# Patient Record
Sex: Male | Born: 1985 | Race: White | Hispanic: No | Marital: Single | Smoking: Former smoker
Health system: Southern US, Community
[De-identification: ages and names within clinical notes are randomized; demographics above are authoritative.]

## PROBLEM LIST (undated history)

## (undated) DIAGNOSIS — S22080A Wedge compression fracture of T11-T12 vertebra, initial encounter for closed fracture: Secondary | ICD-10-CM

## (undated) DIAGNOSIS — I10 Essential (primary) hypertension: Secondary | ICD-10-CM

## (undated) DIAGNOSIS — F329 Major depressive disorder, single episode, unspecified: Secondary | ICD-10-CM

## (undated) DIAGNOSIS — F32A Depression, unspecified: Secondary | ICD-10-CM

---

## 2006-04-18 DIAGNOSIS — S22080A Wedge compression fracture of T11-T12 vertebra, initial encounter for closed fracture: Secondary | ICD-10-CM

## 2006-04-18 HISTORY — DX: Wedge compression fracture of T11-T12 vertebra, initial encounter for closed fracture: S22.080A

## 2015-02-11 ENCOUNTER — Encounter (HOSPITAL_COMMUNITY): Payer: Self-pay | Admitting: Emergency Medicine

## 2015-02-11 ENCOUNTER — Emergency Department (HOSPITAL_COMMUNITY)
Admission: EM | Admit: 2015-02-11 | Discharge: 2015-02-11 | Disposition: A | Discharge status: Home / Self Care | Payer: Self-pay | Attending: Emergency Medicine | Admitting: Emergency Medicine

## 2015-02-11 DIAGNOSIS — I10 Essential (primary) hypertension: Secondary | ICD-10-CM | POA: Insufficient documentation

## 2015-02-11 DIAGNOSIS — M549 Dorsalgia, unspecified: Secondary | ICD-10-CM | POA: Insufficient documentation

## 2015-02-11 DIAGNOSIS — G8929 Other chronic pain: Secondary | ICD-10-CM | POA: Insufficient documentation

## 2015-02-11 DIAGNOSIS — Z880 Allergy status to penicillin: Secondary | ICD-10-CM | POA: Insufficient documentation

## 2015-02-11 DIAGNOSIS — Z720 Tobacco use: Secondary | ICD-10-CM | POA: Insufficient documentation

## 2015-02-11 DIAGNOSIS — Z8781 Personal history of (healed) traumatic fracture: Secondary | ICD-10-CM | POA: Insufficient documentation

## 2015-02-11 HISTORY — DX: Essential (primary) hypertension: I10

## 2015-02-11 HISTORY — DX: Wedge compression fracture of t11-T12 vertebra, initial encounter for closed fracture: S22.080A

## 2015-02-11 MED ORDER — METHOCARBAMOL 500 MG PO TABS: 500.0000 mg | ORAL_TABLET | Freq: Two times a day (BID) | ORAL | Status: DC | PRN

## 2015-02-11 MED ORDER — METHOCARBAMOL 500 MG PO TABS
500.0000 mg | ORAL_TABLET | Freq: Once | ORAL | Status: AC
  Administered 2015-02-11: 500 mg via ORAL
  Filled 2015-02-11: qty 1

## 2015-02-11 NOTE — ED Provider Notes (Signed)
CSN: [id number]     Arrival date & time 02/11/15  1419 History  By signing my name below, I, Placido Sou, attest that this documentation has been prepared under the direction and in the presence of [employer], PA-C. Electronically Signed: Placido Sou, ED Scribe. 02/11/2015. 4:41 PM.   Chief Complaint  Patient presents with  . Hypertension   The history is provided by the patient. No language interpreter was used.    HPI Comments: Theodore Carr is a 29 y.o. male who presents to the Emergency Department due to HTN with onset 3 months ago. Pt notes taking Norvasc and further notes it was increased from 5 mg to 10 mg 1 week ago and presents today due to consistent HTN (150/90 this morning) and information regarding alternative medications. Pt notes a hx of T12 pressure fracture in 2008. He notes currently taking elavil, Sundac, and tylenol for his symptoms which he says have provided mild relief and also requests stronger medication but denies requesting narcotics due to a hx of substance abuse. Pt notes having an appointment with [facility] on 10/28. He notes recently having been released from prison. He denies any other associated symptoms at this time.   Past Medical History  Diagnosis Date  . Hypertension   . Compression fracture of T12 vertebra (HCC) 2008   History reviewed. No pertinent past surgical history. No family history on file. Social History  Substance Use Topics  . Smoking status: Current Every Day Smoker -- 0.50 packs/day    Types: Cigarettes  . Smokeless tobacco: None  . Alcohol Use: No    Review of Systems  A complete 10 system review of systems was obtained and all systems are negative except as noted in the HPI and PMH.   Allergies  Amoxicillin  Home Medications   Prior to Admission medications   Not on File   BP 136/104 mmHg  Pulse 99  Temp(Src) 98.2 F (36.8 C) (Temporal)  Resp 18  SpO2 97% Physical Exam  Constitutional: He is oriented  to person, place, and time. He appears well-developed and well-nourished. No distress.  HENT:  Head: Normocephalic and atraumatic.  Mouth/Throat: Oropharynx is clear and moist. No oropharyngeal exudate.  Eyes: Conjunctivae and EOM are normal. Pupils are equal, round, and reactive to light.  Neck: Normal range of motion. No tracheal deviation present.  Cardiovascular: Normal rate, regular rhythm and intact distal pulses.   Pulmonary/Chest: Effort normal and breath sounds normal. No respiratory distress.  Abdominal: Soft. There is no tenderness.  Musculoskeletal: Normal range of motion.  Neurological: He is alert and oriented to person, place, and time.  No point tenderness to percussion of lumbar spinal processes.   Strength is 5 out of 5 to bilateral lower extremities at hip and knee.   Skin: Skin is warm and dry. He is not diaphoretic.  Psychiatric: He has a normal mood and affect. His behavior is normal.  Nursing note and vitals reviewed.  ED Course  Procedures  DIAGNOSTIC STUDIES: Oxygen Saturation is 97% on RA, normal by my interpretation.    COORDINATION OF CARE: 4:35 PM Pt presents today due to HTN. Discussed treatment plan with pt at bedside including muscle relaxers and recommended follow up with PCP during scheduled appointment on 10/28. Return precautions noted. Pt agreed to plan.  Labs Review Labs Reviewed - No data to display  Imaging Review No results found.   EKG Interpretation None      MDM   Final diagnoses:  HTN (hypertension) with goal to be determined  Chronic back pain    Filed Vitals:   02/11/15 1452 02/11/15 1624 02/11/15 1707  BP: 153/84 136/104 135/105  Pulse: 83 99 100  Temp: 97.6 F (36.4 C) 98.2 F (36.8 C) 97.5 F (36.4 C)  TempSrc: Oral Temporal Oral  Resp: 13 18 16   SpO2: 100% 97% 100%    Medications  methocarbamol (ROBAXIN) tablet 500 mg (500 mg Oral Given 02/11/15 1646)    [name] is 29 y.o. male presenting with elevated  blood pressure. Patient is taking Norvasc 10 mg, he has an appointment set up with primary care doctor in 2 days. No signs of endorgan damage. Patient is also reporting exacerbation of his chronic back pain.   Evaluation does not show pathology that would require ongoing emergent intervention or inpatient treatment. Pt is hemodynamically stable and mentating appropriately. Discussed findings and plan with patient/guardian, who agrees with care plan. All questions answered. Return precautions discussed and outpatient follow up given.   Discharge Medication List as of 02/11/2015  4:35 PM    START taking these medications   Details  methocarbamol (ROBAXIN) 500 MG tablet Take 1 tablet (500 mg total) by mouth 2 (two) times daily as needed for muscle spasms., Starting 02/11/2015, Until Discontinued, Print             Wynetta Emeryicole Portia Wisdom, PA-C 02/11/15 1730  Lavera Guiseana Duo Liu, MD 02/12/15 Moses Manners0025

## 2015-02-11 NOTE — Discharge Instructions (Signed)
Do not hesitate to return to the emergency room for any new, worsening or concerning symptoms.  Please obtain primary care using resource guide below. Let them know that you were seen in the emergency room and that they will need to obtain records for further outpatient management.   DASH Eating Plan DASH stands for "Dietary Approaches to Stop Hypertension." The DASH eating plan is a healthy eating plan that has been shown to reduce high blood pressure (hypertension). Additional health benefits may include reducing the risk of type 2 diabetes mellitus, heart disease, and stroke. The DASH eating plan may also help with weight loss. WHAT DO I NEED TO KNOW ABOUT THE DASH EATING PLAN? For the DASH eating plan, you will follow these general guidelines:  Choose foods with a percent daily value for sodium of less than 5% (as listed on the food label).  Use salt-free seasonings or herbs instead of table salt or sea salt.  Check with your health care provider or pharmacist before using salt substitutes.  Eat lower-sodium products, often labeled as "lower sodium" or "no salt added."  Eat fresh foods.  Eat more vegetables, fruits, and low-fat dairy products.  Choose whole grains. Look for the word "whole" as the first word in the ingredient list.  Choose fish and skinless chicken or [place] more often than red meat. Limit fish, poultry, and meat to 6 oz (170 g) each day.  Limit sweets, desserts, sugars, and sugary drinks.  Choose heart-healthy fats.  Limit cheese to 1 oz (28 g) per day.  Eat more home-cooked food and less restaurant, buffet, and fast food.  Limit fried foods.  Cook foods using methods other than frying.  Limit canned vegetables. If you do use them, rinse them well to decrease the sodium.  When eating at a restaurant, ask that your food be prepared with less salt, or no salt if possible. WHAT FOODS CAN I EAT? Seek help from a dietitian for individual calorie  needs. Grains Whole grain or whole wheat bread. Brown rice. Whole grain or whole wheat pasta. Quinoa, bulgur, and whole grain cereals. Low-sodium cereals. Corn or whole wheat flour tortillas. Whole grain cornbread. Whole grain crackers. Low-sodium crackers. Vegetables Fresh or frozen vegetables (raw, steamed, roasted, or grilled). Low-sodium or reduced-sodium tomato and vegetable juices. Low-sodium or reduced-sodium tomato sauce and paste. Low-sodium or reduced-sodium canned vegetables.  Fruits All fresh, canned (in natural juice), or frozen fruits. Meat and Other Protein Products Ground beef (85% or leaner), grass-fed beef, or beef trimmed of fat. Skinless chicken or [place]. Ground chicken or [place]. Pork trimmed of fat. All fish and seafood. Eggs. Dried beans, peas, or lentils. Unsalted nuts and seeds. Unsalted canned beans. Dairy Low-fat dairy products, such as skim or 1% milk, 2% or reduced-fat cheeses, low-fat ricotta or cottage cheese, or plain low-fat yogurt. Low-sodium or reduced-sodium cheeses. Fats and Oils Tub margarines without trans fats. Light or reduced-fat mayonnaise and salad dressings (reduced sodium). Avocado. Safflower, olive, or canola oils. Natural peanut or almond butter. Other Unsalted popcorn and pretzels. The items listed above may not be a complete list of recommended foods or beverages. Contact your dietitian for more options. WHAT FOODS ARE NOT RECOMMENDED? Grains White bread. White pasta. White rice. Refined cornbread. Bagels and croissants. Crackers that contain trans fat. Vegetables Creamed or fried vegetables. Vegetables in a cheese sauce. Regular canned vegetables. Regular canned tomato sauce and paste. Regular tomato and vegetable juices. Fruits Dried fruits. Canned fruit in light or heavy syrup.  Fruit juice. Meat and Other Protein Products Fatty cuts of meat. Ribs, chicken wings, bacon, sausage, bologna, salami, chitterlings, fatback, hot dogs, bratwurst,  and packaged luncheon meats. Salted nuts and seeds. Canned beans with salt. Dairy Whole or 2% milk, cream, half-and-half, and cream cheese. Whole-fat or sweetened yogurt. Full-fat cheeses or blue cheese. Nondairy creamers and whipped toppings. Processed cheese, cheese spreads, or cheese curds. Condiments Onion and garlic salt, seasoned salt, table salt, and sea salt. Canned and packaged gravies. Worcestershire sauce. Tartar sauce. Barbecue sauce. Teriyaki sauce. Soy sauce, including reduced sodium. Steak sauce. Fish sauce. Oyster sauce. Cocktail sauce. Horseradish. Ketchup and mustard. Meat flavorings and tenderizers. Bouillon cubes. Hot sauce. Tabasco sauce. Marinades. Taco seasonings. Relishes. Fats and Oils Butter, stick margarine, lard, shortening, ghee, and bacon fat. Coconut, palm kernel, or palm oils. Regular salad dressings. Other Pickles and olives. Salted popcorn and pretzels. The items listed above may not be a complete list of foods and beverages to avoid. Contact your dietitian for more information. WHERE CAN I FIND MORE INFORMATION? National Heart, Lung, and Blood Institute: CablePromo.itwww.nhlbi.nih.gov/health/health-topics/topics/dash/   This information is not intended to replace advice given to you by your health care provider. Make sure you discuss any questions you have with your health care provider.   Document Released: 03/24/2011 Document Revised: 04/25/2014 Document Reviewed: 02/06/2013 Elsevier Interactive Patient Education 2016 [employer]   Emergency Department Resource Guide 1) Find a Doctor and Pay Out of Pocket Although you won't have to find out who is covered by your insurance plan, it is a good idea to ask around and get recommendations. You will then need to call the office and see if the doctor you have chosen will accept you as a new patient and what types of options they offer for patients who are self-pay. Some doctors offer discounts or will set up payment plans for  their patients who do not have insurance, but you will need to ask so you aren't surprised when you get to your appointment.  2) Contact Your Local Health Department Not all health departments have doctors that can see patients for sick visits, but many do, so it is worth a call to see if yours does. If you don't know where your local health department is, you can check in your phone book. The CDC also has a tool to help you locate your state's health department, and many state websites also have listings of all of their local health departments.  3) Find a Walk-in Clinic If your illness is not likely to be very severe or complicated, you may want to try a walk in clinic. These are popping up all over the country in pharmacies, drugstores, and shopping centers. They're usually staffed by nurse practitioners or physician assistants that have been trained to treat common illnesses and complaints. They're usually fairly quick and inexpensive. However, if you have serious medical issues or chronic medical problems, these are probably not your best option.  No Primary Care Doctor: - Call Health Connect at  [phone] - they can help you locate a primary care doctor that  accepts your insurance, provides certain services, etc. - Physician Referral Service- [phone]  Chronic Pain Problems: Organization         Address  Phone   Notes  [facility]  [phone] Patients need to be referred by their primary care doctor.   Medication Assistance: Retail buyerrganization         Address  Phone  Notes  [facility] Medication [facility] [address] [place], [place] [address] [phone] --Must be a resident of [facility] -- Must have NO insurance coverage whatsoever (no Medicaid/ Medicare, etc.) -- The pt. MUST have a primary care doctor that directs their care regularly and follows them in the community   MedAssist  [phone]   [employer]  [phone]    Agencies that provide inexpensive medical care: Organization         Address  Phone   Notes  [place]  [phone]   [facility]    [phone]   [facility] [place], [place] [address] [phone]   [place] [address] [place] [phone]   Planned Parenthood    [phone]   [facility]    [phone]   [place] and [address], [place] Phone:  [phone], Fax:  [phone] Hours of Operation:  9 am - 6 pm, M-F.  Also accepts Medicaid/Medicare and self-pay.  [facility] for [place], [address], [place] Phone: [phone], Fax: [phone]. Hours of Operation:  8:30 am - 5:30 pm, M-F.  Also accepts Medicaid and self-pay.  [facility] [place] [address], [place] Phone: [phone]   [place], [place], [place] [phone], Ext. [phone] Mondays & Thursdays: 7-9 AM.  First 15 patients are seen on a first come, first serve basis.    [place] Providers:  Organization         Address  Phone   Notes  [facility] [address], [place] [phone] Also accepts self-pay patients.  [facility] [address], [place]  [phone]   [place], [address], [place] [phone]   [facility] [address] [place] [phone]   Lucianne Lei [address], [place]   [phone] Only accepts [place] Access [place] patients after they have their name applied to their card.   Self-Pay (no insurance) in [facility]:  Organization         Address  Phone   Notes  Sickle Cell Patients, [facility] Internal [facility] [place] [phone]   [facility] Urgent Care [place] [phone]   [facility] [name]  [place], [place], [place] [phone]   [facility]/Dr. Osei-Bonsu  [address], [place] or [address], [place] [phone] Phone number for both [place] and [place] locations is the same.  [facility] [address] [place] [phone]   [facility] [address] [place] or [address] [phone]   [facility] [address] [place] [phone], phone; [phone], fax Sees patients 1st and 3rd Saturday of every month.  Must not qualify for public or private insurance (i.e. Medicaid, Medicare, [place] Health Choice, Veterans' Benefits)  Household income should be no more than 200% of the poverty level The clinic cannot treat you if you are pregnant or think you are pregnant  Sexually transmitted  diseases are not treated at the clinic.    Dental Care: Organization         Address  Phone  Notes  [facility] Department of [facility] [place] [phone] Accepts children up to age 65 who are enrolled in [place] or [place]; pregnant women with a Medicaid card; and children who have applied for Medicaid or [place] Health Choice, but were declined, whose parents can pay a reduced fee at time of service.  [facility] Department of [facility]  [address], [place] [phone] Accepts children up to age 66 who are enrolled in [place] or [place]; pregnant women with a Medicaid card; and children who have applied for Medicaid or [place] Health Choice, but were declined, whose parents can pay a reduced fee at time of service.  [place] Adult Dental Access PROGRAM  [place] [phone] Patients are  seen by appointment only. Walk-ins are not accepted. [place] will see patients 64 years of age and older. Monday - Tuesday (8am-5pm) Most Wednesdays (8:30-5pm) $30 per visit, cash only  [facility] Adult Dental Access PROGRAM  [address], [name] [phone] Patients are seen by appointment only. Walk-ins are not accepted. [place] will see patients 70 years of age and older. One Wednesday Evening (Monthly: Volunteer Based).  $30 per visit, cash only  [place]  [phone] for adults; Children under age 13, call Graduate Pediatric Dentistry at [phone]. Children aged 87-14, please call [phone] to request a pediatric application.  Dental services are provided in all areas of dental care including fillings, crowns and bridges, complete and partial dentures, implants, gum treatment, root canals, and extractions. Preventive care is also provided. Treatment is provided to both adults and children. Patients are selected via a lottery and there is often a waiting list.   [facility] [address] [place]  [phone] [email or web address]   [facility] [address] [place], [place] [phone], Ext. [phone] Second and Fourth Thursday of each month, opens at 6:30 AM; Clinic ends at 9 AM.  Patients are seen on a first-come first-served basis, and a limited number are seen during each clinic.   [facility]  [address] Hillard Danker [place], [place] [phone]   Eligibility Requirements You must have lived in [place], [place], or [place] counties for at least the last three months.   You cannot be eligible for state or federal sponsored [employer], including [employer], [place], or [employer].   You generally cannot be eligible for healthcare insurance through your employer.    How to apply: Eligibility screenings are held every Tuesday and Wednesday afternoon from 1:00 pm until 4:00  pm. You do not need an appointment for the interview!  [facility] [address] [place], [place]   [place]  [place] Department  [place]  [phone]    Behavioral Health Resources in the Community: Intensive Outpatient Programs Organization         Address  Phone  Notes  [place]. [address] [place], [place] [phone]   [facility] Outpatient [address], [place], [place]   ADS: [facility] Drug S[facility] [address] [place], [place]  [place] [address],  [place], [place] or [phone]   Substance Abuse Resources Organization         Address  Phone  Notes  Alcohol and Drug Services  [phone]   [place]  [phone]   The [place]   Chinita Pester  [phone]   Residential & Outpatient Substance Abuse Program  [phone]   Psychological Services Organization         Address  Phone  Notes  [facility] [place]  [place]  [phone]   [place] [address] [place] or [phone]    Mobile Crisis Teams Organization         Address  Phone  Notes  [facility], Mobile Crisis Care Unit  [phone]   [facility]  [address] [place], [place]   [place] Levels [address] [place] [phone]    Self-Help/Support Groups Organization         Address  Phone             Notes  [place]. of [place] - variety of support groups  [place] Call for more information  Narcotics Anonymous (NA), [facility] [address], [employer] [place]  2 meetings at this location   Special educational needs teacher          Address  Phone  Notes  [facility] Residential Treatment [place],    [place]  [phone]   [facility]  [address], [place] [phone], [place], [place]   [place], [place] [phone] Admissions: 8am-3pm M-F  Incentives Substance [place] [address]    [place], [place] [phone]   [facility] [address] [place], [place], [place]   [facility] [address],  [place], [place]   [facility] Programs - Intensive Outpatient [address] Kristeen [address], [place], [place]   [facility] [place].,  [place], [place] [phone] or [phone]   [facility] Services (RTS) [address], [place], [place] Accepts Medicaid  [facility] [place] [address],  [place] [phone] Substance Abuse/Addiction Treatment   [facility] Organization         Address  Phone  Notes  [facility]  [phone]   Domenic Schwab, PhD [address] Arlis Porta [place], [place]   [phone] or [phone]   [place] Person [place], [place] [phone]   [facility] [address], [place], [place] [phone] Insurance/Medicaid/sponsorship through [facility] and Families [address] [place]                                    [place], [place] [phone] [place] [address], [place] [phone]    Dr. Adele Schilder  [phone]   [facility] Dept. 1) [address] [place] 2) [place] 3)  [place] [address], [place] [phone]  [phone]   [place] [phone] or [phone] (After Hours)

## 2015-02-11 NOTE — ED Notes (Signed)
Pt c/o hypertension x3 months taking Norvasc with no relief. Also has chronic back pain since 2008 compression fractures.

## 2015-04-01 DIAGNOSIS — Z87828 Personal history of other (healed) physical injury and trauma: Secondary | ICD-10-CM

## 2015-04-01 DIAGNOSIS — S22000A Wedge compression fracture of unspecified thoracic vertebra, initial encounter for closed fracture: Secondary | ICD-10-CM

## 2015-04-01 DIAGNOSIS — I10 Essential (primary) hypertension: Secondary | ICD-10-CM

## 2015-04-01 DIAGNOSIS — F3170 Bipolar disorder, currently in remission, most recent episode unspecified: Secondary | ICD-10-CM

## 2015-04-01 DIAGNOSIS — F1910 Other psychoactive substance abuse, uncomplicated: Secondary | ICD-10-CM

## 2015-04-01 NOTE — Congregational Nurse Program (Signed)
02/11/15 - Date of client visit and questionnaire.  Client agreed to mental health assessment.  Sleeps 3-5 hours/night. In terms of anger control client has"Impulsive reactions."  Affect congruent with mood.  Behavior is cooperative.  Client states, "If somebody does something wrong to me I can hurt people."  Client denies homicidal ideation at present.  2013 - Hospitalized times two for suicidal ideation and attempt.  No SI at present. Client given a referral [facility] the [facility].  Clinic verbalized he would go to the clinic today.  Alphonse GuildBeth Grover Robinson, RN

## 2015-05-14 DIAGNOSIS — Z139 Encounter for screening, unspecified: Secondary | ICD-10-CM

## 2015-05-15 NOTE — Congregational Nurse Program (Signed)
Congregational Nurse Program Note  Date of Encounter: 05/14/2015  Past Medical History: Past Medical History  Diagnosis Date  . Hypertension   . Compression fracture of T12 vertebra (HCC) 2008    Encounter Details:     CNP Questionnaire - 05/14/15 1540    Patient Demographics   Is this a new or existing patient? Existing   Patient is considered a/an Not Applicable  homeless   Race African-American/Black   Patient Assistance   Location of Patient Assistance Not Applicable   Patient's financial/insurance status Low Income;Self-Pay   Uninsured Patient Yes   Interventions Counseled to make appt. with provider   Patient referred to apply for the following financial assistance Marketplace or to a Navigator  seeking food stamps   Food insecurities addressed Referred to food bank or resource   Transportation assistance Yes   Assistance securing medications No  sees Pennie Banter, FNP   Educational health offerings Chronic disease;Behavioral health;Health literacy;Spiritual care;Navigating the healthcare system;Exercise/physical activity;Safety;Interpersonal relationships;Medications   Encounter Details   Primary purpose of visit Acute Illness/Condition Visit   Was an Emergency Department visit averted? Yes   Does patient have a medical provider? Yes  Pennie Banter, FNP   Patient referred to Area Agency;Clinic  [facility] [place] [facility]   Was a mental health screening completed? (GAINS tool) No   Does patient have dental issues? No   Since previous encounter, have you referred patient for abnormal blood pressure that resulted in a new diagnosis or medication change? No   Since previous encounter, have you referred patient for abnormal blood glucose that resulted in a new diagnosis or medication change? No   For Abstraction Use Only   Does patient have insurance? No       Clinic visit for B/P screening

## 2015-06-02 DIAGNOSIS — Z139 Encounter for screening, unspecified: Secondary | ICD-10-CM

## 2015-06-09 DIAGNOSIS — Z139 Encounter for screening, unspecified: Secondary | ICD-10-CM

## 2015-06-09 NOTE — Congregational Nurse Program (Signed)
Congregational Nurse Program Note  Date of Encounter: 06/02/2015  Past Medical History: Past Medical History  Diagnosis Date  . Hypertension   . Compression fracture of T12 vertebra (HCC) 2008    Encounter Details:     CNP Questionnaire - 06/02/15 1358    Patient Demographics   Is this a new or existing patient? Existing   Patient is considered a/an Not Applicable  homeless   Race African-American/Black   Patient Assistance   Location of Patient Assistance Not Applicable   Patient's financial/insurance status Low Income;Self-Pay   Uninsured Patient Yes   Interventions Counseled to make appt. with provider   Patient referred to apply for the following financial assistance Marketplace or to a Navigator  seeking food stamps   Food insecurities addressed Referred to food bank or resource   Transportation assistance Yes   Assistance securing medications No  sees Pennie Banter, FNP   Educational health offerings Chronic disease;Behavioral health;Health literacy;Spiritual care;Navigating the healthcare system;Exercise/physical activity;Safety;Interpersonal relationships;Medications   Encounter Details   Primary purpose of visit Acute Illness/Condition Visit   Was an Emergency Department visit averted? Yes   Does patient have a medical provider? Yes  Pennie Banter, FNP   Patient referred to Area Agency;Clinic  [facility] [place] [facility]   Was a mental health screening completed? (GAINS tool) No   Does patient have dental issues? No   Does patient have vision issues? No   Since previous encounter, have you referred patient for abnormal blood pressure that resulted in a new diagnosis or medication change? No   Since previous encounter, have you referred patient for abnormal blood glucose that resulted in a new diagnosis or medication change? No   For Abstraction Use Only   Does patient have insurance? No       B/P check.  146/96.  Had just  smoked a cigarette 10 minutes prior to screening.  Discussed need to decrease smoking with a goal of stopping smoking.

## 2015-06-09 NOTE — Congregational Nurse Program (Signed)
Congregational Nurse Program Note  Date of Encounter: 06/09/2015  Past Medical History: Past Medical History  Diagnosis Date  . Hypertension   . Compression fracture of T12 vertebra (HCC) 2008    Encounter Details:     CNP Questionnaire - 06/09/15 1402    Patient Demographics   Is this a new or existing patient? Existing   Patient is considered a/an Not Applicable  homeless   Race African-American/Black   Patient Assistance   Location of Patient Assistance Not Applicable   Patient's financial/insurance status Low Income;Self-Pay   Uninsured Patient Yes   Interventions Counseled to make appt. with provider   Patient referred to apply for the following financial assistance Marketplace or to a Navigator  seeking food stamps   Food insecurities addressed Referred to food bank or resource   Transportation assistance Yes   Assistance securing medications No  sees Pennie Banter, FNP   Educational health offerings Chronic disease;Behavioral health;Health literacy;Spiritual care;Navigating the healthcare system;Exercise/physical activity;Safety;Interpersonal relationships;Medications   Encounter Details   Primary purpose of visit Acute Illness/Condition Visit   Was an Emergency Department visit averted? Yes   Does patient have a medical provider? Yes  Pennie Banter, FNP   Patient referred to Area Agency;Clinic  [facility] [place] [facility]   Was a mental health screening completed? (GAINS tool) No   Does patient have dental issues? No   Does patient have vision issues? No   Since previous encounter, have you referred patient for abnormal blood pressure that resulted in a new diagnosis or medication change? No   Since previous encounter, have you referred patient for abnormal blood glucose that resulted in a new diagnosis or medication change? No   For Abstraction Use Only   Does patient have insurance? No       B/P check.  Has been to see  Lavinia Sharps NP at the clinic this am.  Medications prescribed, does not know what they all are.

## 2015-06-23 DIAGNOSIS — Z139 Encounter for screening, unspecified: Secondary | ICD-10-CM

## 2015-06-25 NOTE — Congregational Nurse Program (Signed)
Congregational Nurse Program Note  Date of Encounter: 06/23/2015  Past Medical History: Past Medical History  Diagnosis Date  . Hypertension   . Compression fracture of T12 vertebra (HCC) 2008    Encounter Details:     CNP Questionnaire - 06/23/15 0926    Patient Demographics   Is this a new or existing patient? Existing   Patient is considered a/an Not Applicable  homeless   Race African-American/Black   Patient Assistance   Location of Patient Assistance Not Applicable   Patient's financial/insurance status Low Income;Self-Pay   Uninsured Patient Yes   Interventions Counseled to make appt. with provider   Patient referred to apply for the following financial assistance Marketplace or to a Navigator  seeking food stamps   Food insecurities addressed Provided food supplies   Transportation assistance No   Assistance securing medications No  sees Pennie BanterMaryann [name], FNP   Educational health offerings Chronic disease;Spiritual care;Navigating the healthcare system;Exercise/physical activity;Safety;Medications   Encounter Details   Primary purpose of visit Acute Illness/Condition Visit;Chronic Illness/Condition Visit   Was an Emergency Department visit averted? Not Applicable   Does patient have a medical provider? Yes  Pennie BanterMaryann [name], FNP   Patient referred to Not Applicable  [facility]   Was a mental health screening completed? (GAINS tool) No   Does patient have dental issues? No   Does patient have vision issues? No   Since previous encounter, have you referred patient for abnormal blood pressure that resulted in a new diagnosis or medication change? No   Since previous encounter, have you referred patient for abnormal blood glucose that resulted in a new diagnosis or medication change? No   For Abstraction Use Only   Does patient have insurance? No       B/P check.  130/88

## 2015-07-15 ENCOUNTER — Emergency Department (HOSPITAL_COMMUNITY)
Admission: EM | Admit: 2015-07-15 | Discharge: 2015-07-16 | Disposition: A | Discharge status: Other Acute Inpatient Hospital | Payer: Self-pay

## 2015-07-15 ENCOUNTER — Encounter (HOSPITAL_COMMUNITY): Payer: Self-pay | Admitting: Emergency Medicine

## 2015-07-15 DIAGNOSIS — R112 Nausea with vomiting, unspecified: Secondary | ICD-10-CM | POA: Insufficient documentation

## 2015-07-15 DIAGNOSIS — Z8781 Personal history of (healed) traumatic fracture: Secondary | ICD-10-CM | POA: Insufficient documentation

## 2015-07-15 DIAGNOSIS — R45851 Suicidal ideations: Secondary | ICD-10-CM | POA: Insufficient documentation

## 2015-07-15 DIAGNOSIS — Z880 Allergy status to penicillin: Secondary | ICD-10-CM | POA: Insufficient documentation

## 2015-07-15 DIAGNOSIS — I10 Essential (primary) hypertension: Secondary | ICD-10-CM | POA: Insufficient documentation

## 2015-07-15 DIAGNOSIS — Y9289 Other specified places as the place of occurrence of the external cause: Secondary | ICD-10-CM | POA: Insufficient documentation

## 2015-07-15 DIAGNOSIS — Z79899 Other long term (current) drug therapy: Secondary | ICD-10-CM | POA: Insufficient documentation

## 2015-07-15 DIAGNOSIS — F1410 Cocaine abuse, uncomplicated: Secondary | ICD-10-CM | POA: Insufficient documentation

## 2015-07-15 DIAGNOSIS — Y9389 Activity, other specified: Secondary | ICD-10-CM | POA: Insufficient documentation

## 2015-07-15 DIAGNOSIS — F17210 Nicotine dependence, cigarettes, uncomplicated: Secondary | ICD-10-CM | POA: Insufficient documentation

## 2015-07-15 DIAGNOSIS — Y998 Other external cause status: Secondary | ICD-10-CM | POA: Insufficient documentation

## 2015-07-15 LAB — RAPID URINE DRUG SCREEN, HOSP PERFORMED
Amphetamines: NOT DETECTED
BARBITURATES: NOT DETECTED
Benzodiazepines: NOT DETECTED
COCAINE: POSITIVE — AB
OPIATES: NOT DETECTED
Tetrahydrocannabinol: NOT DETECTED

## 2015-07-15 LAB — CBG MONITORING, ED: Glucose-Capillary: 93 mg/dL (ref 65–99)

## 2015-07-15 MED ORDER — ONDANSETRON 4 MG PO TBDP
4.0000 mg | ORAL_TABLET | Freq: Once | ORAL | Status: AC | PRN
  Administered 2015-07-15: 4 mg via ORAL
  Filled 2015-07-15: qty 1

## 2015-07-15 MED ORDER — ONDANSETRON HCL 4 MG/2ML IJ SOLN
4.0000 mg | Freq: Once | INTRAMUSCULAR | Status: AC
  Administered 2015-07-16: 4 mg via INTRAVENOUS
  Filled 2015-07-15: qty 2

## 2015-07-15 MED ORDER — SODIUM CHLORIDE 0.9 % IV BOLUS (SEPSIS)
1000.0000 mL | Freq: Once | INTRAVENOUS | Status: AC
  Administered 2015-07-16: 1000 mL via INTRAVENOUS

## 2015-07-15 MED ORDER — KETOROLAC TROMETHAMINE 30 MG/ML IJ SOLN
30.0000 mg | Freq: Once | INTRAMUSCULAR | Status: AC
  Administered 2015-07-16: 30 mg via INTRAVENOUS
  Filled 2015-07-15: qty 1
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
## 2015-07-16 ENCOUNTER — Encounter (HOSPITAL_COMMUNITY): Payer: Self-pay | Admitting: *Deleted

## 2015-07-16 ENCOUNTER — Inpatient Hospital Stay (HOSPITAL_COMMUNITY)
Admission: EM | Admit: 2015-07-16 | Discharge: 2015-07-22 | DRG: 897 | Disposition: A | Discharge status: Home / Self Care | Payer: Federal, State, Local not specified - Other | Source: Intra-hospital | Attending: Psychiatry | Admitting: Psychiatry

## 2015-07-16 DIAGNOSIS — F1414 Cocaine abuse with cocaine-induced mood disorder: Secondary | ICD-10-CM | POA: Diagnosis not present

## 2015-07-16 DIAGNOSIS — G4700 Insomnia, unspecified: Secondary | ICD-10-CM | POA: Diagnosis present

## 2015-07-16 DIAGNOSIS — Z79899 Other long term (current) drug therapy: Secondary | ICD-10-CM

## 2015-07-16 DIAGNOSIS — M62838 Other muscle spasm: Secondary | ICD-10-CM | POA: Diagnosis present

## 2015-07-16 DIAGNOSIS — R45851 Suicidal ideations: Secondary | ICD-10-CM | POA: Diagnosis present

## 2015-07-16 DIAGNOSIS — F1424 Cocaine dependence with cocaine-induced mood disorder: Principal | ICD-10-CM | POA: Diagnosis present

## 2015-07-16 DIAGNOSIS — K219 Gastro-esophageal reflux disease without esophagitis: Secondary | ICD-10-CM | POA: Diagnosis present

## 2015-07-16 DIAGNOSIS — F410 Panic disorder [episodic paroxysmal anxiety] without agoraphobia: Secondary | ICD-10-CM | POA: Diagnosis present

## 2015-07-16 DIAGNOSIS — F1920 Other psychoactive substance dependence, uncomplicated: Secondary | ICD-10-CM

## 2015-07-16 DIAGNOSIS — F329 Major depressive disorder, single episode, unspecified: Secondary | ICD-10-CM | POA: Diagnosis present

## 2015-07-16 DIAGNOSIS — I10 Essential (primary) hypertension: Secondary | ICD-10-CM | POA: Diagnosis present

## 2015-07-16 DIAGNOSIS — F17210 Nicotine dependence, cigarettes, uncomplicated: Secondary | ICD-10-CM | POA: Diagnosis present

## 2015-07-16 DIAGNOSIS — F1120 Opioid dependence, uncomplicated: Secondary | ICD-10-CM

## 2015-07-16 HISTORY — DX: Opioid dependence, uncomplicated: F11.20

## 2015-07-16 HISTORY — DX: Cocaine abuse with cocaine-induced mood disorder: F14.14

## 2015-07-16 HISTORY — DX: Other psychoactive substance dependence, uncomplicated: F19.20

## 2015-07-16 LAB — COMPREHENSIVE METABOLIC PANEL
ALT: 21 U/L (ref 17–63)
AST: 20 U/L (ref 15–41)
Albumin: 5.1 g/dL — ABNORMAL HIGH (ref 3.5–5.0)
Alkaline Phosphatase: 77 U/L (ref 38–126)
Anion gap: 12 (ref 5–15)
BUN: 14 mg/dL (ref 6–20)
CHLORIDE: 109 mmol/L (ref 101–111)
CO2: 22 mmol/L (ref 22–32)
CREATININE: 1.4 mg/dL — AB (ref 0.61–1.24)
Calcium: 10.1 mg/dL (ref 8.9–10.3)
GFR calc Af Amer: >60 mL/min (ref >60)
GFR calc non Af Amer: >60 mL/min (ref >60)
Glucose, Bld: 108 mg/dL — ABNORMAL HIGH (ref 65–99)
Potassium: 3.4 mmol/L — ABNORMAL LOW (ref 3.5–5.1)
SODIUM: 143 mmol/L (ref 135–145)
Total Bilirubin: 1.1 mg/dL (ref 0.3–1.2)
Total Protein: 7.8 g/dL (ref 6.5–8.1)

## 2015-07-16 LAB — CBC
HEMATOCRIT: 44.4 % (ref 39.0–52.0)
Hemoglobin: 15.9 g/dL (ref 13.0–17.0)
MCH: 30.8 pg (ref 26.0–34.0)
MCHC: 35.8 g/dL (ref 30.0–36.0)
MCV: 86 fL (ref 78.0–100.0)
Platelets: 313 10*3/uL (ref 150–400)
RBC: 5.16 MIL/uL (ref 4.22–5.81)
RDW: 13.4 % (ref 11.5–15.5)
WBC: 22.8 10*3/uL — ABNORMAL HIGH (ref 4.0–10.5)

## 2015-07-16 LAB — ETHANOL: Alcohol, Ethyl (B): <5 mg/dL (ref <5)

## 2015-07-16 LAB — SALICYLATE LEVEL: Salicylate Lvl: <4 mg/dL (ref 2.8–30.0)

## 2015-07-16 LAB — ACETAMINOPHEN LEVEL: Acetaminophen (Tylenol), Serum: <10 ug/mL — ABNORMAL LOW (ref 10–30)

## 2015-07-16 MED ORDER — QUETIAPINE FUMARATE 200 MG PO TABS
200.0000 mg | ORAL_TABLET | Freq: Every day | ORAL | Status: DC
  Administered 2015-07-16 – 2015-07-19 (×4): 200 mg via ORAL
  Filled 2015-07-16 (×5): qty 1

## 2015-07-16 MED ORDER — CLONIDINE HCL 0.1 MG PO TABS
0.1000 mg | ORAL_TABLET | Freq: Two times a day (BID) | ORAL | Status: DC
  Administered 2015-07-16 – 2015-07-17 (×2): 0.1 mg via ORAL
  Filled 2015-07-16 (×5): qty 1

## 2015-07-16 MED ORDER — IBUPROFEN 200 MG PO TABS
600.0000 mg | ORAL_TABLET | Freq: Three times a day (TID) | ORAL | Status: DC | PRN
Start: 2015-07-16 — End: 2015-07-16

## 2015-07-16 MED ORDER — METOPROLOL SUCCINATE 12.5 MG HALF TABLET
12.5000 mg | ORAL_TABLET | Freq: Every day | ORAL | Status: DC
  Administered 2015-07-16: 12.5 mg via ORAL
  Filled 2015-07-16: qty 1

## 2015-07-16 MED ORDER — ALUM & MAG HYDROXIDE-SIMETH 200-200-20 MG/5ML PO SUSP
30.0000 mL | ORAL | Status: DC | PRN
  Administered 2015-07-19 (×2): 30 mL via ORAL
  Filled 2015-07-16 (×2): qty 30

## 2015-07-16 MED ORDER — ACETAMINOPHEN 325 MG PO TABS: 650.0000 mg | ORAL_TABLET | ORAL | Status: DC | PRN

## 2015-07-16 MED ORDER — ZOLPIDEM TARTRATE 5 MG PO TABS
5.0000 mg | ORAL_TABLET | Freq: Every evening | ORAL | Status: DC | PRN
Start: 2015-07-16 — End: 2015-07-16

## 2015-07-16 MED ORDER — DIAZEPAM 5 MG PO TABS
5.0000 mg | ORAL_TABLET | Freq: Two times a day (BID) | ORAL | Status: DC | PRN
  Administered 2015-07-16 – 2015-07-19 (×6): 5 mg via ORAL
  Filled 2015-07-16 (×6): qty 1

## 2015-07-16 MED ORDER — GABAPENTIN 400 MG PO CAPS
800.0000 mg | ORAL_CAPSULE | Freq: Three times a day (TID) | ORAL | Status: DC
  Administered 2015-07-16: 800 mg via ORAL
  Filled 2015-07-16: qty 2

## 2015-07-16 MED ORDER — BUPROPION HCL ER (SR) 100 MG PO TB12
100.0000 mg | ORAL_TABLET | Freq: Two times a day (BID) | ORAL | Status: DC
  Administered 2015-07-16 – 2015-07-22 (×12): 100 mg via ORAL
  Filled 2015-07-16 (×9): qty 1
  Filled 2015-07-16: qty 14
  Filled 2015-07-16 (×6): qty 1

## 2015-07-16 MED ORDER — IBUPROFEN 800 MG PO TABS
800.0000 mg | ORAL_TABLET | Freq: Four times a day (QID) | ORAL | Status: DC | PRN
  Administered 2015-07-16: 800 mg via ORAL
  Filled 2015-07-16: qty 1

## 2015-07-16 MED ORDER — NICOTINE 21 MG/24HR TD PT24
21.0000 mg | MEDICATED_PATCH | Freq: Every day | TRANSDERMAL | Status: DC
  Administered 2015-07-16: 21 mg via TRANSDERMAL
  Filled 2015-07-16: qty 1

## 2015-07-16 MED ORDER — BUPROPION HCL ER (SR) 100 MG PO TB12
100.0000 mg | ORAL_TABLET | Freq: Two times a day (BID) | ORAL | Status: DC
  Administered 2015-07-16: 100 mg via ORAL
  Filled 2015-07-16 (×2): qty 1

## 2015-07-16 MED ORDER — ACETAMINOPHEN 325 MG PO TABS: 650.0000 mg | ORAL_TABLET | Freq: Four times a day (QID) | ORAL | Status: DC | PRN

## 2015-07-16 MED ORDER — TRAZODONE HCL 50 MG PO TABS
50.0000 mg | ORAL_TABLET | Freq: Every day | ORAL | Status: DC
  Administered 2015-07-16: 50 mg via ORAL
  Filled 2015-07-16 (×2): qty 1

## 2015-07-16 MED ORDER — GABAPENTIN 800 MG PO TABS
800.0000 mg | ORAL_TABLET | Freq: Three times a day (TID) | ORAL | Status: DC
  Administered 2015-07-16 – 2015-07-22 (×18): 800 mg via ORAL
  Filled 2015-07-16 (×26): qty 1

## 2015-07-16 MED ORDER — GABAPENTIN 800 MG PO TABS: 800.0000 mg | ORAL_TABLET | Freq: Three times a day (TID) | ORAL | Status: DC

## 2015-07-16 MED ORDER — ONDANSETRON HCL 4 MG PO TABS: 4.0000 mg | ORAL_TABLET | Freq: Three times a day (TID) | ORAL | Status: DC | PRN

## 2015-07-16 MED ORDER — LORAZEPAM 1 MG PO TABS
1.0000 mg | ORAL_TABLET | Freq: Three times a day (TID) | ORAL | Status: DC | PRN
  Administered 2015-07-16: 1 mg via ORAL
  Filled 2015-07-16: qty 1

## 2015-07-16 MED ORDER — MAGNESIUM HYDROXIDE 400 MG/5ML PO SUSP: 30.0000 mL | Freq: Every day | ORAL | Status: DC | PRN

## 2015-07-16 MED ORDER — PROMETHAZINE HCL 25 MG PO TABS: 25.0000 mg | ORAL_TABLET | Freq: Four times a day (QID) | ORAL | Status: DC | PRN

## 2015-07-16 MED ORDER — BACLOFEN 10 MG PO TABS
10.0000 mg | ORAL_TABLET | Freq: Three times a day (TID) | ORAL | Status: DC
  Administered 2015-07-16 – 2015-07-22 (×17): 10 mg via ORAL
  Filled 2015-07-16 (×22): qty 1

## 2015-07-16 MED ORDER — CLONIDINE HCL 0.1 MG PO TABS
0.2000 mg | ORAL_TABLET | Freq: Every day | ORAL | Status: DC
  Administered 2015-07-16 (×2): 0.2 mg via ORAL
  Filled 2015-07-16 (×2): qty 2

## 2015-07-16 MED ORDER — IBUPROFEN 800 MG PO TABS
800.0000 mg | ORAL_TABLET | Freq: Four times a day (QID) | ORAL | Status: DC | PRN
  Administered 2015-07-18 – 2015-07-21 (×4): 800 mg via ORAL
  Filled 2015-07-16 (×4): qty 1

## 2015-07-16 MED ORDER — NALTREXONE HCL 50 MG PO TABS
50.0000 mg | ORAL_TABLET | Freq: Every day | ORAL | Status: DC
  Administered 2015-07-16: 50 mg via ORAL
  Filled 2015-07-16: qty 1

## 2015-07-16 MED ORDER — QUETIAPINE FUMARATE 100 MG PO TABS: 200.0000 mg | ORAL_TABLET | Freq: Every day | ORAL | Status: DC

## 2015-07-16 MED ORDER — ALUM & MAG HYDROXIDE-SIMETH 200-200-20 MG/5ML PO SUSP
30.0000 mL | Freq: Once | ORAL | Status: AC
  Administered 2015-07-16: 30 mL via ORAL
  Filled 2015-07-16: qty 30

## 2015-07-16 MED ORDER — CYCLOBENZAPRINE HCL 10 MG PO TABS: 10.0000 mg | ORAL_TABLET | Freq: Three times a day (TID) | ORAL | Status: DC | PRN

## 2015-07-16 MED ORDER — HYDROXYZINE HCL 50 MG PO TABS
50.0000 mg | ORAL_TABLET | Freq: Once | ORAL | Status: DC
  Filled 2015-07-16 (×2): qty 1

## 2015-07-16 MED ORDER — NICOTINE 21 MG/24HR TD PT24
21.0000 mg | MEDICATED_PATCH | Freq: Every day | TRANSDERMAL | Status: DC
  Administered 2015-07-17 – 2015-07-21 (×5): 21 mg via TRANSDERMAL
  Filled 2015-07-16 (×7): qty 1

## 2015-07-16 NOTE — Tx Team (Signed)
Initial Interdisciplinary Treatment Plan   PATIENT STRESSORS: Health problems Legal issue Marital or family conflict Medication change or noncompliance Substance abuse   PATIENT STRENGTHS: Ability for insight Communication skills Motivation for treatment/growth Supportive family/friends   PROBLEM LIST: Problem List/Patient Goals Date to be addressed Date deferred Reason deferred Estimated date of resolution  At risk for suicide 07/16/2015  07/16/2015   D/C  Substance abuse 07/16/2015  07/16/2015   D/C  Depression 07/16/2015  07/16/2015   D/C  "Anxiety and the way I feel" 07/16/2015  07/16/2015   D/C  "Managing my medications" 07/16/2015  07/16/2015   D/C  "Learning to build and keep relationships" 07/16/2015  07/16/2015   D/C                     DISCHARGE CRITERIA:  Adequate post-discharge living arrangements Improved stabilization in mood, thinking, and/or behavior Medical problems require only outpatient monitoring Motivation to continue treatment in a less acute level of care Need for constant or close observation no longer present Withdrawal symptoms are absent or subacute and managed without 24-hour nursing intervention  PRELIMINARY DISCHARGE PLAN: Attend 12-step recovery group Outpatient therapy Placement in alternative living arrangements  PATIENT/FAMIILY INVOLVEMENT: This treatment plan has been presented to and reviewed with the patient, Theodore Carr.  The patient and family have been given the opportunity to ask questions and make suggestions.  Theodore Carr, Theodore Carr 07/16/2015, 6:14 PM

## 2015-07-16 NOTE — [facility] Group Notes (Addendum)
[facility] LCSW Group Therapy 07/16/2015 1:15 PM Type of Therapy: Group Therapy Participation Level: Minimal  Participation Quality: Limited, Disengaged  Affect: Depressed and Flat; lethargic  Cognitive: Alert and Oriented  Insight: Developing/Improving and Engaged  Engagement in Therapy: Developing/Improving and Engaged  Modes of Intervention: Activity, Clarification, Confrontation, Discussion, Education, Exploration, Limit-setting, Orientation, Problem-solving, Rapport Building, Dance movement psychotherapisteality Testing, Socialization and Support  Summary of Progress/Problems: Patient was attentive and engaged with speaker from [employer]. Patient was attentive to speaker while they shared their story of dealing with mental health and overcoming it. Patient expressed interest in their programs and services and received information on their agency. Patient processed ways they can relate to the speaker. Patient entered group and was observed sleeping during presentation.  Samuella BruinKristin Breylin Dom, LCSW Clinical Social Worker [facility] [phone]

## 2015-07-16 NOTE — BH Assessment (Signed)
Assessment completed. Consulted Donell SievertSpencer Simon, PA-C who recommended inpatient treatment. TTT to seek placement. Informed Elpidio AnisShari Upstill, PA-C of the recommendation.

## 2015-07-16 NOTE — ED Notes (Signed)
Sitter at bedside.
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
## 2015-07-16 NOTE — ED Notes (Signed)
[facility] refused to take report. [place] SpanielSaid they would take report at [phone].

## 2015-07-16 NOTE — Progress Notes (Addendum)
Attempted to discharge patient, he stated " if I am discharged I will attempt to hang myself, notified Dr. Patria Maneampos. Dr. Charline BillsStated attempt to discharge and see what happens, patient denied  self harm when first arriving to ED.

## 2015-07-16 NOTE — BH Assessment (Signed)
Staff entered the room to  informed patient that the MD, was going to discharge him. Patient stood up on the stretcher, grabbed the sheet wrapped it around his neck, and attempted to place over the light, notified security, charge nurse and this Clinical research associatewriter at bedside.  Security arrived removed blanket, and had patient sat down on strectcher. Dr. Patria Maneampos notified, new orders noted and received.

## 2015-07-16 NOTE — Discharge Instructions (Signed)
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
## 2015-07-16 NOTE — BH Assessment (Signed)
Pt has been accepted to [facility] Bed 1 (Dr. Dub MikesLugo). Informed Dr. Patria Maneampos of the disposition.
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
## 2015-07-17 MED ORDER — CLONIDINE HCL 0.1 MG PO TABS
0.0500 mg | ORAL_TABLET | Freq: Two times a day (BID) | ORAL | Status: DC
  Administered 2015-07-17 – 2015-07-19 (×4): 0.05 mg via ORAL
  Filled 2015-07-17 (×8): qty 0.5

## 2015-07-17 NOTE — Progress Notes (Signed)
Recreation Therapy Notes  Date: 03.31.2017 Time: 9:30am Location: [address] Hall Group Room   Group Topic: Stress Management  Goal Area(s) Addresses:  Patient will actively participate in stress management techniques presented during session.   Behavioral Response: Appropriate, Engaged   Intervention: Stress management techniques  Activity :  Deep Breathing and Guided Imagery. LRT provided education, instruction and demonstration on practice of Deep Breathing and Guided Imagery. Patient was asked to participate in technique introduced during session.   Education:  Stress Management, Discharge Planning.   Education Outcome: Acknowledges education/In group clarification offered/Needs additional education  Clinical Observations/Feedback: Patient actively engaged in technique introduced, expressed no concerns and demonstrated ability to practice independently post d/c.   Marykay Lexenise L Ayaat Jansma, LRT/CTRS        Theodore Carr L 07/17/2015 2:10 PM
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
## 2015-07-17 NOTE — Progress Notes (Signed)
D: Patient observed watching TV and interacting well with peers on approach. Denies  SI/HI/AVH and pain.No behavioral issues noted.  A: Support and encouragement offered as needed. Medications administered as prescribed.  R: Patient cooperative and appropriate on unit. Will continue to monitor patient for safety and stability.

## 2015-07-17 NOTE — Progress Notes (Signed)
D:Pt came back from lunch irritable and upset because staff did not allow him to bring an open drink back from the cafeteria.  A:Attempted to explain why the drinks are not allowed and pt said that he would sneak a drink on the unit. Pt is demanding at times asking writer to stop and take him outside as Probation officer was working with a pt to complete an admission. Pt easily angers if his wants are not met immediately.   R:Safety maintained on the unit.

## 2015-07-17 NOTE — Progress Notes (Signed)
Patient attended wrap-up group and rated his day good at a 10. Goal was to attend every meeting, to listen and share and he did that. Also talked to doctor about medications

## 2015-07-17 NOTE — [facility] Group Notes (Signed)
   [facility]  07/17/2015  8:45 AM   Participation Quality: Alert, Appropriate and Oriented  Mood/Affect: Depressed and Flat  Depression Rating: Reports high levels of depression   Anxiety Rating: Reports high levels of anxiety  Thoughts of Suicide: Pt denies SI/HI  Will you contract for safety? Yes  Current AVH: Pt denies  Plan for Discharge/Comments: Pt attended discharge planning group and actively participated in group. CSW provided pt with today's workbook. Patient reports not feeling well. CSW continuing to assess his discharge plans.  Transportation Means: CSW continuing to assess  Supports: No supports mentioned at this time  Samuella BruinKristin Sadhana Frater, MSW, Johnson & JohnsonLCSW Clinical Social Worker [facility] [phone]

## 2015-07-17 NOTE — Progress Notes (Signed)
NUTRITION ASSESSMENT  Pt identified as at risk on the Malnutrition Screen Tool  INTERVENTION: 1. Educated patient on the importance of nutrition and encouraged intake of food and beverages. 2. Discussed weight goals. 3. Supplements: none at this time  NUTRITION DIAGNOSIS: Unintentional weight loss related to sub-optimal intake as evidenced by pt report.   Goal: Pt to meet >/= 90% of their estimated nutrition needs.  Monitor:  PO intake  Assessment:  Pt admitted for schizoaffective disorder and threat of suicide. Per notes, pt with decreased appetite and associated weight loss PTA. No previous weight hx in the chart. No nutrition supplements warranted at this time but will continue to monitor.  30 y.o. male  Height: Ht Readings from Last 1 Encounters:  07/16/15 5' 6.75" (1.695 m)    Weight: Wt Readings from Last 1 Encounters:  07/16/15 153 lb (69.4 kg)    Weight Hx: Wt Readings from Last 10 Encounters:  07/16/15 153 lb (69.4 kg)    BMI:  Body mass index is 24.16 kg/(m^2). Pt meets criteria for normal weight based on current BMI.  Estimated Nutritional Needs: Kcal: 25-30 kcal/kg Protein: > 1 gram protein/kg Fluid: 1 ml/kcal  Diet Order: Diet regular Room service appropriate?: Yes; Fluid consistency:: Thin Pt is also offered choice of unit snacks mid-morning and mid-afternoon.  Pt is eating as desired.   Lab results and medications reviewed.      Trenton GammonJessica Dreamer Carillo, RD, LDN Inpatient Clinical Dietitian Pager # [phone] After hours/weekend pager # [phone]

## 2015-07-17 NOTE — Progress Notes (Signed)
[facility] MD Progress Note  07/17/2015 2:03 PM Theodore Carr  MRN:  [id number] Subjective:  Theodore Carr states he wants to get his life back together. He continues to endorse that he ruminates about all the negative things he has done. He admits he cant belief he has led cocaine ruin his life. He is enrolled in school he has plans to get himself straight for his sake and his kid. States he wants to keep himself focused.  Principal Problem: Cocaine abuse with cocaine-induced mood disorder ([place]) Diagnosis:   Patient Active Problem List   Diagnosis Date Noted  . Polysubstance dependence including opioid drug with daily use ([place]) [F19.20] 07/16/2015  . Cocaine abuse with cocaine-induced mood disorder [facility]) [F14.14] 07/16/2015   Total Time spent with patient: 20 minutes  Past Psychiatric History: see admission H and P  Past Medical History:  Past Medical History  Diagnosis Date  . Hypertension   . Compression fracture of T12 vertebra ([place]) 2008   History reviewed. No pertinent past surgical history. Family History: History reviewed. No pertinent family history. Family Psychiatric  History: see admission H and P Social History:  History  Alcohol Use No     History  Drug Use No    Comment: not in 2 years    Social History   Social History  . Marital Status: Single    Spouse Name: N/A  . Number of Children: N/A  . Years of Education: N/A   Social History Main Topics  . Smoking status: Current Every Day Smoker -- 0.50 packs/day    Types: Cigarettes  . Smokeless tobacco: None  . Alcohol Use: No  . Drug Use: No     Comment: not in 2 years  . Sexual Activity: Yes   Other Topics Concern  . None   Social History Narrative   Additional Social History:                         Sleep: Fair  Appetite:  Fair  Current Medications: Current Facility-Administered Medications  Medication Dose Route Frequency Provider Last Rate Last Dose  . acetaminophen (TYLENOL) tablet 650 mg   650 mg Oral Q6H PRN Encarnacion Slates, NP      . alum & mag hydroxide-simeth (MAALOX/MYLANTA) 200-200-20 MG/5ML suspension 30 mL  30 mL Oral Q4H PRN Encarnacion Slates, NP      . baclofen (LIORESAL) tablet 10 mg  10 mg Oral TID Nicholaus Bloom, MD   10 mg at 07/17/15 1202  . buPROPion (WELLBUTRIN SR) 12 hr tablet 100 mg  100 mg Oral BID Nicholaus Bloom, MD   100 mg at 07/17/15 0853  . cloNIDine (CATAPRES) tablet 0.05 mg  0.05 mg Oral BID Nicholaus Bloom, MD      . diazepam (VALIUM) tablet 5 mg  5 mg Oral BID PRN Nicholaus Bloom, MD   5 mg at 07/17/15 0857  . gabapentin (NEURONTIN) tablet 800 mg  800 mg Oral TID Encarnacion Slates, NP   800 mg at 07/17/15 1202  . ibuprofen (ADVIL,MOTRIN) tablet 800 mg  800 mg Oral Q6H PRN Nicholaus Bloom, MD      . magnesium hydroxide (MILK OF MAGNESIA) suspension 30 mL  30 mL Oral Daily PRN Encarnacion Slates, NP      . nicotine (NICODERM CQ - dosed in mg/24 hours) patch 21 mg  21 mg Transdermal Q0600 Encarnacion Slates, NP   21 mg at  07/17/15 0853  . QUEtiapine (SEROQUEL) tablet 200 mg  200 mg Oral QHS Nicholaus Bloom, MD   200 mg at 07/16/15 2130    Lab Results:  Results for orders placed or performed during the hospital encounter of 07/15/15 (from the past 48 hour(s))  Urine rapid drug screen (hosp performed) (Not at [facility])     Status: Abnormal   Collection Time: 07/15/15 10:31 PM  Result Value Ref Range   Opiates NONE DETECTED NONE DETECTED   Cocaine POSITIVE (A) NONE DETECTED   Benzodiazepines NONE DETECTED NONE DETECTED   Amphetamines NONE DETECTED NONE DETECTED   Tetrahydrocannabinol NONE DETECTED NONE DETECTED   Barbiturates NONE DETECTED NONE DETECTED    Comment:        DRUG SCREEN FOR MEDICAL PURPOSES ONLY.  IF CONFIRMATION IS NEEDED FOR ANY PURPOSE, NOTIFY LAB WITHIN 5 DAYS.        LOWEST DETECTABLE LIMITS FOR URINE DRUG SCREEN Drug Class       Cutoff (ng/mL) Amphetamine      1000 Barbiturate      200 Benzodiazepine   379 Tricyclics       024 Opiates          300 Cocaine           300 THC              50   CBG monitoring, ED     Status: None   Collection Time: 07/15/15 11:28 PM  Result Value Ref Range   Glucose-Capillary 93 65 - 99 mg/dL   Comment 1 Notify RN   Comprehensive metabolic panel     Status: Abnormal   Collection Time: 07/15/15 11:46 PM  Result Value Ref Range   Sodium 143 135 - 145 mmol/L   Potassium 3.4 (L) 3.5 - 5.1 mmol/L   Chloride 109 101 - 111 mmol/L   CO2 22 22 - 32 mmol/L   Glucose, Bld 108 (H) 65 - 99 mg/dL   BUN 14 6 - 20 mg/dL   Creatinine, Ser 1.40 (H) 0.61 - 1.24 mg/dL   Calcium 10.1 8.9 - 10.3 mg/dL   Total Protein 7.8 6.5 - 8.1 g/dL   Albumin 5.1 (H) 3.5 - 5.0 g/dL   AST 20 15 - 41 U/L   ALT 21 17 - 63 U/L   Alkaline Phosphatase 77 38 - 126 U/L   Total Bilirubin 1.1 0.3 - 1.2 mg/dL   GFR calc non Af Amer >60 >60 mL/min   GFR calc Af Amer >60 >60 mL/min    Comment: (NOTE) The eGFR has been calculated using the CKD EPI equation. This calculation has not been validated in all clinical situations. eGFR's persistently <60 mL/min signify possible Chronic Kidney Disease.    Anion gap 12 5 - 15  CBC     Status: Abnormal   Collection Time: 07/15/15 11:46 PM  Result Value Ref Range   WBC 22.8 (H) 4.0 - 10.5 K/uL   RBC 5.16 4.22 - 5.81 MIL/uL   Hemoglobin 15.9 13.0 - 17.0 g/dL   HCT 44.4 39.0 - 52.0 %   MCV 86.0 78.0 - 100.0 fL   MCH 30.8 26.0 - 34.0 pg   MCHC 35.8 30.0 - 36.0 g/dL   RDW 13.4 11.5 - 15.5 %   Platelets 313 150 - 400 K/uL  Ethanol (ETOH)     Status: None   Collection Time: 07/15/15 11:47 PM  Result Value Ref Range   Alcohol, Ethyl (B) <5 <5  mg/dL    Comment:        LOWEST DETECTABLE LIMIT FOR SERUM ALCOHOL IS 5 mg/dL FOR MEDICAL PURPOSES ONLY   Salicylate level     Status: None   Collection Time: 07/15/15 11:47 PM  Result Value Ref Range   Salicylate Lvl <2.5 2.8 - 30.0 mg/dL  Acetaminophen level     Status: Abnormal   Collection Time: 07/15/15 11:47 PM  Result Value Ref Range   Acetaminophen  (Tylenol), Serum <10 (L) 10 - 30 ug/mL    Comment:        THERAPEUTIC CONCENTRATIONS VARY SIGNIFICANTLY. A RANGE OF 10-30 ug/mL MAY BE AN EFFECTIVE CONCENTRATION FOR MANY PATIENTS. HOWEVER, SOME ARE BEST TREATED AT CONCENTRATIONS OUTSIDE THIS RANGE. ACETAMINOPHEN CONCENTRATIONS >150 ug/mL AT 4 HOURS AFTER INGESTION AND >50 ug/mL AT 12 HOURS AFTER INGESTION ARE OFTEN ASSOCIATED WITH TOXIC REACTIONS.     Blood Alcohol level:  Lab Results  Component Value Date   ETH <5 07/15/2015    Physical Findings: AIMS: Facial and Oral Movements Muscles of Facial Expression: None, normal Lips and Perioral Area: None, normal Jaw: None, normal Tongue: None, normal,Extremity Movements Upper (arms, wrists, hands, fingers): None, normal Lower (legs, knees, ankles, toes): None, normal, Trunk Movements Neck, shoulders, hips: None, normal, Overall Severity Severity of abnormal movements (highest score from questions above): None, normal Incapacitation due to abnormal movements: None, normal Patient's awareness of abnormal movements (rate only patient's report): No Awareness, Dental Status Current problems with teeth and/or dentures?: No Does patient usually wear dentures?: No  CIWA:    COWS:     Musculoskeletal: Strength & Muscle Tone: within normal limits Gait & Station: normal Patient leans: normal  Psychiatric Specialty Exam: Review of Systems  Constitutional: Negative.   HENT: Negative.   Eyes: Negative.   Respiratory: Negative.   Cardiovascular: Negative.   Gastrointestinal: Negative.   Genitourinary: Negative.   Musculoskeletal: Positive for back pain.  Skin: Negative.   Neurological: Negative.   Endo/Heme/Allergies: Negative.   Psychiatric/Behavioral: Positive for depression and substance abuse. The patient is nervous/anxious.     Blood pressure 108/62, pulse 104, temperature 98.2 F (36.8 C), temperature source Oral, resp. rate 20, height 5' 6.75" (1.695 m), weight 69.4  kg (153 lb).Body mass index is 24.16 kg/(m^2).  General Appearance: Fairly Groomed  Engineer, water::  Fair  Speech:  Clear and Coherent  Volume:  Normal  Mood:  Anxious, Depressed and Dysphoric  Affect:  Restricted  Thought Process:  Coherent and Goal Directed  Orientation:  Full (Time, Place, and Person)  Thought Content:  symptoms events worries concerns  Suicidal Thoughts:  No  Homicidal Thoughts:  No  Memory:  Immediate;   Fair Recent;   Fair Remote;   Fair  Judgement:  Fair  Insight:  Present and Shallow  Psychomotor Activity:  Restlessness  Concentration:  Fair  Recall:  [employer] of Knowledge:Fair  Language: Fair  Akathisia:  No  Handed:  Right  AIMS (if indicated):     Assets:  Desire for Improvement  ADL's:  Intact  Cognition: WNL  Sleep:  Number of Hours: 6   Treatment Plan Summary: Daily contact with patient to assess and evaluate symptoms and progress in treatment and Medication management Supportive approach/coping skills Cocaine dependence; monitor mood fluctuations from coming off the cocaine Pain, anxiety-agitation; continue the Neurontin 800 mg Depression; continue the Wellbutrin SR 100 mg BID Muscle spasms; lioresal/diazepam Ruminative thinking at night; Seroquel 200 mg HS Work with CBT/mindfulness Harmonee Tozer  A, MD 07/17/2015, 2:03 PM

## 2015-07-17 NOTE — [facility] Group Notes (Signed)
[facility] LCSW Group Therapy 07/17/2015 1:15 PM Type of Therapy: Group Therapy Participation Level: Active  Participation Quality: Attentive, Sharing and Supportive  Affect: Blunted   Cognitive: Alert and Oriented  Insight: Developing/Improving and Engaged  Engagement in Therapy: Developing/Improving and Engaged  Modes of Intervention: Clarification, Confrontation, Discussion, Education, Exploration, Limit-setting, Orientation, Problem-solving, Rapport Building, Dance movement psychotherapisteality Testing, Socialization and Support  Summary of Progress/Problems: The topic for today was feelings about relapse. Pt discussed what relapse prevention is to them and identified triggers that they are on the path to relapse. Pt processed their feeling towards relapse and was able to relate to peers. Pt discussed coping skills that can be used for relapse prevention. Patient discussed the importance of being around positive supports in his recovery. He discussed feeling hurt by his family's lack of support at times. CSW and other members provided patient with emotional support and encouragement.    Samuella BruinKristin Eric Nees, MSW, LCSW Clinical Social Worker [facility] [phone]
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
## 2015-07-18 NOTE — [facility] Group Notes (Signed)
Adult Therapy Group Note - Clinical Social Work  Date:  07/18/2015 Time:  10:00-11:00AM  Group Topic/Focus:  Healthy Coping Skills  Additional Comments:  The main focus of today's process group was to discuss patient-specific behaviors that have prevented them from living the life they want.  The reasons underlying these behaviors were touched on lightly.  How to make a plan to promote learning how to use different behaviors was then explored fully.  Pt reported that he is accustomed to using people who are trying to help him by conning them or manipulating them.  He stated that this poor decision-making, using substances, and holding on to deep resentments are the behaviors that keep him from living the life he wants.  He was given valuable feedback from another patient who told him that he has inspired her.  He shared a great deal in group, was appropriate and on topic throughout.  Participation Level:  Active  Participation Quality:  Attentive and Sharing  Affect:  Blunted  Cognitive:  Appropriate  Insight: Good  Engagement in Group:  Engaged  Modes of Intervention:  Clarification and Discussion  Sarina SerGrossman-Orr, Chondra Boyde Jo 07/18/2015, 12:47 PM

## 2015-07-18 NOTE — Progress Notes (Signed)
Patient did attend the evening speaker AA meeting.  

## 2015-07-18 NOTE — Progress Notes (Signed)
D: Pt is flat and withdrawn to self. Pt endorses moderate depression; states, "I have done a lot of thing that I am not proud of; this is an opportunity for me to make my life better; I will be going the [facility] and I hope things will start to get better from now." Pt is also worried; states, "I just hope I will be able to do it this time around at list for my 31 y/o son." Pt remained calm and cooperative A: Medications offered as prescribed.  Support, encouragement, and safe environment provided.  15-minute safety checks continue. R: Pt was med compliant.  Pt attended the evening's wrap-up group meeting. Safety checks continue

## 2015-07-18 NOTE — Progress Notes (Signed)
D: Patient denies SI/HI and A/V hallucinations; patient reports sleep is good; reports appetite is fair ; reports energy level is low ; rates depression as 8/10; rates hopelessness 8/10; rates anxiety as 7/10;   A: Monitored q 15 minutes; patient encouraged to attend groups; patient educated about medications; patient given medications per physician orders; patient encouraged to express feelings and/or concerns  R: Patient is irritated but is trying to be cooperative and follow directions; most of the day he has been cooperative even during meal times; patient's interaction with staff and peers is minimal but he is assertive; patient was able to set goal to talk with staff 1:1 when having feelings of SI; patient is taking medications as prescribed and tolerating medications; patient was in the bed most of the day

## 2015-07-18 NOTE — Progress Notes (Signed)
Patient ID: Theodore Carr, male   DOB: 1985/08/01, 30 y.o.   MRN: [id number] [facility] MD Progress Note  07/18/2015 4:01 PM Theodore Carr  MRN:  [id number]  Subjective:  [name] states he wants to get his life back together. He continues to endorse high anxiety levels. He ruminates about all the negative things he has done. He admits he can't belief he has led cocaine ruin his life. He is enrolled in school he has plans to get himself straight for his sake and his kid. States he wants to keep himself focused. He asked for Valium to be increased. He says when he is not using, he has the tendency to manipulate people.   Principal Problem: Cocaine abuse with cocaine-induced mood disorder (HCC)  Diagnosis:   Patient Active Problem List   Diagnosis Date Noted  . Polysubstance dependence including opioid drug with daily use (HCC) [F19.20] 07/16/2015  . Cocaine abuse with cocaine-induced mood disorder [facility]) [F14.14] 07/16/2015   Total Time spent with patient: 15 minutes  Past Psychiatric History: See admission H and P  Past Medical History:  Past Medical History  Diagnosis Date  . Hypertension   . Compression fracture of T12 vertebra (HCC) 2008   History reviewed. No pertinent past surgical history.  Family History: History reviewed. No pertinent family history.  Family Psychiatric  History: See admission H and P  Social History:  History  Alcohol Use No     History  Drug Use No    Comment: not in 2 years    Social History   Social History  . Marital Status: Single    Spouse Name: N/A  . Number of Children: N/A  . Years of Education: N/A   Social History Main Topics  . Smoking status: Current Every Day Smoker -- 0.50 packs/day    Types: Cigarettes  . Smokeless tobacco: None  . Alcohol Use: No  . Drug Use: No     Comment: not in 2 years  . Sexual Activity: Yes   Other Topics Concern  . None   Social History Narrative   Additional Social History:   Sleep: Fair, 6.75 per  documentation  Appetite:  Fair  Current Medications: Current Facility-Administered Medications  Medication Dose Route Frequency Provider Last Rate Last Dose  . acetaminophen (TYLENOL) tablet 650 mg  650 mg Oral Q6H PRN Sanjuana Kava, NP      . alum & mag hydroxide-simeth (MAALOX/MYLANTA) 200-200-20 MG/5ML suspension 30 mL  30 mL Oral Q4H PRN Sanjuana Kava, NP      . baclofen (LIORESAL) tablet 10 mg  10 mg Oral TID Rachael Fee, MD   10 mg at 07/18/15 1212  . buPROPion (WELLBUTRIN SR) 12 hr tablet 100 mg  100 mg Oral BID Rachael Fee, MD   100 mg at 07/18/15 0759  . cloNIDine (CATAPRES) tablet 0.05 mg  0.05 mg Oral BID Rachael Fee, MD   0.05 mg at 07/18/15 0759  . diazepam (VALIUM) tablet 5 mg  5 mg Oral BID PRN Rachael Fee, MD   5 mg at 07/18/15 0801  . gabapentin (NEURONTIN) tablet 800 mg  800 mg Oral TID Sanjuana Kava, NP   800 mg at 07/18/15 1212  . ibuprofen (ADVIL,MOTRIN) tablet 800 mg  800 mg Oral Q6H PRN Rachael Fee, MD      . magnesium hydroxide (MILK OF MAGNESIA) suspension 30 mL  30 mL Oral Daily PRN Sanjuana Kava, NP      .  nicotine (NICODERM CQ - dosed in mg/24 hours) patch 21 mg  21 mg Transdermal Q0600 Sanjuana KavaAgnes I Nwoko, NP   21 mg at 07/18/15 0800  . QUEtiapine (SEROQUEL) tablet 200 mg  200 mg Oral QHS Rachael FeeIrving A Lugo, MD   200 mg at 07/17/15 2136   Lab Results:  No results found for this or any previous visit (from the past 48 hour(s)).  Blood Alcohol level:  Lab Results  Component Value Date   ETH <5 07/15/2015   Physical Findings: AIMS: Facial and Oral Movements Muscles of Facial Expression: None, normal Lips and Perioral Area: None, normal Jaw: None, normal Tongue: None, normal,Extremity Movements Upper (arms, wrists, hands, fingers): None, normal Lower (legs, knees, ankles, toes): None, normal, Trunk Movements Neck, shoulders, hips: None, normal, Overall Severity Severity of abnormal movements (highest score from questions above): None, normal Incapacitation  due to abnormal movements: None, normal Patient's awareness of abnormal movements (rate only patient's report): No Awareness, Dental Status Current problems with teeth and/or dentures?: No Does patient usually wear dentures?: No  CIWA:    COWS:     Musculoskeletal: Strength & Muscle Tone: within normal limits Gait & Station: normal Patient leans: normal  Psychiatric Specialty Exam: Review of Systems  Constitutional: Negative.   HENT: Negative.   Eyes: Negative.   Respiratory: Negative.   Cardiovascular: Negative.   Gastrointestinal: Negative.   Genitourinary: Negative.   Musculoskeletal: Positive for back pain.  Skin: Negative.   Neurological: Negative.   Endo/Heme/Allergies: Negative.   Psychiatric/Behavioral: Positive for depression and substance abuse. The patient is nervous/anxious.     Blood pressure 135/74, pulse 74, temperature 98.2 F (36.8 C), temperature source Oral, resp. rate 20, height 5' 6.75" (1.695 m), weight 69.4 kg (153 lb).Body mass index is 24.16 kg/(m^2).  General Appearance: Fairly Groomed  Patent attorneyye Contact::  Fair  Speech:  Clear and Coherent  Volume:  Normal  Mood:  Anxious, Depressed and Dysphoric  Affect:  Restricted  Thought Process:  Coherent and Goal Directed  Orientation:  Full (Time, Place, and Person)  Thought Content:  symptoms events worries concerns  Suicidal Thoughts:  No  Homicidal Thoughts:  No  Memory:  Immediate;   Fair Recent;   Fair Remote;   Fair  Judgement:  Fair  Insight:  Present and Shallow  Psychomotor Activity:  Restlessness  Concentration:  Fair  Recall:  [employer]Fair  Fund of Knowledge:Fair  Language: Fair  Akathisia:  No  Handed:  Right  AIMS (if indicated):     Assets:  Desire for Improvement  ADL's:  Intact  Cognition: WNL  Sleep:  Number of Hours: 6.75   Treatment Plan Summary: Daily contact with patient to assess and evaluate symptoms and progress in treatment and Medication management Supportive approach/coping  skills Cocaine dependence; monitor mood fluctuations from coming off the cocaine Pain, anxiety-agitation; continue the Neurontin 800 mg Depression; continue the Wellbutrin SR 100 mg BID Muscle spasms; lioresal/diazepam Ruminative thinking at night; Seroquel 200 mg HS Work with CBT/mindfulness.  Armandina StammerNwoko, Agnes I, NP, PMHNP-BC 07/18/2015, 4:01 PM

## 2015-07-19 MED ORDER — DIAZEPAM 5 MG PO TABS
5.0000 mg | ORAL_TABLET | Freq: Three times a day (TID) | ORAL | Status: DC | PRN
  Administered 2015-07-19 – 2015-07-22 (×9): 5 mg via ORAL
  Filled 2015-07-19 (×9): qty 1

## 2015-07-19 MED ORDER — CLONIDINE HCL 0.1 MG PO TABS
0.1000 mg | ORAL_TABLET | Freq: Two times a day (BID) | ORAL | Status: DC
  Administered 2015-07-19 – 2015-07-22 (×6): 0.1 mg via ORAL
  Filled 2015-07-19 (×9): qty 1

## 2015-07-19 MED ORDER — OXCARBAZEPINE 150 MG PO TABS
150.0000 mg | ORAL_TABLET | Freq: Two times a day (BID) | ORAL | Status: DC
  Administered 2015-07-19 – 2015-07-20 (×2): 150 mg via ORAL
  Filled 2015-07-19 (×4): qty 1

## 2015-07-19 MED ORDER — SUCRALFATE 1 G PO TABS
1.0000 g | ORAL_TABLET | Freq: Three times a day (TID) | ORAL | Status: DC
  Administered 2015-07-19 – 2015-07-21 (×4): 1 g via ORAL
  Filled 2015-07-19 (×17): qty 1

## 2015-07-19 MED ORDER — ZIPRASIDONE HCL 20 MG PO CAPS: 20.0000 mg | ORAL_CAPSULE | Freq: Two times a day (BID) | ORAL | Status: DC | PRN

## 2015-07-19 MED ORDER — ZIPRASIDONE MESYLATE 20 MG IM SOLR: 10.0000 mg | Freq: Two times a day (BID) | INTRAMUSCULAR | Status: DC | PRN

## 2015-07-19 NOTE — [facility] Group Notes (Signed)
[facility] Group Notes:  (Clinical Social Work)  07/19/2015  10:00-11:00AM  Summary of Progress/Problems:   The main focus of today's process group was to   1)  discuss the importance of adding supports  2)  define health supports versus unhealthy supports  3)  identify the patient's current unhealthy supports and plan how to handle them  4)  Identify the patient's current healthy supports and plan what to add.  An emphasis was placed on using counselor, doctor, therapy groups, 12-step groups, and problem-specific support groups to expand supports.    The patient expressed full comprehension of the concepts presented, and agreed that there is a need to add more supports.  The patient talked frequently and with a great deal of frustration about his support system.  Type of Therapy:  Process Group with Motivational Interviewing  Participation Level:  Active  Participation Quality:  Attentive and Sharing  Affect:  Depressed and Flat  Cognitive:  Appropriate  Insight:  Improving  Engagement in Therapy:  Engaged  Modes of Intervention:   Education, Support and Processing, Activity  Theodore MantleMareida Grossman-Orr, LCSW 07/19/2015

## 2015-07-19 NOTE — Progress Notes (Signed)
Patient ID: Theodore Carr, male   DOB: Sep 06, 1985, 30 y.o.   MRN: [id number] Patient ID: Theodore Carr, male   DOB: 1986/01/09, 30 y.o.   MRN: [id number] [facility] MD Progress Note  07/19/2015 4:49 PM Theodore Carr  MRN:  [id number]  Subjective:  Theodore Carr reports, "The day is not going well for me. My blood pressure is bad. Carr'm angry & frustrated at the bald black dude. Carr'm trying to end on a good note, but my mood is flaring up on me. Carr need something to help with my stomach. Carr hate to throw-up. No one is listening to me. Carr need milk & soda, real soda, not ginger-ale while on the floor to help my stomach".  Objective: Staff reports that Theodore Carr is acting out on the unit today. Reports indicated that he was reaching over the desk to grab the phone to make a phone call during groups disregarding the rules that no patient is authorized to use the phone while group sessions are ongoing. Theodore Carr is counseled, He is instructed that no aggression is allowed from patients to patients or the staff. He is instructed & encouraged to abide by the rules that is meant for every patient.  Principal Problem: Cocaine abuse with cocaine-induced mood disorder (HCC)  Diagnosis:   Patient Active Problem List   Diagnosis Date Noted  . Polysubstance dependence including opioid drug with daily use (HCC) [F19.20] 07/16/2015  . Cocaine abuse with cocaine-induced mood disorder [facility]) [F14.14] 07/16/2015   Total Time spent with patient: 15 minutes  Past Psychiatric History: See admission H and P  Past Medical History:  Past Medical History  Diagnosis Date  . Hypertension   . Compression fracture of T12 vertebra (HCC) 2008   History reviewed. No pertinent past surgical history.  Family History: History reviewed. No pertinent family history.  Family Psychiatric  History: See admission H and P  Social History:  History  Alcohol Use No     History  Drug Use No    Comment: not in 2 years    Social History   Social History   . Marital Status: Single    Spouse Name: N/A  . Number of Children: N/A  . Years of Education: N/A   Social History Main Topics  . Smoking status: Current Every Day Smoker -- 0.50 packs/day    Types: Cigarettes  . Smokeless tobacco: None  . Alcohol Use: No  . Drug Use: No     Comment: not in 2 years  . Sexual Activity: Yes   Other Topics Concern  . None   Social History Narrative   Additional Social History:   Sleep: Fair, 6.75 per documentation  Appetite:  Fair  Current Medications: Current Facility-Administered Medications  Medication Dose Route Frequency Provider Last Rate Last Dose  . acetaminophen (TYLENOL) tablet 650 mg  650 mg Oral Q6H PRN Sanjuana Kava, NP      . alum & mag hydroxide-simeth (MAALOX/MYLANTA) 200-200-20 MG/5ML suspension 30 mL  30 mL Oral Q4H PRN Sanjuana Kava, NP   30 mL at 07/19/15 1311  . baclofen (LIORESAL) tablet 10 mg  10 mg Oral TID Rachael Fee, MD   10 mg at 07/19/15 1146  . buPROPion (WELLBUTRIN SR) 12 hr tablet 100 mg  100 mg Oral BID Rachael Fee, MD   100 mg at 07/19/15 0951  . cloNIDine (CATAPRES) tablet 0.05 mg  0.05 mg Oral BID Rachael Fee, MD   0.05 mg at 07/19/15 0951  .  diazepam (VALIUM) tablet 5 mg  5 mg Oral TID PRN Sanjuana Kava, NP   5 mg at 07/19/15 1448  . gabapentin (NEURONTIN) tablet 800 mg  800 mg Oral TID Sanjuana Kava, NP   800 mg at 07/19/15 1147  . ibuprofen (ADVIL,MOTRIN) tablet 800 mg  800 mg Oral Q6H PRN Rachael Fee, MD   800 mg at 07/19/15 0954  . magnesium hydroxide (MILK OF MAGNESIA) suspension 30 mL  30 mL Oral Daily PRN Sanjuana Kava, NP      . nicotine (NICODERM CQ - dosed in mg/24 hours) patch 21 mg  21 mg Transdermal Q0600 Sanjuana Kava, NP   21 mg at 07/19/15 0952  . OXcarbazepine (TRILEPTAL) tablet 150 mg  150 mg Oral BID Sanjuana Kava, NP   150 mg at 07/19/15 1309  . QUEtiapine (SEROQUEL) tablet 200 mg  200 mg Oral QHS Rachael Fee, MD   200 mg at 07/18/15 2227  . sucralfate (CARAFATE) tablet 1 g  1  g Oral TID WC & HS Sanjuana Kava, NP      . ziprasidone (GEODON) capsule 20 mg  20 mg Oral BID PRN Sanjuana Kava, NP       Or  . ziprasidone (GEODON) injection 10 mg  10 mg Intramuscular BID PRN Sanjuana Kava, NP       Lab Results:  No results found for this or any previous visit (from the past 48 hour(s)).  Blood Alcohol level:  Lab Results  Component Value Date   ETH <5 07/15/2015   Physical Findings: AIMS: Facial and Oral Movements Muscles of Facial Expression: None, normal Lips and Perioral Area: None, normal Jaw: None, normal Tongue: None, normal,Extremity Movements Upper (arms, wrists, hands, fingers): None, normal Lower (legs, knees, ankles, toes): None, normal, Trunk Movements Neck, shoulders, hips: None, normal, Overall Severity Severity of abnormal movements (highest score from questions above): None, normal Incapacitation due to abnormal movements: None, normal Patient's awareness of abnormal movements (rate only patient's report): No Awareness, Dental Status Current problems with teeth and/or dentures?: No Does patient usually wear dentures?: No  CIWA:    COWS:     Musculoskeletal: Strength & Muscle Tone: within normal limits Gait & Station: normal Patient leans: normal  Psychiatric Specialty Exam: Review of Systems  Constitutional: Negative.   HENT: Negative.   Eyes: Negative.   Respiratory: Negative.   Cardiovascular: Negative.   Gastrointestinal: Negative.   Genitourinary: Negative.   Musculoskeletal: Positive for back pain.  Skin: Negative.   Neurological: Negative.   Endo/Heme/Allergies: Negative.   Psychiatric/Behavioral: Positive for depression and substance abuse. The patient is nervous/anxious.     Blood pressure 124/106, pulse 135, temperature 98.3 F (36.8 C), temperature source Oral, resp. rate 18, height 5' 6.75" (1.695 m), weight 69.4 kg (153 lb).Body mass index is 24.16 kg/(m^2).  General Appearance: Fairly Groomed  Patent attorney::  Fair   Speech:  Clear and Coherent  Volume:  Normal  Mood:  Anxious, Depressed and Dysphoric  Affect:  Restricted  Thought Process:  Coherent and Goal Directed  Orientation:  Full (Time, Place, and Person)  Thought Content:  symptoms events worries concerns  Suicidal Thoughts:  No  Homicidal Thoughts:  No  Memory:  Immediate;   Fair Recent;   Fair Remote;   Fair  Judgement:  Fair  Insight:  Present and Shallow  Psychomotor Activity:  Restlessness  Concentration:  Fair  Recall:  [employer] of Knowledge:Fair  Language: Fair  Akathisia:  No  Handed:  Right  AIMS (if indicated):     Assets:  Desire for Improvement  ADL's:  Intact  Cognition: WNL  Sleep:  Number of Hours: 5.5   Treatment Plan Summary: Daily contact with patient to assess and evaluate symptoms and progress in treatment and Medication management Supportive approach/coping skills Cocaine dependence; monitor mood fluctuations from coming off the cocaine Pain, anxiety-agitation; continue the Neurontin 800 mg Depression; continue the Wellbutrin SR 100 mg BID Muscle spasms; lioresal/increased the valium to 0.5 mg tid prn. Ruminative thinking at night; Seroquel 200 mg HS. Agitation or Psychosis: Geodon 20 mg po or 10 mg IM bid PRN. Elevated BP: increased the clonidine to 0.1 mg bid. Acid reflux. Start Carafate 1 gm Work with CBT/mindfulness.  Theodore Carr, Theodore Bas I, NP, PMHNP-BC 07/19/2015, 4:49 PM

## 2015-07-19 NOTE — Progress Notes (Signed)
D- Patient appears agitated this shift.  Patient was observed in the milieu interacting well with peers.  Patient's appearance is disheveled.  Patient attended this evenings group.  Patient denies SI/HI/AVH. No complaints. A- Scheduled medications administered to patient, per MD orders. Support and encouragement provided.   R- Patient contracts for safety at this time.  Patient remains safe at this time.

## 2015-07-19 NOTE — Progress Notes (Signed)
Patient did attend the evening speaker AA meeting.  

## 2015-07-19 NOTE — Progress Notes (Signed)
Patient ID: Jones BalesSteven Reyez, male   DOB: Oct 10, 1985, 30 y.o.   MRN: [id number] Adult Psychoeducational Group Note  Date:  07/19/2015 Time:  1:45pm   Group Topic/Focus:  Identifying Needs:   The focus of this group is to help patients identify their personal needs that have been historically problematic and identify healthy behaviors to address their needs.  Participation Level:  Did Not Attend  Participation Quality: n/a  Affect: n/a  Cognitive: n/a  Insight:n/a  Engagement in Group:  n/a  Modes of Intervention:  Activity, Discussion, Education and Support  Additional Comments:  Pt chose not to attend group.   Aurora Maskwyman, Mikey Maffett E 07/19/2015, 2:21 PM

## 2015-07-19 NOTE — Progress Notes (Signed)
D) Pt has had a hard day today. Became frustrated and upset over wanting to make a phone call at 2 pm today. Pt came up to the phone without a shirt, while group was in progress on the 300 and became irate with a staff member when the phone was removed from him. Pt angry and threatening a particular staff member. A) provided Pt with a 1:1 at this time. Pt was able to be deescalated verbally and was given 5 mg of Valium at this time to decrease his anxiety and help him to focus. R) Pt rated his anxiety at a 7, hopelessness at a 7 and his anxiety at a 10. Denies SI and HI.

## 2015-07-19 NOTE — Progress Notes (Signed)
D    Pt was demanding vanilla doctor pepper and said their was a doctors order saying he could have it     He was very argumentative and belligerent when staff tried to explain we only had ginger ale or drinks out of the drink machine    He finally accepted that the drink machine where he had gotten it before was not turned on  A   Verbal support given   Medications administered and effectiveness monitored   Q 15 min checks    Reassured pt of staff desire to help him and give him what he needs  R   Pt is safe and has calmed and remains in control

## 2015-07-20 MED ORDER — QUETIAPINE FUMARATE 300 MG PO TABS
300.0000 mg | ORAL_TABLET | Freq: Every day | ORAL | Status: DC
Start: 2015-07-20 — End: 2015-07-21
  Administered 2015-07-21: 300 mg via ORAL
  Filled 2015-07-20 (×2): qty 1

## 2015-07-20 MED ORDER — OXCARBAZEPINE 300 MG PO TABS
300.0000 mg | ORAL_TABLET | Freq: Two times a day (BID) | ORAL | Status: DC
  Administered 2015-07-20 – 2015-07-22 (×4): 300 mg via ORAL
  Filled 2015-07-20 (×7): qty 1

## 2015-07-20 NOTE — [facility] Group Notes (Signed)
[facility] LCSW Group Therapy Note  Date/Time: 07/20/15 1:30pm  Type of Therapy and Topic:  Group Therapy:  Holding on to Grudges  Participation Level:  Active  Description of Group:    In this group patients will be asked to explore and define a grudge.  Patients will be guided to discuss their thoughts, feelings, and behaviors as to why one holds on to grudges and reasons why people have grudges. Patients will process the impact grudges have on daily life and identify thoughts and feelings related to holding on to grudges. Facilitator will challenge patients to identify ways of letting go of grudges and the benefits once released.  Patients will be confronted to address why one struggles letting go of grudges. Lastly, patients will identify feelings and thoughts related to what life would look like without grudges.  This group will be process-oriented, with patients participating in exploration of their own experiences as well as giving and receiving support and challenge from other group members.  Therapeutic Goals: 1. Patient will identify specific grudges related to their personal life. 2. Patient will identify feelings, thoughts, and beliefs around grudges. 3. Patient will identify how one releases grudges appropriately. 4. Patient will identify situations where they could have let go of the grudge, but instead chose to hold on.  Summary of Patient Progress  Patient discussed holding a grudge not only against himself but also towards the members [facility]. He described feeling hurt by their unwillingness to bring him clothing since he has been in the hospital, stating that he would have done it for them. Group discussed the challenges they have encountered when giving/support has not been reciprocated by others in their life. Patient expressed feeling scared of change- stating "I don't know how to live life without drugs." CSW and other group members provided patient with  emotional support and encouragement.   Therapeutic Modalities:   Cognitive Behavioral Therapy Solution Focused Therapy Motivational Interviewing Brief Therapy

## 2015-07-20 NOTE — Progress Notes (Signed)
[facility] MD Progress Note  07/20/2015 4:05 PM Jones BalesSteven Haze  MRN:  [id number] Subjective:  Theodore SpareSteven is having a hard time. Admits he has mood swings anger spells states he is trying to be mindful and control himself but he has a hard time doing it. He states he wants to be stable on his medications. States that being on Valium is better than being out there going off hurting people Principal Problem: Cocaine abuse with cocaine-induced mood disorder (HCC) Diagnosis:   Patient Active Problem List   Diagnosis Date Noted  . Polysubstance dependence including opioid drug with daily use (HCC) [F19.20] 07/16/2015  . Cocaine abuse with cocaine-induced mood disorder [facility](HCC) [F14.14] 07/16/2015   Total Time spent with patient: 20 minutes  Past Psychiatric History: see admission H and P  Past Medical History:  Past Medical History  Diagnosis Date  . Hypertension   . Compression fracture of T12 vertebra (HCC) 2008   History reviewed. No pertinent past surgical history. Family History: History reviewed. No pertinent family history. Family Psychiatric  History: see admission H and P Social History:  History  Alcohol Use No     History  Drug Use No    Comment: not in 2 years    Social History   Social History  . Marital Status: Single    Spouse Name: N/A  . Number of Children: N/A  . Years of Education: N/A   Social History Main Topics  . Smoking status: Current Every Day Smoker -- 0.50 packs/day    Types: Cigarettes  . Smokeless tobacco: None  . Alcohol Use: No  . Drug Use: No     Comment: not in 2 years  . Sexual Activity: Yes   Other Topics Concern  . None   Social History Narrative   Additional Social History:                         Sleep: Fair  Appetite:  Fair  Current Medications: Current Facility-Administered Medications  Medication Dose Route Frequency Provider Last Rate Last Dose  . acetaminophen (TYLENOL) tablet 650 mg  650 mg Oral Q6H PRN Sanjuana KavaAgnes I Nwoko, NP       . alum & mag hydroxide-simeth (MAALOX/MYLANTA) 200-200-20 MG/5ML suspension 30 mL  30 mL Oral Q4H PRN Sanjuana KavaAgnes I Nwoko, NP   30 mL at 07/19/15 1311  . baclofen (LIORESAL) tablet 10 mg  10 mg Oral TID Rachael FeeIrving A Nile Prisk, MD   10 mg at 07/20/15 1148  . buPROPion (WELLBUTRIN SR) 12 hr tablet 100 mg  100 mg Oral BID Rachael FeeIrving A Ferdie Bakken, MD   100 mg at 07/20/15 0914  . cloNIDine (CATAPRES) tablet 0.1 mg  0.1 mg Oral BID Sanjuana KavaAgnes I Nwoko, NP   0.1 mg at 07/20/15 0913  . diazepam (VALIUM) tablet 5 mg  5 mg Oral TID PRN Sanjuana KavaAgnes I Nwoko, NP   5 mg at 07/20/15 1159  . gabapentin (NEURONTIN) tablet 800 mg  800 mg Oral TID Sanjuana KavaAgnes I Nwoko, NP   800 mg at 07/20/15 1150  . ibuprofen (ADVIL,MOTRIN) tablet 800 mg  800 mg Oral Q6H PRN Rachael FeeIrving A Sonia Bromell, MD   800 mg at 07/19/15 0954  . magnesium hydroxide (MILK OF MAGNESIA) suspension 30 mL  30 mL Oral Daily PRN Sanjuana KavaAgnes I Nwoko, NP      . nicotine (NICODERM CQ - dosed in mg/24 hours) patch 21 mg  21 mg Transdermal Q0600 Sanjuana KavaAgnes I Nwoko, NP   21  mg at 07/20/15 0900  . Oxcarbazepine (TRILEPTAL) tablet 300 mg  300 mg Oral BID Rachael Fee, MD      . QUEtiapine (SEROQUEL) tablet 300 mg  300 mg Oral QHS Rachael Fee, MD      . sucralfate (CARAFATE) tablet 1 g  1 g Oral TID WC & HS Sanjuana Kava, NP   1 g at 07/20/15 0912  . ziprasidone (GEODON) capsule 20 mg  20 mg Oral BID PRN Sanjuana Kava, NP       Or  . ziprasidone (GEODON) injection 10 mg  10 mg Intramuscular BID PRN Sanjuana Kava, NP        Lab Results: No results found for this or any previous visit (from the past 48 hour(s)).  Blood Alcohol level:  Lab Results  Component Value Date   ETH <5 07/15/2015    Physical Findings: AIMS: Facial and Oral Movements Muscles of Facial Expression: None, normal Lips and Perioral Area: None, normal Jaw: None, normal Tongue: None, normal,Extremity Movements Upper (arms, wrists, hands, fingers): None, normal Lower (legs, knees, ankles, toes): None, normal, Trunk Movements Neck, shoulders,  hips: None, normal, Overall Severity Severity of abnormal movements (highest score from questions above): None, normal Incapacitation due to abnormal movements: None, normal Patient's awareness of abnormal movements (rate only patient's report): No Awareness, Dental Status Current problems with teeth and/or dentures?: No Does patient usually wear dentures?: No  CIWA:  CIWA-Ar Total: 4 COWS:  COWS Total Score: 3  Musculoskeletal: Strength & Muscle Tone: within normal limits Gait & Station: normal Patient leans: normal  Psychiatric Specialty Exam: Review of Systems  Constitutional: Negative.   HENT: Negative.   Eyes: Negative.   Respiratory: Negative.   Cardiovascular: Negative.   Gastrointestinal: Negative.   Genitourinary: Negative.   Musculoskeletal: Negative.   Skin: Negative.   Neurological: Negative.   Endo/Heme/Allergies: Negative.   Psychiatric/Behavioral: Positive for substance abuse. The patient is nervous/anxious.     Blood pressure 128/72, pulse 94, temperature 98 F (36.7 C), temperature source Oral, resp. rate 16, height 5' 6.75" (1.695 m), weight 69.4 kg (153 lb).Body mass index is 24.16 kg/(m^2).  General Appearance: Fairly Groomed  Patent attorney::  Fair  Speech:  Clear and Coherent  Volume:  fluctuates  Mood:  Anxious, Depressed, Dysphoric and Irritable  Affect:  Labile  Thought Process:  Coherent and Goal Directed  Orientation:  Full (Time, Place, and Person)  Thought Content:  symptoms events worries concerns  Suicidal Thoughts:  No  Homicidal Thoughts:  No  Memory:  Immediate;   Fair Recent;   Fair Remote;   Fair  Judgement:  Fair  Insight:  Present and Shallow  Psychomotor Activity:  Restlessness  Concentration:  Fair  Recall:  [employer] of Knowledge:Fair  Language: Fair  Akathisia:  No  Handed:  Right  AIMS (if indicated):     Assets:  Desire for Improvement  ADL's:  Intact  Cognition: WNL  Sleep:  Number of Hours: 4.5   Treatment Plan  Summary: Daily contact with patient to assess and evaluate symptoms and progress in treatment and Medication management Supportive approach/coping skills Cocaine dependence; work on a relapse prevention plan Mood instability: Trileptal 150 increase to 300 BID Depression; Wellbutrin SR 100 BID Ruminative thinking; Seroquel 300 mg HS Muscle spasms/anxiety: continue the Valium 5 mg TID PRN Continue to work with CBT/mindfulness Nyko Gell A, MD 07/20/2015, 4:05 PM

## 2015-07-20 NOTE — Progress Notes (Signed)
Recreation Therapy Notes  Date: 04.03.2017 Time: 9:30am Location: [address] Dayroom   Group Topic: Stress Management  Goal Area(s) Addresses:  Patient will actively participate in stress management techniques presented during session.   Behavioral Response: Engaged, Appropriate   Intervention: Stress management techniques  Activity : Deep Breathing, Progressive Body Relaxation and Progressive Body Scan. LRT provided education, instruction and demonstration on practice of Deep Breathing, Progressive Body Relaxation and Progressive Body Scan. Patient was asked to participate in technique introduced during session.   Education:  Stress Management, Discharge Planning.   Education Outcome: Acknowledges education  Clinical Observations/Feedback: Patient actively engaged in technique introduced, expressed no concerns and demonstrated ability to practice independently post d/c.   Libero Puthoff L Channelle Bottger, LRT/CTRS        Lupe Handley L 07/20/2015 3:04 PM 

## 2015-07-20 NOTE — Progress Notes (Signed)
Pt spent the entire shift in bed asleep   He did not come to group or get medications   Pt is on Q 15 min checks and will continue to monitor and offered medication if needed

## 2015-07-20 NOTE — Progress Notes (Signed)
Pt attended spiritual care group on grief and loss facilitated by chaplain Kery Haltiwanger   Group opened with brief discussion and psycho-social ed around grief and loss in relationships and in relation to self - identifying life patterns, circumstances, changes that cause losses. Established group norm of speaking from own life experience. Group goal of establishing open and affirming space for members to share loss and experience with grief, normalize grief experience and provide psycho social education and grief support.     

## 2015-07-20 NOTE — Plan of Care (Signed)
Problem: Consults Goal: Depression Patient Education See Patient Education Module for education specifics.  Outcome: Not Progressing Nurse discussed depression/coping skills with patient.        

## 2015-07-20 NOTE — [facility] Group Notes (Signed)
[facility] LCSW Group Therapy 07/20/2015  1:15 PM   Type of Therapy: Group Therapy  Participation Level: Did Not Attend. Patient invited to participate but declined. CSW met with patient after group. He reports having difficulty with staff and continues to endorse high levels of depression and anger. He is uncertain if he should return to the [facility] as he is angry with house members over not bringing him his clothing. Patient states that he plans to contact his Sorrel to discuss.    Tilden Fossa, MSW, [place] Clinical Social Worker [facility] [phone]

## 2015-07-20 NOTE — Progress Notes (Signed)
D:  Patient has not filled out his self inventory sheet today.  Patient denied SI and HI, contracts for safety.  Denied A/V hallucinations. A:  Medications administered per MD orders.  Emotional support and encouragement given patient. R:  Safety maintained with 15 minute checks. Patient has been very irritable, angry, verbally abusive to staff this morning.  Patient had been talking to male patient who is now discharged.

## 2015-07-20 NOTE — Progress Notes (Signed)
At patient's request, CSW spoke with [facility] case [facility] Dorthula RueRicky Cruz [phone] ext [phone] regarding patient's progress in treatment and discharge plans. Per Clide Clifficky, patient will likely need to [facility] until she can locate another housing option. She plans to visit patient on Tuesday 4/4 at 7pm.   Belenda CruiseKristin Tareva Leske, LCSW Clinical Social Worker [facility] [phone]

## 2015-07-20 NOTE — Progress Notes (Signed)
Patient upset with nurse this morning who wanted to scan his identification tag while he was in bed.  Patient cursed nurse 3 times and stated he got his medications last night without his id tag.  Nurse asked patient if id came off in bed and patient stated he wanted his __ medicines and nurse was not going to keep his medicines from him.  Nurse informed charge nurse.  Patient continues to lay in bed.

## 2015-07-20 NOTE — [facility] Suicide Risk Assessment (Signed)
[facility] INPATIENT:  Family/Significant Other Suicide Prevention Education  Suicide Prevention Education:  Contact Attempts: bother Theodore Carr [phone], (name of family member/significant other) has been identified by the patient as the family member/significant other with whom the patient will be residing, and identified as the person(s) who will aid the patient in the event of a mental health crisis.  With written consent from the patient, two attempts were made to provide suicide prevention education, prior to and/or following the patient's discharge.  We were unsuccessful in providing suicide prevention education.  A suicide education pamphlet was given to the patient to share with family/significant other.  Date and time of first attempt: 07/20/15 at 4:15pm Date and time of second attempt: 07/21/15 at 10:30am  Theodore Carr, [place] CarboKristin L 07/20/2015, 4:15 PM

## 2015-07-21 LAB — GLUCOSE, CAPILLARY
Glucose-Capillary: 130 mg/dL — ABNORMAL HIGH (ref 65–99)
Glucose-Capillary: 144 mg/dL — ABNORMAL HIGH (ref 65–99)

## 2015-07-21 MED ORDER — QUETIAPINE FUMARATE 50 MG PO TABS
150.0000 mg | ORAL_TABLET | Freq: Every day | ORAL | Status: DC
  Administered 2015-07-21: 150 mg via ORAL
  Filled 2015-07-21 (×3): qty 3
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
## 2015-07-21 NOTE — Plan of Care (Signed)
Problem: Consults Goal: Suicide Risk Patient Education (See Patient Education module for education specifics)  Outcome: Not Progressing Nurse attempted to discuss suicide thoughts/depression/coping skills with patient.

## 2015-07-21 NOTE — [facility] Group Notes (Signed)
Patient did not attend group.

## 2015-07-21 NOTE — [facility] Group Notes (Signed)
The focus of this group is to educate the patient on the purpose and policies of crisis stabilization and provide a format to answer questions about their admission.  The group details unit policies and expectations of patients while admitted.  Patient did not attend 0900 nurse education orientation this morning.  Patient stayed in bed.  

## 2015-07-21 NOTE — Progress Notes (Signed)
  [facility] :  Will you be returning to the same living situation after discharge:  Yes,  patient plans to return to previous living situation At discharge, do you have transportation home?: Yes,  patient will be provided with bus pass Do you have the ability to pay for your medications: Yes,  patient will be provided with prescriptions at discharge  Release of information consent forms completed and in the chart;  Patient's signature needed at discharge.  Patient to Follow up at: Follow-up Information    [facility]   Specialty:  Professional Counselor   Why:  Medication management appointment with Theodore [facility]Mary Anne [facility] on Wednesday April 5th at 10:15am. Therapy [facility] on Thursday April 6th at 3pm. Call office if you need to reschedule.   Contact information:   [facility] [address] [place] [address] [phone]       Next level of care provider has access to [facility] Link:no  Safety Planning and Suicide Prevention discussed: Yes,  with patient   Have you used any form of tobacco in the last 30 days? (Cigarettes, Smokeless Tobacco, Cigars, and/or Pipes): Yes  Has patient been referred to the Quitline?: Patient refused referral  Patient has been referred for addiction treatment: Yes  Theodore Carr, Theodore Carr 07/21/2015, 10:41 AM
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
## 2015-07-21 NOTE — [facility] Group Notes (Signed)
[facility] LCSW Group Therapy 07/21/2015  1:15 PM   Type of Therapy: Group Therapy  Participation Level: Did Not Attend. Patient invited to participate but declined.   Samuella BruinKristin Eira Alpert, MSW, LCSW Clinical Social Worker [facility] [phone]

## 2015-07-21 NOTE — Progress Notes (Signed)
Was the fall witnessed: no  Patient condition before and after the fall: pt drowsy  Patient's reaction to the fall: nothing  Name of the doctor that was notified including date and time:spencer simon pa  0020   07/21/15  Any interventions and vital signs: High Risk Fall precautions   Medications  Vitals  138/88  95  97.8   18  cbg 144

## 2015-07-21 NOTE — Progress Notes (Signed)
D   Pt has been visible on the milieu talking on the phone to former male patients    So far he has been calm and cooperative and in control of his verbalizations and behavior  A    Verbal support given   Medications administered and effectiveness monitored   Q 15 min checks R   Pt safe at present

## 2015-07-21 NOTE — Progress Notes (Signed)
Pt did not attend wrap up group meeting.  

## 2015-07-21 NOTE — Progress Notes (Signed)
Recreation Therapy Notes  Animal-Assisted Activity (AAA) Program Checklist/Progress Notes Patient Eligibility Criteria Checklist & Daily Group note for Rec Tx Intervention  Date: 04.04.2017 Time: 2:45pm Location: [address]    AAA/T Program Assumption of Risk Form signed by Patient/ or Parent Legal Guardian Yes  Patient is free of allergies or sever asthma Yes  Patient reports no fear of animals Yes  Patient reports no history of cruelty to animals Yes  Patient understands his/her participation is voluntary Yes  Patient washes hands before animal contact Yes  Patient washes hands after animal contact Yes  Behavioral Response: Appropriate   Education: Hand Washing, Appropriate Animal Interaction   Education Outcome: Acknowledges education.   Clinical Observations/Feedback: Patient interacted appropriately with therapy dog and peers during session.    Marykay Lexenise L Eliya Bubar, LRT/CTRS        Theodore Carr L 07/21/2015 3:03 PM

## 2015-07-21 NOTE — Progress Notes (Signed)
Pt fell in the hallway unwitnessed  By staff   He seems a bit confused and is very drowsy and appears overmedicated   He did not receive his medications at bedtime as he was asleep in bed since the beginning of the shift    Vitals were taken and no injury was noted    Pt said he fell before he got out in the hall so it wouldn't show on the camera   He was seen crawling out in the hallway when staff found him   He is very obnoxious and demanding and kept requesting medication that was already given to him

## 2015-07-21 NOTE — Progress Notes (Addendum)
Patient has been very irritable, anxious, agitated today.  Has requested valium 5 mg this morning and was given second dose with Dr. Runell GessLugo's approval.  Patient stated he does have suicidal thoughts and homicidal thoughts toward [facility] people and [facility].  Charge nurse, Norwood LevoAC, MD informed.  Other staff members informed who work on adult unit.  Patient has been upset with nurse because he did not wake up early this morning, upset over his medications, requested valium medication to be increased.  Patient told nurse that she had better not hold his noon medications because he took his morning medications late.  Patient was talking to himself standing by nurse "I don't want to hear all this.  You need to bring all this to me in the morning.  This is the last time trying this."  Patient has been given emotional support and encouragement today by all staff.  Respirations even and unlabored.  No signs/symptoms of pain/distress noted on patient's face/body movements.  Safety maintained with 15 minute checks.
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
## 2015-07-21 NOTE — Progress Notes (Signed)
[facility] MD Progress Note  07/21/2015 7:14 PM Theodore Carr  MRN:  [id number] Subjective:  Theodore Carr has exhibited some agitation when he does not get what he wants. He continues to be focused on being able to get "his medications" when he is D/C. He has been verbally aggressive insulting to staff.  Principal Problem: Cocaine abuse with cocaine-induced mood disorder ([place]) Diagnosis:   Patient Active Problem List   Diagnosis Date Noted  . Polysubstance dependence including opioid drug with daily use ([place]) [F19.20] 07/16/2015  . Cocaine abuse with cocaine-induced mood disorder [facility]) [F14.14] 07/16/2015   Total Time spent with patient: 30 minutes  Past Psychiatric History: see admission H and P  Past Medical History:  Past Medical History  Diagnosis Date  . Hypertension   . Compression fracture of T12 vertebra ([place]) 2008   History reviewed. No pertinent past surgical history. Family History: History reviewed. No pertinent family history. Family Psychiatric  History: see admission H and P Social History:  History  Alcohol Use No     History  Drug Use No    Comment: not in 2 years    Social History   Social History  . Marital Status: Single    Spouse Name: N/A  . Number of Children: N/A  . Years of Education: N/A   Social History Main Topics  . Smoking status: Current Every Day Smoker -- 0.50 packs/day    Types: Cigarettes  . Smokeless tobacco: None  . Alcohol Use: No  . Drug Use: No     Comment: not in 2 years  . Sexual Activity: Yes   Other Topics Concern  . None   Social History Narrative   Additional Social History:                         Sleep: Fair  Appetite:  Fair  Current Medications: Current Facility-Administered Medications  Medication Dose Route Frequency Provider Last Rate Last Dose  . acetaminophen (TYLENOL) tablet 650 mg  650 mg Oral Q6H PRN Theodore Slates, NP      . alum & mag hydroxide-simeth (MAALOX/MYLANTA) 200-200-20 MG/5ML suspension 30  mL  30 mL Oral Q4H PRN Theodore Slates, NP   30 mL at 07/19/15 1311  . baclofen (LIORESAL) tablet 10 mg  10 mg Oral TID Theodore Bloom, MD   10 mg at 07/21/15 1724  . buPROPion (WELLBUTRIN SR) 12 hr tablet 100 mg  100 mg Oral BID Theodore Bloom, MD   100 mg at 07/21/15 1002  . cloNIDine (CATAPRES) tablet 0.1 mg  0.1 mg Oral BID Theodore Slates, NP   0.1 mg at 07/21/15 1724  . diazepam (VALIUM) tablet 5 mg  5 mg Oral TID PRN Theodore Slates, NP   5 mg at 07/21/15 1728  . gabapentin (NEURONTIN) tablet 800 mg  800 mg Oral TID Theodore Slates, NP   800 mg at 07/21/15 1724  . ibuprofen (ADVIL,MOTRIN) tablet 800 mg  800 mg Oral Q6H PRN Theodore Bloom, MD   800 mg at 07/21/15 0028  . magnesium hydroxide (MILK OF MAGNESIA) suspension 30 mL  30 mL Oral Daily PRN Theodore Slates, NP      . nicotine (NICODERM CQ - dosed in mg/24 hours) patch 21 mg  21 mg Transdermal Q0600 Theodore Slates, NP   21 mg at 07/21/15 1004  . Oxcarbazepine (TRILEPTAL) tablet 300 mg  300 mg Oral BID Theodore Chen  Sabra Heck, MD   300 mg at 07/21/15 1725  . QUEtiapine (SEROQUEL) tablet 150 mg  150 mg Oral QHS Theodore Bloom, MD      . sucralfate (CARAFATE) tablet 1 g  1 g Oral TID WC & HS Theodore Slates, NP   1 g at 07/21/15 0024  . ziprasidone (GEODON) capsule 20 mg  20 mg Oral BID PRN Theodore Slates, NP       Or  . ziprasidone (GEODON) injection 10 mg  10 mg Intramuscular BID PRN Theodore Slates, NP        Lab Results:  Results for orders placed or performed during the hospital encounter of 07/16/15 (from the past 48 hour(s))  Glucose, capillary     Status: Abnormal   Collection Time: 07/21/15 12:41 AM  Result Value Ref Range   Glucose-Capillary 144 (H) 65 - 99 mg/dL  Glucose, capillary     Status: Abnormal   Collection Time: 07/21/15 12:11 PM  Result Value Ref Range   Glucose-Capillary 130 (H) 65 - 99 mg/dL    Blood Alcohol level:  Lab Results  Component Value Date   ETH <5 07/15/2015    Physical Findings: AIMS: Facial and Oral  Movements Muscles of Facial Expression: None, normal Lips and Perioral Area: None, normal Jaw: None, normal Tongue: None, normal,Extremity Movements Upper (arms, wrists, hands, fingers): None, normal Lower (legs, knees, ankles, toes): None, normal, Trunk Movements Neck, shoulders, hips: None, normal, Overall Severity Severity of abnormal movements (highest score from questions above): None, normal Incapacitation due to abnormal movements: None, normal Patient's awareness of abnormal movements (rate only patient's report): No Awareness, Dental Status Current problems with teeth and/or dentures?: No Does patient usually wear dentures?: No  CIWA:  CIWA-Ar Total: 7 COWS:  COWS Total Score: 3  Musculoskeletal: Strength & Muscle Tone: within normal limits Gait & Station: normal Patient leans: normal  Psychiatric Specialty Exam: Review of Systems  Constitutional: Negative.   HENT: Negative.   Eyes: Negative.   Respiratory: Negative.   Gastrointestinal: Negative.   Genitourinary: Negative.   Musculoskeletal: Negative.   Skin: Negative.   Neurological: Negative.   Endo/Heme/Allergies: Negative.   Psychiatric/Behavioral: Positive for substance abuse. The patient is nervous/anxious.     Blood pressure 114/75, pulse 123, temperature 98.2 F (36.8 C), temperature source Oral, resp. rate 16, height 5' 6.75" (1.695 m), weight 69.4 kg (153 lb), SpO2 98 %.Body mass index is 24.16 kg/(m^2).  General Appearance: Fairly Groomed  Engineer, water::  Fair  Speech:  logical coherent  Volume:  fluctuates  Mood:  Dysphoric and Irritable  Affect:  Labile  Thought Process:  Coherent and Goal Directed  Orientation:  Full (Time, Place, and Person)  Thought Content:  symptoms events worries concerns  Suicidal Thoughts:  Suicidal threads that do not go with behavior when not observed  Homicidal Thoughts:  No  Memory:  Immediate;   Fair Recent;   Fair Remote;   Fair  Judgement:  Fair  Insight:   Shallow  Psychomotor Activity:  Restlessness  Concentration:  Fair  Recall:  [employer] of Knowledge:Fair  Language: Fair  Akathisia:  No  Handed:  Right  AIMS (if indicated):     Assets:  Desire for Improvement Social Support Vocational/Educational  ADL's:  Intact  Cognition: WNL  Sleep:  Number of Hours: 4.5   Treatment Plan Summary: Daily contact with patient to assess and evaluate symptoms and progress in treatment and Medication management Supportive approach/coping skills  Cocaine dependence; continue to work a relapse prevention plan Mood instability; will continue to work with the Trileptal at 300 mg BID Ruminative thinking/insomnia; will decrease the Seroquel to 150 mg HS, found the 300 mg too sedating Will work with CBT/mindfulness/anger management Note; met with case manager from [facility] as well as our case Freight forwarder and [place] to discuss follow up plan as he gets ready to be D/C in the AM. There is a plan in place that he understood and agreed to comply with.  Theodore Bloom, MD 07/21/2015, 7:14 PM

## 2015-07-22 MED ORDER — CLONIDINE HCL 0.1 MG PO TABS: 0.1000 mg | ORAL_TABLET | Freq: Two times a day (BID) | ORAL | Status: DC

## 2015-07-22 MED ORDER — QUETIAPINE FUMARATE 50 MG PO TABS: 150.0000 mg | ORAL_TABLET | Freq: Every day | ORAL | Status: DC

## 2015-07-22 MED ORDER — NICOTINE 21 MG/24HR TD PT24: 21.0000 mg | MEDICATED_PATCH | Freq: Every day | TRANSDERMAL | Status: DC

## 2015-07-22 MED ORDER — GABAPENTIN 800 MG PO TABS: 800.0000 mg | ORAL_TABLET | Freq: Three times a day (TID) | ORAL | Status: DC

## 2015-07-22 MED ORDER — SUCRALFATE 1 G PO TABS: 1.0000 g | ORAL_TABLET | Freq: Three times a day (TID) | ORAL | Status: DC

## 2015-07-22 MED ORDER — DIAZEPAM 5 MG PO TABS: 5.0000 mg | ORAL_TABLET | Freq: Three times a day (TID) | ORAL | Status: DC | PRN

## 2015-07-22 MED ORDER — BUPROPION HCL ER (SR) 100 MG PO TB12: 100.0000 mg | ORAL_TABLET | Freq: Two times a day (BID) | ORAL | Status: DC

## 2015-07-22 MED ORDER — BACLOFEN 10 MG PO TABS: 10.0000 mg | ORAL_TABLET | Freq: Three times a day (TID) | ORAL | Status: DC

## 2015-07-22 MED ORDER — OXCARBAZEPINE 300 MG PO TABS: 300.0000 mg | ORAL_TABLET | Freq: Two times a day (BID) | ORAL | Status: DC

## 2015-07-22 NOTE — BHH Suicide Risk Assessment (Signed)
[facility] Discharge Suicide Risk Assessment   Principal Problem: Cocaine abuse with cocaine-induced mood disorder [facility]) Discharge Diagnoses:  Patient Active Problem List   Diagnosis Date Noted  . Polysubstance dependence including opioid drug with daily use ([facility]) [F19.20] 07/16/2015  . Cocaine abuse with cocaine-induced mood disorder [facility]) [F14.14] 07/16/2015    Total Time spent with patient: 15 minutes  Musculoskeletal: Strength & Muscle Tone: within normal limits Gait & Station: normal Patient leans: normal  Psychiatric Specialty Exam: Review of Systems  Constitutional: Negative.   HENT: Negative.   Eyes: Negative.   Respiratory: Negative.   Cardiovascular: Negative.   Gastrointestinal: Negative.   Genitourinary: Negative.   Musculoskeletal: Positive for back pain.  Skin: Negative.   Neurological: Negative.   Endo/Heme/Allergies: Negative.   Psychiatric/Behavioral: Positive for substance abuse.    Blood pressure 143/109, pulse 104, temperature 98.2 F (36.8 C), temperature source Oral, resp. rate 17, height 5' 6.75" (1.695 m), weight 69.4 kg (153 lb), SpO2 98 %.Body mass index is 24.16 kg/(m^2).  General Appearance: Fairly Groomed  Patent attorney::  Fair  Speech:  Clear and Coherent409  Volume:  fluctuates  Mood:  Irritable  Affect:  Labile  Thought Process:  Coherent and Goal Directed  Orientation:  Full (Time, Place, and Person)  Thought Content:  plans as he moves on  Suicidal Thoughts:  On and off, no plans or intent at this moment  Homicidal Thoughts:  No  Memory:  Immediate;   Fair Recent;   Fair Remote;   Fair  Judgement:  Fair  Insight:  Present  Psychomotor Activity:  Normal  Concentration:  Fair  Recall:  [employer] of Knowledge:Fair  Language: Fair  Akathisia:  No  Handed:  Right  AIMS (if indicated):     Assets:  Desire for Improvement Housing Vocational/Educational Others:  resourceful  Sleep:  Number of Hours: 6  Cognition: WNL  ADL's:  Intact    Mental Status Per Nursing Assessment::   On Admission:  Suicidal ideation indicated by patient, Suicide plan, Self-harm thoughts, Self-harm behaviors  Demographic Factors:  Male and Caucasian  Loss Factors: Decline in physical health and Legal issues  Historical Factors: Impulsivity  Risk Reduction Factors:   Living with another person, especially a relative, Positive social support and Positive therapeutic relationship  Continued Clinical Symptoms:  Alcohol/Substance Abuse/Dependencies  Cognitive Features That Contribute To Risk:  Closed-mindedness and Polarized thinking    Suicide Risk:  Mild:  Suicidal ideation of limited frequency, intensity, duration, and specificity.  There are no identifiable plans, no associated intent, mild dysphoria and related symptoms, good self-control (both objective and subjective assessment), few other risk factors, and identifiable protective factors, including available and accessible social support.  Follow-up Information    Follow up with [facility].   Specialty:  Professional Counselor   Why:  Medication management appointment with [place] at the [facility] on Wednesday April 5th at 10:15am. Therapy appt with Randa Evens at [facility] on Thursday April 6th at 3pm. Call office if you need to reschedule.   Contact information:   [facility] [place] [address] [place] [address] [phone]       Plan Of Care/Follow-up recommendations:  Activity:  as tolerated Diet:  regular Theodore Carr is connected with services in the outside. He still has a bed at an [address] he can return too. An agency is paying his rent while he gets back on his feet. As  a requirement to stay at the house he has to go to meetings (AA/NA) [facility], he has a positive relationship with his provider. He is hoping that he will be kept on the Diazepam he has been taking while here. He  has been using 5 mg up to TID. He states the Diazepam helps when he has cravings to use cocaine. He has an appointment at [facility] today at 10:15. He is being given a prescription for #10 Diazepam 5 mg. If he was not able to stay on the Diazepam, he has enough medication to wean off during the next few days: taking 3-2-1. [facility] and [facility].  Rachael FeeLUGO,Lawsen Arnott A, MD 07/22/2015, 9:10 AM
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
## 2015-07-22 NOTE — Progress Notes (Signed)
D: Patient discharged home per MD order.  Patient received all personal belongings from unit and room.  Reviewed AVS/discharge instructions with patient. Patient received samples and prescriptions.  Patient denies SI/HI/AVH.  Reviewed prescriptions and medication with patient and he indicated understanding.  [facility]  Patient has appointment at 1000.  Patient left ambulatory for his appointment.

## 2015-07-30 DIAGNOSIS — Z139 Encounter for screening, unspecified: Secondary | ICD-10-CM

## 2015-08-06 DIAGNOSIS — Z139 Encounter for screening, unspecified: Secondary | ICD-10-CM

## 2015-08-06 NOTE — Congregational Nurse Program (Signed)
Congregational Nurse Program Note  Date of Encounter: 07/30/2015  Past Medical History: Past Medical History  Diagnosis Date  . Hypertension   . Compression fracture of T12 vertebra ([facility]) 2008    Encounter Details:     CNP Questionnaire - 07/30/15 1544    Patient Demographics   Is this a new or existing patient? Existing   Patient is considered a/an Not Applicable  homeless   Race African-American/Black   Patient Assistance   Location of Patient Assistance Not Applicable   Patient's financial/insurance status Low Income;Self-Pay   Uninsured Patient Yes   Patient referred to apply for the following financial assistance [employer] or to a Navigator  seeking food stamps   Food insecurities addressed Provided food supplies   Transportation assistance No   Assistance securing medications No  sees Theodore BanterMaryann [name], Theodore Carr   Educational health offerings Chronic disease;Spiritual care;Navigating the healthcare system;Exercise/physical activity;Safety;Medications   Encounter Details   Primary purpose of visit Acute Illness/Condition Visit;Chronic Illness/Condition Visit   Was an Emergency Department visit averted? Not Applicable   Does patient have a medical provider? Yes  Theodore BanterMaryann [name], Theodore Carr   Patient referred to Not Applicable  [facility]   Was a mental health screening completed? (GAINS tool) No   Does patient have dental issues? No   Does patient have vision issues? No   Since previous encounter, have you referred patient for abnormal blood pressure that resulted in a new diagnosis or medication change? No   Since previous encounter, have you referred patient for abnormal blood glucose that resulted in a new diagnosis or medication change? No   For Abstraction Use Only   Does patient have insurance? No     States he left [facility] [facility] - "did not work out".  Is currently seeing Theodore Carr for his medications.  Plans to apply for  SSI.  Referred to [facility] for assistance

## 2015-08-06 NOTE — Congregational Nurse Program (Signed)
Congregational Nurse Program Note  Date of Encounter: 08/06/2015  Past Medical History: Past Medical History  Diagnosis Date  . Hypertension   . Compression fracture of T12 vertebra (HCC) 2008    Encounter Details:     CNP Questionnaire - 08/06/15 1546    Patient Demographics   Is this a new or existing patient? Existing   Patient is considered a/an Not Applicable  homeless   Race African-American/Black   Patient Assistance   Location of Patient Assistance Not Applicable   Patient's financial/insurance status Low Income;Self-Pay   Uninsured Patient Yes   Patient referred to apply for the following financial assistance [employer] or to a Navigator  seeking food stamps   Food insecurities addressed Provided food supplies   Transportation assistance No   Assistance securing medications No  sees Pennie BanterMaryann [name], FNP   Educational health offerings Chronic disease;Spiritual care;Navigating the healthcare system;Exercise/physical activity;Safety;Medications   Encounter Details   Primary purpose of visit Acute Illness/Condition Visit;Chronic Illness/Condition Visit   Was an Emergency Department visit averted? Not Applicable   Does patient have a medical provider? Yes  Pennie BanterMaryann [name], FNP   Patient referred to Not Applicable  [facility]   Was a mental health screening completed? (GAINS tool) No   Does patient have dental issues? No   Does patient have vision issues? No   Since previous encounter, have you referred patient for abnormal blood pressure that resulted in a new diagnosis or medication change? No   Since previous encounter, have you referred patient for abnormal blood glucose that resulted in a new diagnosis or medication change? No   For Abstraction Use Only   Does patient have insurance? No       B/P check

## 2016-06-13 ENCOUNTER — Emergency Department (HOSPITAL_COMMUNITY)
Admission: EM | Admit: 2016-06-13 | Discharge: 2016-06-15 | Disposition: A | Discharge status: Home / Self Care | Payer: No Typology Code available for payment source | Attending: Emergency Medicine | Admitting: Emergency Medicine

## 2016-06-13 ENCOUNTER — Encounter (HOSPITAL_COMMUNITY): Payer: Self-pay | Admitting: Emergency Medicine

## 2016-06-13 DIAGNOSIS — F1120 Opioid dependence, uncomplicated: Secondary | ICD-10-CM | POA: Diagnosis present

## 2016-06-13 DIAGNOSIS — F6381 Intermittent explosive disorder: Secondary | ICD-10-CM | POA: Diagnosis present

## 2016-06-13 DIAGNOSIS — F329 Major depressive disorder, single episode, unspecified: Secondary | ICD-10-CM | POA: Insufficient documentation

## 2016-06-13 DIAGNOSIS — R45851 Suicidal ideations: Secondary | ICD-10-CM

## 2016-06-13 DIAGNOSIS — F1414 Cocaine abuse with cocaine-induced mood disorder: Secondary | ICD-10-CM | POA: Diagnosis present

## 2016-06-13 DIAGNOSIS — F1420 Cocaine dependence, uncomplicated: Secondary | ICD-10-CM

## 2016-06-13 DIAGNOSIS — Z79899 Other long term (current) drug therapy: Secondary | ICD-10-CM | POA: Insufficient documentation

## 2016-06-13 DIAGNOSIS — I10 Essential (primary) hypertension: Secondary | ICD-10-CM | POA: Insufficient documentation

## 2016-06-13 DIAGNOSIS — F1920 Other psychoactive substance dependence, uncomplicated: Secondary | ICD-10-CM | POA: Diagnosis present

## 2016-06-13 DIAGNOSIS — F17210 Nicotine dependence, cigarettes, uncomplicated: Secondary | ICD-10-CM | POA: Insufficient documentation

## 2016-06-13 LAB — COMPREHENSIVE METABOLIC PANEL
ALK PHOS: 66 U/L (ref 38–126)
ALT: 42 U/L (ref 17–63)
ANION GAP: 7 (ref 5–15)
AST: 37 U/L (ref 15–41)
Albumin: 4 g/dL (ref 3.5–5.0)
BILIRUBIN TOTAL: 0.2 mg/dL — AB (ref 0.3–1.2)
BUN: 15 mg/dL (ref 6–20)
CALCIUM: 9.2 mg/dL (ref 8.9–10.3)
CO2: 23 mmol/L (ref 22–32)
Chloride: 111 mmol/L (ref 101–111)
Creatinine, Ser: 1.14 mg/dL (ref 0.61–1.24)
Glucose, Bld: 119 mg/dL — ABNORMAL HIGH (ref 65–99)
Potassium: 3.7 mmol/L (ref 3.5–5.1)
Sodium: 141 mmol/L (ref 135–145)
Total Protein: 6.3 g/dL — ABNORMAL LOW (ref 6.5–8.1)

## 2016-06-13 LAB — SALICYLATE LEVEL

## 2016-06-13 LAB — RAPID URINE DRUG SCREEN, HOSP PERFORMED
Amphetamines: NOT DETECTED
BENZODIAZEPINES: NOT DETECTED
Barbiturates: NOT DETECTED
COCAINE: POSITIVE — AB
OPIATES: NOT DETECTED
Tetrahydrocannabinol: NOT DETECTED

## 2016-06-13 LAB — CBC
HCT: 37.7 % — ABNORMAL LOW (ref 39.0–52.0)
Hemoglobin: 13 g/dL (ref 13.0–17.0)
MCH: 30.6 pg (ref 26.0–34.0)
MCHC: 34.5 g/dL (ref 30.0–36.0)
MCV: 88.7 fL (ref 78.0–100.0)
PLATELETS: 273 10*3/uL (ref 150–400)
RBC: 4.25 MIL/uL (ref 4.22–5.81)
RDW: 14.4 % (ref 11.5–15.5)
WBC: 8.2 10*3/uL (ref 4.0–10.5)

## 2016-06-13 LAB — RAPID STREP SCREEN (MED CTR MEBANE ONLY): Streptococcus, Group A Screen (Direct): NEGATIVE

## 2016-06-13 LAB — ACETAMINOPHEN LEVEL

## 2016-06-13 LAB — ETHANOL: Alcohol, Ethyl (B): <5 mg/dL (ref <5)

## 2016-06-13 MED ORDER — CLONIDINE HCL 0.1 MG PO TABS
0.1000 mg | ORAL_TABLET | Freq: Two times a day (BID) | ORAL | Status: DC
  Administered 2016-06-13 – 2016-06-15 (×5): 0.1 mg via ORAL
  Filled 2016-06-13 (×5): qty 1

## 2016-06-13 MED ORDER — MAGIC MOUTHWASH
5.0000 mL | Freq: Once | ORAL | Status: AC
  Administered 2016-06-14: 5 mL via ORAL
  Filled 2016-06-13: qty 5

## 2016-06-13 MED ORDER — IBUPROFEN 200 MG PO TABS
600.0000 mg | ORAL_TABLET | Freq: Three times a day (TID) | ORAL | Status: DC | PRN
  Administered 2016-06-13 – 2016-06-15 (×3): 600 mg via ORAL
  Filled 2016-06-13 (×3): qty 3

## 2016-06-13 MED ORDER — OXCARBAZEPINE 300 MG PO TABS
300.0000 mg | ORAL_TABLET | Freq: Two times a day (BID) | ORAL | Status: DC
  Administered 2016-06-13 – 2016-06-14 (×3): 300 mg via ORAL
  Filled 2016-06-13 (×3): qty 1

## 2016-06-13 MED ORDER — GABAPENTIN 400 MG PO CAPS
800.0000 mg | ORAL_CAPSULE | Freq: Three times a day (TID) | ORAL | Status: DC
  Administered 2016-06-13: 800 mg via ORAL
  Filled 2016-06-13: qty 2

## 2016-06-13 MED ORDER — QUETIAPINE FUMARATE 50 MG PO TABS
150.0000 mg | ORAL_TABLET | Freq: Every day | ORAL | Status: DC
  Administered 2016-06-13: 150 mg via ORAL
  Filled 2016-06-13: qty 1

## 2016-06-13 MED ORDER — GABAPENTIN 300 MG PO CAPS
300.0000 mg | ORAL_CAPSULE | Freq: Three times a day (TID) | ORAL | Status: DC
  Administered 2016-06-13 – 2016-06-14 (×2): 300 mg via ORAL
  Filled 2016-06-13 (×2): qty 1

## 2016-06-13 MED ORDER — NICOTINE 21 MG/24HR TD PT24
21.0000 mg | MEDICATED_PATCH | Freq: Every day | TRANSDERMAL | Status: DC
  Administered 2016-06-13 – 2016-06-15 (×3): 21 mg via TRANSDERMAL
  Filled 2016-06-13 (×3): qty 1

## 2016-06-13 MED ORDER — LORAZEPAM 1 MG PO TABS
1.0000 mg | ORAL_TABLET | Freq: Once | ORAL | Status: AC
  Administered 2016-06-13: 1 mg via ORAL
  Filled 2016-06-13: qty 1

## 2016-06-13 MED ORDER — ONDANSETRON HCL 4 MG PO TABS: 4.0000 mg | ORAL_TABLET | Freq: Three times a day (TID) | ORAL | Status: DC | PRN

## 2016-06-13 MED ORDER — TRAZODONE HCL 100 MG PO TABS
100.0000 mg | ORAL_TABLET | Freq: Every day | ORAL | Status: DC
  Administered 2016-06-13: 100 mg via ORAL
  Filled 2016-06-13: qty 1

## 2016-06-13 MED ORDER — BUPROPION HCL ER (SR) 100 MG PO TB12
100.0000 mg | ORAL_TABLET | Freq: Two times a day (BID) | ORAL | Status: DC
  Administered 2016-06-13 – 2016-06-14 (×2): 100 mg via ORAL
  Filled 2016-06-13 (×2): qty 1

## 2016-06-13 MED ORDER — SUCRALFATE 1 G PO TABS
1.0000 g | ORAL_TABLET | Freq: Three times a day (TID) | ORAL | Status: DC
  Administered 2016-06-13 – 2016-06-15 (×7): 1 g via ORAL
  Filled 2016-06-13 (×9): qty 1

## 2016-06-13 MED ORDER — BACLOFEN 10 MG PO TABS
10.0000 mg | ORAL_TABLET | Freq: Three times a day (TID) | ORAL | Status: DC
  Administered 2016-06-13 – 2016-06-14 (×3): 10 mg via ORAL
  Filled 2016-06-13 (×3): qty 1

## 2016-06-13 MED ORDER — ZOLPIDEM TARTRATE 5 MG PO TABS: 5.0000 mg | ORAL_TABLET | Freq: Every evening | ORAL | Status: DC | PRN
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
## 2016-06-13 NOTE — ED Notes (Signed)
Patient irritable and uncooperative at this time. Plan of care discussed. Encouragement and support provided and safety maintain. Q 15 min safety check in place and video monitoring.
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
## 2016-06-13 NOTE — BH Assessment (Signed)
[facility] Assessment Progress Note   Case was staffed with Lord DNP who recommended an inpatient admission as appropriate bed placement is investigated.     

## 2016-06-13 NOTE — ED Provider Notes (Signed)
[facility]-EMERGENCY DEPT Provider Note   CSN: [id number] Arrival date & time: 06/13/16  1312     History   Chief Complaint Chief Complaint  Patient presents with  . Homicidal  . Suicidal    HPI Theodore Carr is a 31 y.o. male.  31 year old male presents with suicidal ideations with plan to overdose. Patient also has homicidal ideations towards other individuals. History of suicide attempt before in the past which involves trying to hang himself from inside the treatment room in this facility. Denies any ingestions at this time. Just released from prison and his medications were adjusted and he says that his symptoms became worse. Notes he has been abusing some of his medications and not taking others. Did recently use cocaine and alcohol but denies any daily use.  Patient also had a secondary complaint of 2 month history of sore throat without dysphagia or dyspnea.      Past Medical History:  Diagnosis Date  . Compression fracture of T12 vertebra (HCC) 2008  . Hypertension     Patient Active Problem List   Diagnosis Date Noted  . Polysubstance dependence including opioid drug with daily use (HCC) 07/16/2015  . Cocaine abuse with cocaine-induced mood disorder (HCC) 07/16/2015    History reviewed. No pertinent surgical history.     Home Medications    Prior to Admission medications   Medication Sig Start Date End Date Taking? Authorizing Provider  baclofen (LIORESAL) 10 MG tablet Take 1 tablet (10 mg total) by mouth 3 (three) times daily. 07/22/15   Adonis BrookSheila Agustin, NP  buPROPion [facility](WELLBUTRIN SR) 100 MG 12 hr tablet Take 1 tablet (100 mg total) by mouth 2 (two) times daily. 07/22/15   Adonis BrookSheila Agustin, NP  cloNIDine (CATAPRES) 0.1 MG tablet Take 1 tablet (0.1 mg total) by mouth 2 (two) times daily. 07/22/15   Adonis BrookSheila Agustin, NP  diazepam (VALIUM) 5 MG tablet Take 1 tablet (5 mg total) by mouth 3 (three) times daily as needed for anxiety (agitation). 07/22/15   Adonis BrookSheila Agustin, NP    gabapentin (NEURONTIN) 800 MG tablet Take 1 tablet (800 mg total) by mouth 3 (three) times daily. 07/22/15   Adonis BrookSheila Agustin, NP  nicotine (NICODERM CQ - DOSED IN MG/24 HOURS) 21 mg/24hr patch Place 1 patch (21 mg total) onto the skin daily at 6 (six) AM. 07/22/15   Adonis BrookSheila Agustin, NP  Oxcarbazepine (TRILEPTAL) 300 MG tablet Take 1 tablet (300 mg total) by mouth 2 (two) times daily. 07/22/15   Adonis BrookSheila Agustin, NP  QUEtiapine (SEROQUEL) 50 MG tablet Take 3 tablets (150 mg total) by mouth at bedtime. 07/22/15   Adonis BrookSheila Agustin, NP  sucralfate (CARAFATE) 1 g tablet Take 1 tablet (1 g total) by mouth 4 (four) times daily -  with meals and at bedtime. 07/22/15   Adonis BrookSheila Agustin, NP    Family History History reviewed. No pertinent family history.  Social History Social History  Substance Use Topics  . Smoking status: Current Every Day Smoker    Packs/day: 0.50    Types: Cigarettes  . Smokeless tobacco: Not on file  . Alcohol use No     Allergies   Amoxicillin   Review of Systems Review of Systems  All other systems reviewed and are negative.    Physical Exam Updated Vital Signs BP 130/98 (BP Location: Left Arm)   Pulse 98   Temp 97.9 F (36.6 C) (Oral)   Resp 18   SpO2 100%   Physical Exam  Constitutional: He is oriented  to person, place, and time. He appears well-developed and well-nourished.  Non-toxic appearance. No distress.  HENT:  Head: Normocephalic and atraumatic.  No visible lesions noted.  Eyes: Conjunctivae, EOM and lids are normal. Pupils are equal, round, and reactive to light.  Neck: Normal range of motion. Neck supple. No tracheal deviation present. No thyroid mass present.  Cardiovascular: Normal rate, regular rhythm and normal heart sounds.  Exam reveals no gallop.   No murmur heard. Pulmonary/Chest: Effort normal and breath sounds normal. No stridor. No respiratory distress. He has no decreased breath sounds. He has no wheezes. He has no rhonchi. He has no rales.   Abdominal: Soft. Normal appearance and bowel sounds are normal. He exhibits no distension. There is no tenderness. There is no rebound and no CVA tenderness.  Musculoskeletal: Normal range of motion. He exhibits no edema or tenderness.  Neurological: He is alert and oriented to person, place, and time. He has normal strength. No cranial nerve deficit or sensory deficit. GCS eye subscore is 4. GCS verbal subscore is 5. GCS motor subscore is 6.  Skin: Skin is warm and dry. No abrasion and no rash noted.  Psychiatric: His speech is normal. He is withdrawn. He is not actively hallucinating. Thought content is not paranoid and not delusional. He exhibits a depressed mood. He expresses suicidal ideation. He expresses suicidal plans. He is attentive.  Nursing note and vitals reviewed.    ED Treatments / Results  Labs (all labs ordered are listed, but only abnormal results are displayed) Labs Reviewed  COMPREHENSIVE METABOLIC PANEL - Abnormal; Notable for the following:       Result Value   Glucose, Bld 119 (*)    Total Protein 6.3 (*)    Total Bilirubin 0.2 (*)    All other components within normal limits  ACETAMINOPHEN LEVEL - Abnormal; Notable for the following:    Acetaminophen (Tylenol), Serum <10 (*)    All other components within normal limits  CBC - Abnormal; Notable for the following:    HCT 37.7 (*)    All other components within normal limits  RAPID URINE DRUG SCREEN, HOSP PERFORMED - Abnormal; Notable for the following:    Cocaine POSITIVE (*)    All other components within normal limits  ETHANOL  SALICYLATE LEVEL    EKG  EKG Interpretation None       Radiology No results found.  Procedures Procedures (including critical care time)  Medications Ordered in ED Medications  ondansetron (ZOFRAN) tablet 4 mg (not administered)  nicotine (NICODERM CQ - dosed in mg/24 hours) patch 21 mg (not administered)  zolpidem (AMBIEN) tablet 5 mg (not administered)  ibuprofen  (ADVIL,MOTRIN) tablet 600 mg (not administered)  baclofen (LIORESAL) tablet 10 mg (not administered)  buPROPion (WELLBUTRIN SR) 12 hr tablet 100 mg (not administered)  cloNIDine (CATAPRES) tablet 0.1 mg (not administered)  Oxcarbazepine (TRILEPTAL) tablet 300 mg (not administered)  gabapentin (NEURONTIN) capsule 800 mg (not administered)  QUEtiapine (SEROQUEL) tablet 150 mg (not administered)  sucralfate (CARAFATE) tablet 1 g (not administered)  LORazepam (ATIVAN) tablet 1 mg (not administered)     Initial Impression / Assessment and Plan / ED Course  I have reviewed the triage vital signs and the nursing notes.  Pertinent labs & imaging results that were available during my care of the patient were reviewed by me and considered in my medical decision making (see chart for details).     Patient is medically cleared for disposition by the psychiatry service  Final Clinical Impressions(s) / ED Diagnoses   Final diagnoses:  None    New Prescriptions New Prescriptions   No medications on file     Lorre Nick, MD 06/13/16 1530
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
## 2016-06-13 NOTE — ED Notes (Signed)
Pt c/o sore throat, EDP notified.

## 2016-06-14 DIAGNOSIS — Z79899 Other long term (current) drug therapy: Secondary | ICD-10-CM | POA: Diagnosis not present

## 2016-06-14 DIAGNOSIS — F6381 Intermittent explosive disorder: Secondary | ICD-10-CM | POA: Diagnosis present

## 2016-06-14 DIAGNOSIS — R45850 Homicidal ideations: Secondary | ICD-10-CM

## 2016-06-14 DIAGNOSIS — F17210 Nicotine dependence, cigarettes, uncomplicated: Secondary | ICD-10-CM | POA: Diagnosis not present

## 2016-06-14 DIAGNOSIS — F1420 Cocaine dependence, uncomplicated: Secondary | ICD-10-CM | POA: Diagnosis not present

## 2016-06-14 DIAGNOSIS — R45851 Suicidal ideations: Secondary | ICD-10-CM

## 2016-06-14 HISTORY — DX: Cocaine dependence, uncomplicated: F14.20

## 2016-06-14 MED ORDER — OXCARBAZEPINE 300 MG PO TABS
600.0000 mg | ORAL_TABLET | Freq: Two times a day (BID) | ORAL | Status: DC
  Administered 2016-06-14 – 2016-06-15 (×2): 600 mg via ORAL
  Filled 2016-06-14 (×2): qty 2

## 2016-06-14 MED ORDER — ASENAPINE MALEATE 5 MG SL SUBL
10.0000 mg | SUBLINGUAL_TABLET | Freq: Two times a day (BID) | SUBLINGUAL | Status: DC
  Administered 2016-06-14 – 2016-06-15 (×2): 10 mg via SUBLINGUAL
  Filled 2016-06-14 (×2): qty 2

## 2016-06-14 MED ORDER — ASENAPINE MALEATE 5 MG SL SUBL
10.0000 mg | SUBLINGUAL_TABLET | Freq: Two times a day (BID) | SUBLINGUAL | Status: DC
  Administered 2016-06-14: 10 mg via SUBLINGUAL
  Filled 2016-06-14: qty 2

## 2016-06-14 MED ORDER — HYDROXYZINE HCL 25 MG PO TABS
25.0000 mg | ORAL_TABLET | Freq: Three times a day (TID) | ORAL | Status: DC | PRN
  Administered 2016-06-15: 25 mg via ORAL
  Filled 2016-06-14: qty 1

## 2016-06-14 MED ORDER — BACLOFEN 10 MG PO TABS: 10.0000 mg | ORAL_TABLET | Freq: Two times a day (BID) | ORAL | Status: DC

## 2016-06-14 NOTE — BH Assessment (Signed)
[facility] Assessment Progress Note  Per Thedore MinsMojeed Akintayo, MD, this voluntary pt requires psychiatric hospitalization at this time.  The following facilities have been contacted to seek placement for this pt, with results as noted:  Beds available, information sent, decision pending:  [facility]   Declined:  [facility] (due to pt acuity)   At capacity[facility]  [facility]    Doylene Canninghomas Shaguana Love, [place] Triage Specialist [phone]

## 2016-06-14 NOTE — ED Notes (Signed)
Pt contracted for safety to have a blanket in his room. He slept most of the day. He woke up for dinner and felt irritated that his medication regimen has been changed, but did not become hostile, aggressive, and did not harass staff about it.

## 2016-06-14 NOTE — ED Notes (Signed)
Pharmacy called and reported that Sahpris and Trazodone increase risk for QRS abnormality. Nanine MeansJamison Lord, DNP discontinued the Trazadone.

## 2016-06-14 NOTE — ED Notes (Signed)
Pt asked RN to give him her pants so that he can hang himself. He is irritable, demanding, blaming and unreasonable. His room is modified for increased safety with no linen because he has used linen in the past to try and hang himself. He is being monitored closely by the camera and by 15-minute checks. He is very irritable that he is not getting dosages of his daily medications that he wants, and he is especially angry that he is not allowed to have Ativan for anxiety. He walks around without a shirt in his room and wears a torn shirt when he is in the common area.
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
## 2016-06-14 NOTE — Progress Notes (Signed)
06/14/16 1357:  LRT went to pt room to offer activities, pt was sleep.  Theodore Carr, LRT/CTRS

## 2016-06-14 NOTE — ED Notes (Signed)
Patient contracts for safety at this time. However patient does admit to intermittent SI with a plan to hang himself. Plan of care discussed with patient. Encouragement and support provided and safety maintain. Q 15 min safety checks remain in place and video monitoring.
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
## 2016-06-15 NOTE — Progress Notes (Signed)
06/15/16 1359:  LRT introduced self to pt and offered activities, pt declined.  Caroll RancherMarjette Basheer Molchan, LRT/CTRS
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
## 2016-06-15 NOTE — Discharge Instructions (Signed)
For your ongoing behavioral health needs you are advised to continue treatment with [facility]: ° °     [facility] °     [address] °     [place], [place] [address] °     [phone] °

## 2016-06-15 NOTE — ED Notes (Signed)
Patient A&Ox4, denies SI/HI and AVH, verbalized understanding of discharge instructions and follow up, no further questions at this time. Patient given information regarding discharge medications: Saphris 10mg  PO Bid, Clonidine 0.1mg  PO Bid, Hydroxyzine 25mg  PO Tid prn, Trileptal 600mg  PO Bid, Sucralfate 1g PO ACHS. Medications sent to [facility] at [address] [place]. All belongings returned to patient. Patient has been discharged.  Brayam Boeke, Wyman SongsterAngela Marie, RN
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
## 2016-06-15 NOTE — ED Notes (Signed)
Patient is A&Ox4, denies SI, HI, and AVH. Patient states he isn't suicidal anymore so he wants to go back to the shelter now. Patient has been calm and cooperative. Nanine MeansJamison Lord notified. Orders to discharge.

## 2016-06-15 NOTE — ED Notes (Signed)
Patient is A&Ox4, denies SI, HI, and AVH. Patient stated he would like to contract so he can get his blankets, pillowcase, etc back. Patient also spoke about needing his gabepentin and/or baclofen reordered for his back. Patient is calm and cooperative with an appropriate affect. Patient interacts appropriately with staff and is staying in his room. Patient taking medications as scheduled.  Azayla Polo, Wyman SongsterAngela Marie, RN

## 2016-06-16 LAB — CULTURE, GROUP A STREP (THRC)

## 2016-08-18 ENCOUNTER — Emergency Department (HOSPITAL_COMMUNITY)
Admission: EM | Admit: 2016-08-18 | Discharge: 2016-08-20 | Disposition: A | Discharge status: Home / Self Care | Payer: Federal, State, Local not specified - Other | Attending: Emergency Medicine | Admitting: Emergency Medicine

## 2016-08-18 ENCOUNTER — Encounter (HOSPITAL_COMMUNITY): Payer: Self-pay | Admitting: Emergency Medicine

## 2016-08-18 DIAGNOSIS — F1490 Cocaine use, unspecified, uncomplicated: Secondary | ICD-10-CM

## 2016-08-18 DIAGNOSIS — Z720 Tobacco use: Secondary | ICD-10-CM

## 2016-08-18 DIAGNOSIS — F1429 Cocaine dependence with unspecified cocaine-induced disorder: Secondary | ICD-10-CM | POA: Insufficient documentation

## 2016-08-18 DIAGNOSIS — Z79899 Other long term (current) drug therapy: Secondary | ICD-10-CM | POA: Insufficient documentation

## 2016-08-18 DIAGNOSIS — F1010 Alcohol abuse, uncomplicated: Secondary | ICD-10-CM

## 2016-08-18 DIAGNOSIS — F1414 Cocaine abuse with cocaine-induced mood disorder: Secondary | ICD-10-CM | POA: Diagnosis present

## 2016-08-18 DIAGNOSIS — J309 Allergic rhinitis, unspecified: Secondary | ICD-10-CM

## 2016-08-18 DIAGNOSIS — F1420 Cocaine dependence, uncomplicated: Secondary | ICD-10-CM | POA: Diagnosis present

## 2016-08-18 DIAGNOSIS — I10 Essential (primary) hypertension: Secondary | ICD-10-CM | POA: Insufficient documentation

## 2016-08-18 DIAGNOSIS — F17210 Nicotine dependence, cigarettes, uncomplicated: Secondary | ICD-10-CM | POA: Insufficient documentation

## 2016-08-18 DIAGNOSIS — R45851 Suicidal ideations: Secondary | ICD-10-CM

## 2016-08-18 DIAGNOSIS — F6381 Intermittent explosive disorder: Secondary | ICD-10-CM | POA: Diagnosis present

## 2016-08-18 LAB — COMPREHENSIVE METABOLIC PANEL
ALBUMIN: 4.5 g/dL (ref 3.5–5.0)
ALK PHOS: 49 U/L (ref 38–126)
ALT: 19 U/L (ref 17–63)
ANION GAP: 10 (ref 5–15)
AST: 27 U/L (ref 15–41)
BILIRUBIN TOTAL: 1 mg/dL (ref 0.3–1.2)
BUN: 14 mg/dL (ref 6–20)
CALCIUM: 9.6 mg/dL (ref 8.9–10.3)
CO2: 24 mmol/L (ref 22–32)
CREATININE: 1.19 mg/dL (ref 0.61–1.24)
Chloride: 107 mmol/L (ref 101–111)
GFR calc Af Amer: >60 mL/min (ref >60)
GFR calc non Af Amer: >60 mL/min (ref >60)
GLUCOSE: 120 mg/dL — AB (ref 65–99)
Potassium: 3.6 mmol/L (ref 3.5–5.1)
SODIUM: 141 mmol/L (ref 135–145)
TOTAL PROTEIN: 6.9 g/dL (ref 6.5–8.1)

## 2016-08-18 LAB — RAPID URINE DRUG SCREEN, HOSP PERFORMED
Amphetamines: NOT DETECTED
Barbiturates: NOT DETECTED
Benzodiazepines: NOT DETECTED
COCAINE: POSITIVE — AB
OPIATES: NOT DETECTED
Tetrahydrocannabinol: NOT DETECTED

## 2016-08-18 LAB — CBC WITH DIFFERENTIAL/PLATELET
BASOS PCT: 0 %
Basophils Absolute: 0 10*3/uL (ref 0.0–0.1)
EOS ABS: 0.7 10*3/uL (ref 0.0–0.7)
Eosinophils Relative: 7 %
HEMATOCRIT: 44.7 % (ref 39.0–52.0)
HEMOGLOBIN: 15.5 g/dL (ref 13.0–17.0)
LYMPHS ABS: 2.6 10*3/uL (ref 0.7–4.0)
Lymphocytes Relative: 25 %
MCH: 31.3 pg (ref 26.0–34.0)
MCHC: 34.7 g/dL (ref 30.0–36.0)
MCV: 90.3 fL (ref 78.0–100.0)
MONOS PCT: 10 %
Monocytes Absolute: 1 10*3/uL (ref 0.1–1.0)
NEUTROS ABS: 6.2 10*3/uL (ref 1.7–7.7)
NEUTROS PCT: 58 %
Platelets: 313 10*3/uL (ref 150–400)
RBC: 4.95 MIL/uL (ref 4.22–5.81)
RDW: 13.5 % (ref 11.5–15.5)
WBC: 10.5 10*3/uL (ref 4.0–10.5)

## 2016-08-18 LAB — ACETAMINOPHEN LEVEL

## 2016-08-18 LAB — SALICYLATE LEVEL

## 2016-08-18 LAB — ETHANOL

## 2016-08-18 MED ORDER — ZOLPIDEM TARTRATE 5 MG PO TABS: 5.0000 mg | ORAL_TABLET | Freq: Every evening | ORAL | Status: DC | PRN

## 2016-08-18 MED ORDER — IBUPROFEN 200 MG PO TABS
600.0000 mg | ORAL_TABLET | Freq: Three times a day (TID) | ORAL | Status: DC | PRN
  Administered 2016-08-18 – 2016-08-20 (×2): 600 mg via ORAL
  Filled 2016-08-18 (×2): qty 3

## 2016-08-18 MED ORDER — CARBAMAZEPINE 200 MG PO TABS
200.0000 mg | ORAL_TABLET | Freq: Three times a day (TID) | ORAL | Status: DC
  Administered 2016-08-18 – 2016-08-19 (×3): 200 mg via ORAL
  Filled 2016-08-18 (×3): qty 1

## 2016-08-18 MED ORDER — VITAMIN B-1 100 MG PO TABS
100.0000 mg | ORAL_TABLET | Freq: Every day | ORAL | Status: DC
  Administered 2016-08-18: 100 mg via ORAL
  Filled 2016-08-18: qty 1

## 2016-08-18 MED ORDER — ALUM & MAG HYDROXIDE-SIMETH 200-200-20 MG/5ML PO SUSP: 30.0000 mL | ORAL | Status: DC | PRN

## 2016-08-18 MED ORDER — BUPROPION HCL ER (SR) 100 MG PO TB12
100.0000 mg | ORAL_TABLET | Freq: Two times a day (BID) | ORAL | Status: DC
  Administered 2016-08-18 – 2016-08-19 (×2): 100 mg via ORAL
  Filled 2016-08-18 (×2): qty 1

## 2016-08-18 MED ORDER — NICOTINE 21 MG/24HR TD PT24
21.0000 mg | MEDICATED_PATCH | Freq: Every day | TRANSDERMAL | Status: DC
  Administered 2016-08-18 – 2016-08-19 (×2): 21 mg via TRANSDERMAL
  Filled 2016-08-18 (×2): qty 1

## 2016-08-18 MED ORDER — LORATADINE 10 MG PO TABS
10.0000 mg | ORAL_TABLET | Freq: Every day | ORAL | Status: DC
  Administered 2016-08-18 – 2016-08-20 (×3): 10 mg via ORAL
  Filled 2016-08-18 (×3): qty 1

## 2016-08-18 MED ORDER — CLONIDINE HCL 0.1 MG PO TABS
0.1000 mg | ORAL_TABLET | Freq: Two times a day (BID) | ORAL | Status: DC
  Administered 2016-08-18 – 2016-08-20 (×5): 0.1 mg via ORAL
  Filled 2016-08-18 (×5): qty 1

## 2016-08-18 MED ORDER — ACETAMINOPHEN 325 MG PO TABS: 650.0000 mg | ORAL_TABLET | ORAL | Status: DC | PRN

## 2016-08-18 MED ORDER — GABAPENTIN 400 MG PO CAPS
800.0000 mg | ORAL_CAPSULE | Freq: Three times a day (TID) | ORAL | Status: DC
  Administered 2016-08-18 – 2016-08-19 (×3): 800 mg via ORAL
  Filled 2016-08-18 (×3): qty 2

## 2016-08-18 MED ORDER — THIAMINE HCL 100 MG/ML IJ SOLN: 100.0000 mg | Freq: Every day | INTRAMUSCULAR | Status: DC

## 2016-08-18 MED ORDER — LORAZEPAM 1 MG PO TABS: 0.0000 mg | ORAL_TABLET | Freq: Two times a day (BID) | ORAL | Status: DC

## 2016-08-18 MED ORDER — CYCLOBENZAPRINE HCL 10 MG PO TABS
10.0000 mg | ORAL_TABLET | Freq: Three times a day (TID) | ORAL | Status: DC | PRN
Start: 2016-08-18 — End: 2016-08-19
  Administered 2016-08-18: 10 mg via ORAL
  Filled 2016-08-18: qty 1

## 2016-08-18 MED ORDER — QUETIAPINE FUMARATE 50 MG PO TABS
150.0000 mg | ORAL_TABLET | Freq: Every day | ORAL | Status: DC
  Administered 2016-08-18: 150 mg via ORAL
  Filled 2016-08-18: qty 1

## 2016-08-18 MED ORDER — FLUTICASONE PROPIONATE 50 MCG/ACT NA SUSP
2.0000 | Freq: Every day | NASAL | Status: DC
  Administered 2016-08-18: 2 via NASAL
  Filled 2016-08-18: qty 16

## 2016-08-18 MED ORDER — LORAZEPAM 1 MG PO TABS
0.0000 mg | ORAL_TABLET | Freq: Four times a day (QID) | ORAL | Status: DC
  Administered 2016-08-18: 2 mg via ORAL
  Filled 2016-08-18: qty 2

## 2016-08-18 MED ORDER — BUSPIRONE HCL 10 MG PO TABS
20.0000 mg | ORAL_TABLET | Freq: Three times a day (TID) | ORAL | Status: DC
  Administered 2016-08-18 – 2016-08-19 (×3): 20 mg via ORAL
  Filled 2016-08-18 (×3): qty 2

## 2016-08-18 MED ORDER — MIRTAZAPINE 30 MG PO TABS
15.0000 mg | ORAL_TABLET | Freq: Every day | ORAL | Status: DC
  Administered 2016-08-18: 15 mg via ORAL
  Filled 2016-08-18: qty 1

## 2016-08-18 MED ORDER — ONDANSETRON HCL 4 MG PO TABS: 4.0000 mg | ORAL_TABLET | Freq: Three times a day (TID) | ORAL | Status: DC | PRN

## 2016-08-18 MED ORDER — LORAZEPAM 1 MG PO TABS: 1.0000 mg | ORAL_TABLET | Freq: Three times a day (TID) | ORAL | Status: DC | PRN

## 2016-08-18 NOTE — ED Provider Notes (Signed)
[facility]-EMERGENCY DEPT Provider Note   CSN: [id number] Arrival date & time: 08/18/16  1239     History   Chief Complaint Chief Complaint  Patient presents with  . Suicidal, ETOH withdrawal    HPI Param Capri is a 31 y.o. male with a PMHx of HTN, intermittent explosive disorder, cocaine abuse, and polysubstance abuse, who presents to the ED via [employer] voluntarily with complaints of SI with a plan to jump in front of a car. History of prior attempts with overdosing and hanging himself. He has been noncompliant with his medications for the last 2 weeks. He reports that he supposed to be on Wellbutrin 100 mg BID, Seroquel 150mg  daily, buspar 20mg  TID, clonidine 0.1mg  BID, neurontin 800mg  TID, remeron 15mg  QHS, tegretol 200mg  TID, and flexeril 10mg  q8h PRN. He is no longer taking effexor, ativan, or amitriptyline. He admits to consuming alcohol last night, cannot quantify how much and states that he "drank too much". He also admits to cocaine use yesterday, both smoked and snorted, and states that he "didn't do very much" and cannot quantify how much money was spent because he did not buy it. He admits to being a cigarette smoker. He also reports 3 days of clear rhinorrhea and sinus congestion related to seasonal allergies. He denies HI, AVH, fevers, chills, ear pain or drainage, sore throat, cough, CP, SOB, abd pain, N/V/D/C, hematuria, dysuria, myalgias, arthralgias, numbness, tingling, focal weakness, or any other complaints at this time. He is here voluntarily asking for help with his depression, SI, and alcohol/cocaine use.    The history is provided by the patient and medical records. No language interpreter was used.  Mental Health Problem  Presenting symptoms: depression and suicidal thoughts   Presenting symptoms: no hallucinations and no homicidal ideas   Patient accompanied by:  Law enforcement Onset quality:  Gradual Duration:  2 days Timing:  Constant Progression:  Worsening Chronicity:   Recurrent Context: alcohol use, drug abuse and noncompliance   Treatment compliance:  Untreated Time since last psychoactive medication taken:  2 weeks Relieved by:  None tried Worsened by:  Alcohol and drugs Ineffective treatments:  None tried Associated symptoms: no abdominal pain and no chest pain   Risk factors: hx of mental illness and hx of suicide attempts     Past Medical History:  Diagnosis Date  . Compression fracture of T12 vertebra (HCC) 2008  . Hypertension     Patient Active Problem List   Diagnosis Date Noted  . Intermittent explosive disorder 06/14/2016  . Cocaine use disorder, severe, dependence (HCC) 06/14/2016  . Polysubstance dependence including opioid drug with daily use (HCC) 07/16/2015  . Cocaine abuse with cocaine-induced mood disorder (HCC) 07/16/2015    History reviewed. No pertinent surgical history.     Home Medications    Prior to Admission medications   Medication Sig Start Date End Date Taking? Authorizing Provider  amitriptyline (ELAVIL) 100 MG tablet Take 100 mg by mouth at bedtime.    Historical Provider, MD  baclofen (LIORESAL) 10 MG tablet Take 1 tablet (10 mg total) by mouth 3 (three) times daily. Patient not taking: Reported on 06/13/2016 07/22/15   Adonis Brook, NP  buPROPion [facility] SR) 100 MG 12 hr tablet Take 1 tablet (100 mg total) by mouth 2 (two) times daily. Patient not taking: Reported on 06/13/2016 07/22/15   Adonis Brook, NP  busPIRone (BUSPAR) 10 MG tablet Take 20 mg by mouth 3 (three) times daily.    Historical Provider, MD  carbamazepine (TEGRETOL) 200 MG tablet Take 200 mg by mouth 3 (three) times daily.    Historical Provider, MD  cloNIDine (CATAPRES) 0.1 MG tablet Take 1 tablet (0.1 mg total) by mouth 2 (two) times daily. 07/22/15   Adonis Brook, NP  cyclobenzaprine (FLEXERIL) 10 MG tablet Take 10 mg by mouth 3 (three) times daily as needed for muscle spasms.    Historical Provider, MD  diazepam (VALIUM) 5 MG tablet  Take 1 tablet (5 mg total) by mouth 3 (three) times daily as needed for anxiety (agitation). Patient not taking: Reported on 06/13/2016 07/22/15   Adonis Brook, NP  gabapentin (NEURONTIN) 800 MG tablet Take 1 tablet (800 mg total) by mouth 3 (three) times daily. Patient taking differently: Take 800 mg by mouth 4 (four) times daily.  07/22/15   Adonis Brook, NP  ibuprofen (ADVIL,MOTRIN) 800 MG tablet Take 800 mg by mouth every 8 (eight) hours as needed for moderate pain (back pain).    Historical Provider, MD  LORazepam (ATIVAN) 1 MG tablet Take 1 mg by mouth every 8 (eight) hours as needed for anxiety.    Historical Provider, MD  mirtazapine (REMERON) 15 MG tablet Take 15 mg by mouth at bedtime.    Historical Provider, MD  nicotine (NICODERM CQ - DOSED IN MG/24 HOURS) 21 mg/24hr patch Place 1 patch (21 mg total) onto the skin daily at 6 (six) AM. Patient not taking: Reported on 06/13/2016 07/22/15   Adonis Brook, NP  Oxcarbazepine (TRILEPTAL) 300 MG tablet Take 1 tablet (300 mg total) by mouth 2 (two) times daily. Patient not taking: Reported on 06/13/2016 07/22/15   Adonis Brook, NP  QUEtiapine (SEROQUEL) 50 MG tablet Take 3 tablets (150 mg total) by mouth at bedtime. 07/22/15   Adonis Brook, NP  sucralfate (CARAFATE) 1 g tablet Take 1 tablet (1 g total) by mouth 4 (four) times daily -  with meals and at bedtime. Patient not taking: Reported on 06/13/2016 07/22/15   Adonis Brook, NP  venlafaxine [facility]) 100 MG tablet Take 100 mg by mouth daily.    Historical Provider, MD  Vitamins A & D (VITAMIN A & D) ointment Apply 1 application topically as needed for dry skin or lip care.    Historical Provider, MD    Family History No family history on file.  Social History Social History  Substance Use Topics  . Smoking status: Current Every Day Smoker    Packs/day: 0.50    Types: Cigarettes  . Smokeless tobacco: Not on file  . Alcohol use No     Allergies   Amoxicillin and Haloperidol and  related   Review of Systems Review of Systems  Constitutional: Negative for chills and fever.  HENT: Positive for congestion and rhinorrhea. Negative for ear discharge, ear pain and sore throat.   Respiratory: Negative for cough and shortness of breath.   Cardiovascular: Negative for chest pain.  Gastrointestinal: Negative for abdominal pain, constipation, diarrhea, nausea and vomiting.  Genitourinary: Negative for dysuria and hematuria.  Musculoskeletal: Negative for arthralgias and myalgias.  Skin: Negative for color change.  Allergic/Immunologic: Positive for environmental allergies. Negative for immunocompromised state.  Neurological: Negative for weakness and numbness.  Psychiatric/Behavioral: Positive for suicidal ideas. Negative for confusion, hallucinations and homicidal ideas.   All other systems reviewed and are negative for acute change except as noted in the HPI.    Physical Exam Updated Vital Signs BP (!) 138/94 (BP Location: Left Arm)   Pulse (!) 103   Temp  97.9 F (36.6 C) (Oral)   Resp 20   Ht 5\' 7"  (1.702 m)   Wt 70.8 kg   SpO2 93%   BMI 24.43 kg/m   Physical Exam  Constitutional: He is oriented to person, place, and time. Vital signs are normal. He appears well-developed and well-nourished.  Non-toxic appearance. No distress.  Afebrile, nontoxic, NAD  HENT:  Head: Normocephalic and atraumatic.  Nose: Mucosal edema and rhinorrhea present.  Mouth/Throat: Oropharynx is clear and moist and mucous membranes are normal.  Mild nasal congestion and clear rhinorrhea  Eyes: Conjunctivae and EOM are normal. Right eye exhibits no discharge. Left eye exhibits no discharge.  Neck: Normal range of motion. Neck supple.  Cardiovascular: Normal rate, regular rhythm, normal heart sounds and intact distal pulses.  Exam reveals no gallop and no friction rub.   No murmur heard. Pulmonary/Chest: Effort normal and breath sounds normal. No respiratory distress. He has no  decreased breath sounds. He has no wheezes. He has no rhonchi. He has no rales.  Abdominal: Soft. Normal appearance and bowel sounds are normal. He exhibits no distension. There is no tenderness. There is no rigidity, no rebound, no guarding, no CVA tenderness, no tenderness at McBurney's point and negative Murphy's sign.  Musculoskeletal: Normal range of motion.  Neurological: He is alert and oriented to person, place, and time. He has normal strength. No sensory deficit.  Skin: Skin is warm, dry and intact. No rash noted.  Psychiatric: His affect is blunt. He is not actively hallucinating. He exhibits a depressed mood. He expresses suicidal ideation. He expresses no homicidal ideation. He expresses suicidal plans. He expresses no homicidal plans.  Flat and depressed affect. Endorsing SI with a plan, denies HI or AVH, doesn't seem to be responding to internal stimuli.   Nursing note and vitals reviewed.    ED Treatments / Results  Labs (all labs ordered are listed, but only abnormal results are displayed) Labs Reviewed  COMPREHENSIVE METABOLIC PANEL - Abnormal; Notable for the following:       Result Value   Glucose, Bld 120 (*)    All other components within normal limits  ACETAMINOPHEN LEVEL - Abnormal; Notable for the following:    Acetaminophen (Tylenol), Serum <10 (*)    All other components within normal limits  RAPID URINE DRUG SCREEN, HOSP PERFORMED - Abnormal; Notable for the following:    Cocaine POSITIVE (*)    All other components within normal limits  CBC WITH DIFFERENTIAL/PLATELET  ETHANOL  SALICYLATE LEVEL    EKG  EKG Interpretation None       Radiology No results found.  Procedures Procedures (including critical care time)  Medications Ordered in ED Medications  buPROPion (WELLBUTRIN SR) 12 hr tablet 100 mg (not administered)  busPIRone (BUSPAR) tablet 20 mg (not administered)  carbamazepine (TEGRETOL) tablet 200 mg (not administered)  cloNIDine  (CATAPRES) tablet 0.1 mg (0.1 mg Oral Given 08/18/16 1419)  cyclobenzaprine (FLEXERIL) tablet 10 mg (10 mg Oral Given 08/18/16 1355)  gabapentin (NEURONTIN) capsule 800 mg (not administered)  mirtazapine (REMERON) tablet 15 mg (not administered)  QUEtiapine (SEROQUEL) tablet 150 mg (not administered)  LORazepam (ATIVAN) tablet 0-4 mg (2 mg Oral Given 08/18/16 1420)    Followed by  LORazepam (ATIVAN) tablet 0-4 mg (not administered)  thiamine (VITAMIN B-1) tablet 100 mg (100 mg Oral Given 08/18/16 1354)    Or  thiamine (B-1) injection 100 mg ( Intravenous See Alternative 08/18/16 1354)  LORazepam (ATIVAN) tablet 1 mg (not administered)  acetaminophen (TYLENOL) tablet 650 mg (not administered)  ibuprofen (ADVIL,MOTRIN) tablet 600 mg (600 mg Oral Given 08/18/16 1354)  zolpidem (AMBIEN) tablet 5 mg (not administered)  nicotine (NICODERM CQ - dosed in mg/24 hours) patch 21 mg (21 mg Transdermal Patch Applied 08/18/16 1420)  ondansetron (ZOFRAN) tablet 4 mg (not administered)  alum & mag hydroxide-simeth (MAALOX/MYLANTA) 200-200-20 MG/5ML suspension 30 mL (not administered)  fluticasone (FLONASE) 50 MCG/ACT nasal spray 2 spray (not administered)  loratadine (CLARITIN) tablet 10 mg (10 mg Oral Given 08/18/16 1411)     Initial Impression / Assessment and Plan / ED Course  I have reviewed the triage vital signs and the nursing notes.  Pertinent labs & imaging results that were available during my care of the patient were reviewed by me and considered in my medical decision making (see chart for details).     31 y.o. male here with SI with a plan, hx of prior attempts, also wanting EtOH detox and cocaine detox. +Smoker. Denies HI/AVH. Reports seasonal allergic rhinitis as well. No other medical complaints. On exam, mild sinus congestion and clear rhinorrhea, consistent with allergic rhinitis. Will give flonase and claritin for this, doubt need for further emergent work up at this time. No indication for abx. Pt  with very flat depressed affect. Will get clearance labs, reordered home meds as he reported them being taken. Smoking, alcohol, and cocaine cessation strongly encouraged. Will reassess shortly.  3:11 PM EtOH level undetectable. CBC w/diff WNL. CMP WNL. Salicylate and acetaminophen levels WNL. UDS with +cocaine. Pt medically cleared at this time. Psych hold orders and home med orders placed. Please see TTS notes for further documentation of care/dispo. PLEASE NOTE THAT PT IS HERE VOLUNTARILY AT THIS TIME, IF PT TRIES TO LEAVE THEY WOULD NEED IVC PAPERWORK TAKEN OUT. Pt stable at time of med clearance.     Final Clinical Impressions(s) / ED Diagnoses   Final diagnoses:  Suicidal ideation  Alcohol abuse  Cocaine use  Tobacco user  Allergic rhinitis, unspecified seasonality, unspecified trigger    New Prescriptions New Prescriptions   No medications on file     [address], PA-C 08/18/16 1511    Nira Conn, MD 08/19/16 1859

## 2016-08-18 NOTE — ED Triage Notes (Signed)
Homeless patient arrived by [employer] voluntary with suicidal ideation and plan to jump in front of a car.  Patient reports he is also here for ETOH withdrawal.  Last drink according to patient was last night.  Patient has been out of his psychiatric medications for several weeks.  He also reports he has a bad sinus infection.

## 2016-08-18 NOTE — ED Notes (Signed)
Tried to move patient to [facility], but he said he was too sleepy to walk at this time.  Offered food and fluid to patient.  He drank a small amount of lemonade then fell back asleep.
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
## 2016-08-18 NOTE — ED Notes (Addendum)
Pt ambulated in hallway.  Pt stated "I've not had any sleep but maybe 4 hours in 2 days.  I've been sleeping on the streets and I just ache all over.  I need more Flonase for my allergies."  Pt informed Flonase is only once a day.  Pt stated "I take it 6 times a day."

## 2016-08-18 NOTE — ED Notes (Signed)
Bed: WA29 Expected date:  Expected time:  Means of arrival:  Comments: Voluntary psych eval
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
## 2016-08-19 DIAGNOSIS — F6381 Intermittent explosive disorder: Secondary | ICD-10-CM | POA: Diagnosis not present

## 2016-08-19 DIAGNOSIS — F332 Major depressive disorder, recurrent severe without psychotic features: Secondary | ICD-10-CM | POA: Diagnosis not present

## 2016-08-19 DIAGNOSIS — F1414 Cocaine abuse with cocaine-induced mood disorder: Secondary | ICD-10-CM

## 2016-08-19 DIAGNOSIS — F1994 Other psychoactive substance use, unspecified with psychoactive substance-induced mood disorder: Secondary | ICD-10-CM

## 2016-08-19 MED ORDER — ASENAPINE MALEATE 5 MG SL SUBL: 10.0000 mg | SUBLINGUAL_TABLET | Freq: Two times a day (BID) | SUBLINGUAL | Status: DC

## 2016-08-19 MED ORDER — OLANZAPINE 10 MG PO TBDP: 10.0000 mg | ORAL_TABLET | Freq: Three times a day (TID) | ORAL | Status: DC | PRN

## 2016-08-19 MED ORDER — CARBAMAZEPINE 200 MG PO TABS
200.0000 mg | ORAL_TABLET | Freq: Two times a day (BID) | ORAL | Status: DC
  Administered 2016-08-19 – 2016-08-20 (×2): 200 mg via ORAL
  Filled 2016-08-19 (×2): qty 1

## 2016-08-19 MED ORDER — RISPERIDONE 2 MG PO TABS
2.0000 mg | ORAL_TABLET | Freq: Two times a day (BID) | ORAL | Status: DC
  Filled 2016-08-19 (×3): qty 1

## 2016-08-19 MED ORDER — BENZTROPINE MESYLATE 1 MG PO TABS
1.0000 mg | ORAL_TABLET | Freq: Two times a day (BID) | ORAL | Status: DC
  Filled 2016-08-19 (×3): qty 1

## 2016-08-19 MED ORDER — GABAPENTIN 300 MG PO CAPS
300.0000 mg | ORAL_CAPSULE | Freq: Three times a day (TID) | ORAL | Status: DC
  Administered 2016-08-19 – 2016-08-20 (×3): 300 mg via ORAL
  Filled 2016-08-19 (×3): qty 1

## 2016-08-19 MED ORDER — CARBAMAZEPINE 200 MG PO TABS: 400.0000 mg | ORAL_TABLET | Freq: Two times a day (BID) | ORAL | Status: DC

## 2016-08-19 NOTE — ED Notes (Signed)
Hourly rounding reveals patient sleeping in room. No complaints, stable, in no acute distress. Q15 minute rounds and monitoring via Security Cameras to continue. 

## 2016-08-19 NOTE — Progress Notes (Signed)
08/19/16 1342:  LRT went to pt room to offer activities, pt was sleep.  Caroll RancherMarjette Romanita Fager, LRT/CTRS

## 2016-08-19 NOTE — Discharge Instructions (Signed)
To help you maintain a sober lifestyle, a substance abuse treatment program may be beneficial to you.  Contact one of the following facilities at your earliest opportunity to ask about enrolling:  RESIDENTIAL PROGRAMS:       [facility]      [address]      [place], [place] [address]      [phone]       [facility]      [address]      [place], [place] [address]      [phone]       [facility]      [address]      [place], [place] [address]      [phone]) [phone]  OUTPATIENT PROGRAMS:       [facility]      [address]      [place], [place] [address]      [phone]      New patients are seen at the walk-in clinic every Tuesday from 9:00 am - 12:00 pm.   For your shelter needs, contact the following service providers:       [facility]      [address]      [place], [place] [address]      [phone]       [facility]      [address]      [place], [place] [address]      [phone]  For day shelter and other supportive services for the homeless, contact the [facility]):       [facility]      [address]      [place], [place] [address]      [phone]  For transitional housing, contact one of the following agencies.  They provide longer term housing than a shelter, but there is an application process:       [facility]      [address]      [place], [place] [address]      [phone]       [facility]      [address]      [place], [place] [address]      [phone]

## 2016-08-19 NOTE — ED Notes (Signed)
Report to include situation, background, assessment and recommendations from Laronica RN. Patient sleeping, respirations regular and unlabored. Q15 minute rounds and security camera observation to continue.   

## 2016-08-19 NOTE — ED Notes (Signed)
[facility]  Patient endorses suicidal ideations and attempted to tie a sheet around his neck.  Patient currently is being observed for safety.

## 2016-08-19 NOTE — ED Notes (Addendum)
Call light removed from room. Pt verbally upset requesting inpatient treatment. Pt laid back down in bed

## 2016-08-19 NOTE — BH Assessment (Signed)
[facility] Assessment Progress Note  Per Thedore MinsMojeed Akintayo, MD, this pt does not require psychiatric hospitalization at this time.  [facility] with referral information for area substance abuse treatment providers, and for providers of supportive services for the homeless.  This has been included in pt's discharge instructions.  Pt's nurse has been notified.  Doylene Canninghomas Shali Vesey, MA Triage Specialist [phone]

## 2016-08-19 NOTE — ED Notes (Addendum)
After speaking to TTS pt stood on bed and began twisting his sheet as if he was going to attempt to hang himself. This NT talked patient back down and patient sat back on the bed placing his head in his hands. RN Verlon AuLeslie made aware.

## 2016-08-19 NOTE — ED Notes (Signed)
Pt asleep. Did not awake for vital signs RN notified.

## 2016-08-19 NOTE — ED Notes (Signed)
Patient tied sheet around his neck in attempt to hang himself after hearing he was being discharged. Sheet was quickly untied and all sheets and blankets were removed from patients room. RN Verlon AuLeslie contacted Catha NottinghamJamison and was told [facility].

## 2016-08-20 NOTE — ED Notes (Signed)
Hourly rounding reveals patient sleeping in room. No complaints, stable, in no acute distress. Q15 minute rounds and monitoring via Security Cameras to continue. 

## 2016-08-20 NOTE — Consult Note (Signed)
[name]   Reason for Consult:  Substance abuse with suicidal ideations Referring Physician:  EDP Patient Identification: Theodore Carr MRN:  [id number] Principal Diagnosis: Intermittent explosive disorder Diagnosis:   Patient Active Problem List   Diagnosis Date Noted  . Intermittent explosive disorder [F63.81] 06/14/2016    Priority: High  . Cocaine abuse with cocaine-induced mood disorder [facility]) [F14.14] 07/16/2015    Priority: High  . Cocaine use disorder, severe, dependence ([place]) [F14.20] 06/14/2016  . Polysubstance dependence including opioid drug with daily use ([place]) [F19.20] 07/16/2015    Total Time spent with patient: 30 minutes  Subjective:   Theodore Carr is a 31 y.o. male patient does not warrant admission.  HPI:  31 yo male who presented to the ED with suicidal ideations after using cocaine.  He was very demanding on initial assessment telling the TTS Counselor that he had to be admitted because he was saying he was suicidal. Demanded Flexeril and Ativan, not appropriate for cocaine withdrawal.  Irritable, angry.  Reports he lost his housing 3 days ago because his roommate wanted him to buy toilet paper and he did not feel it was his turn, roommate evicted him.  Upset he was going to be discharged as he was saying we had to keep him and threatened he was going to hurt himself so he could stay.  Past Psychiatric History: substance abuse, anger issues  Risk to Self: None Risk to Others: None Prior Inpatient Therapy: Prior Inpatient Therapy: Yes Prior Therapy Dates:  ("last year") Prior Therapy Facilty/Provider(s):  [facility]) Reason for Treatment:  (suicidal and homicidal thoughts ) Prior Outpatient Therapy: Prior Outpatient Therapy: Yes Prior Therapy Dates:  ([facility] building -[facility] ) Prior Therapy Facilty/Provider(s):  ("[facility] Reason for Treatment:  (therapy) Does patient have an ACCT team?: No Does patient have Intensive  In-House Services?  : No Does patient have [facility] services? : No Does patient have P4CC services?: No  Past Medical History:  Past Medical History:  Diagnosis Date  . Compression fracture of T12 vertebra ([place]) 2008  . Hypertension    History reviewed. No pertinent surgical history. Family History: No family history on file. Family Psychiatric  History: unknown Social History:  History  Alcohol Use No     History  Drug Use No    Comment: not in 2 years    Social History   Social History  . Marital status: Single    Spouse name: N/A  . Number of children: N/A  . Years of education: N/A   Social History Main Topics  . Smoking status: Current Every Day Smoker    Packs/day: 0.50    Types: Cigarettes  . Smokeless tobacco: None  . Alcohol use No  . Drug use: No     Comment: not in 2 years  . Sexual activity: Yes   Other Topics Concern  . None   Social History Narrative  . None   Additional Social History:    Allergies:   Allergies  Allergen Reactions  . Amoxicillin Other (See Comments)    Reaction:  Unknown; childhood reaction Has patient had a PCN reaction causing immediate rash, facial/tongue/throat swelling, SOB or lightheadedness with hypotension: Unsure Has patient had a PCN reaction causing severe rash involving mucus membranes or skin necrosis: Unsure Has patient had a PCN reaction that required hospitalization Unsure Has patient had a PCN reaction occurring within the last 10 years: No If all of the above answers are "  NO", then may proceed with Cephalosporin use.   . Haloperidol And Related Other (See Comments)    Pt states that this medication locks his body up.      Labs:  Results for orders placed or performed during the hospital encounter of 08/18/16 (from the past 48 hour(s))  CBC w/diff     Status: None   Collection Time: 08/18/16  1:40 PM  Result Value Ref Range   WBC 10.5 4.0 - 10.5 K/uL   RBC 4.95 4.22 - 5.81 MIL/uL   Hemoglobin 15.5  13.0 - 17.0 g/dL   HCT 44.7 39.0 - 52.0 %   MCV 90.3 78.0 - 100.0 fL   MCH 31.3 26.0 - 34.0 pg   MCHC 34.7 30.0 - 36.0 g/dL   RDW 13.5 11.5 - 15.5 %   Platelets 313 150 - 400 K/uL   Neutrophils Relative % 58 %   Neutro Abs 6.2 1.7 - 7.7 K/uL   Lymphocytes Relative 25 %   Lymphs Abs 2.6 0.7 - 4.0 K/uL   Monocytes Relative 10 %   Monocytes Absolute 1.0 0.1 - 1.0 K/uL   Eosinophils Relative 7 %   Eosinophils Absolute 0.7 0.0 - 0.7 K/uL   Basophils Relative 0 %   Basophils Absolute 0.0 0.0 - 0.1 K/uL  Comprehensive metabolic panel     Status: Abnormal   Collection Time: 08/18/16  1:40 PM  Result Value Ref Range   Sodium 141 135 - 145 mmol/L   Potassium 3.6 3.5 - 5.1 mmol/L   Chloride 107 101 - 111 mmol/L   CO2 24 22 - 32 mmol/L   Glucose, Bld 120 (H) 65 - 99 mg/dL   BUN 14 6 - 20 mg/dL   Creatinine, Ser 1.19 0.61 - 1.24 mg/dL   Calcium 9.6 8.9 - 10.3 mg/dL   Total Protein 6.9 6.5 - 8.1 g/dL   Albumin 4.5 3.5 - 5.0 g/dL   AST 27 15 - 41 U/L   ALT 19 17 - 63 U/L   Alkaline Phosphatase 49 38 - 126 U/L   Total Bilirubin 1.0 0.3 - 1.2 mg/dL   GFR calc non Af Amer >60 >60 mL/min   GFR calc Af Amer >60 >60 mL/min    Comment: (NOTE) The eGFR has been calculated using the CKD EPI equation. This calculation has not been validated in all clinical situations. eGFR's persistently <60 mL/min signify possible Chronic Kidney Disease.    Anion gap 10 5 - 15  Ethanol     Status: None   Collection Time: 08/18/16  1:40 PM  Result Value Ref Range   Alcohol, Ethyl (B) <5 <5 mg/dL    Comment:        LOWEST DETECTABLE LIMIT FOR SERUM ALCOHOL IS 5 mg/dL FOR MEDICAL PURPOSES ONLY   Salicylate level     Status: None   Collection Time: 08/18/16  1:40 PM  Result Value Ref Range   Salicylate Lvl <1.0 2.8 - 30.0 mg/dL  Acetaminophen level     Status: Abnormal   Collection Time: 08/18/16  1:40 PM  Result Value Ref Range   Acetaminophen (Tylenol), Serum <10 (L) 10 - 30 ug/mL    Comment:         THERAPEUTIC CONCENTRATIONS VARY SIGNIFICANTLY. A RANGE OF 10-30 ug/mL MAY BE AN EFFECTIVE CONCENTRATION FOR MANY PATIENTS. HOWEVER, SOME ARE BEST TREATED AT CONCENTRATIONS OUTSIDE THIS RANGE. ACETAMINOPHEN CONCENTRATIONS >150 ug/mL AT 4 HOURS AFTER INGESTION AND >50 ug/mL AT 12 HOURS AFTER INGESTION  ARE OFTEN ASSOCIATED WITH TOXIC REACTIONS.   Urine rapid drug screen (hosp performed)     Status: Abnormal   Collection Time: 08/18/16  1:45 PM  Result Value Ref Range   Opiates NONE DETECTED NONE DETECTED   Cocaine POSITIVE (A) NONE DETECTED   Benzodiazepines NONE DETECTED NONE DETECTED   Amphetamines NONE DETECTED NONE DETECTED   Tetrahydrocannabinol NONE DETECTED NONE DETECTED   Barbiturates NONE DETECTED NONE DETECTED    Comment:        DRUG SCREEN FOR MEDICAL PURPOSES ONLY.  IF CONFIRMATION IS NEEDED FOR ANY PURPOSE, NOTIFY LAB WITHIN 5 DAYS.        LOWEST DETECTABLE LIMITS FOR URINE DRUG SCREEN Drug Class       Cutoff (ng/mL) Amphetamine      1000 Barbiturate      200 Benzodiazepine   329 Tricyclics       191 Opiates          300 Cocaine          300 THC              50     No current facility-administered medications for this encounter.    Current Outpatient Prescriptions  Medication Sig Dispense Refill  . buPROPion (WELLBUTRIN SR) 100 MG 12 hr tablet Take 1 tablet (100 mg total) by mouth 2 (two) times daily. 60 tablet 0  . busPIRone (BUSPAR) 10 MG tablet Take 30 mg by mouth 3 (three) times daily.     . carbamazepine (TEGRETOL) 200 MG tablet Take 200 mg by mouth 3 (three) times daily.    . cloNIDine (CATAPRES) 0.1 MG tablet Take 1 tablet (0.1 mg total) by mouth 2 (two) times daily. 60 tablet 0  . cyclobenzaprine (FLEXERIL) 10 MG tablet Take 10 mg by mouth 3 (three) times daily as needed for muscle spasms.    [name] gabapentin (NEURONTIN) 300 MG capsule Take 1,200 mg by mouth 2 (two) times daily.    . QUEtiapine (SEROQUEL) 100 MG tablet Take 100 mg by mouth at  bedtime.      Musculoskeletal: Strength & Muscle Tone: within normal limits Gait & Station: normal Patient leans: N/A  Psychiatric Specialty Exam: Physical Exam  Constitutional: He is oriented to person, place, and time. He appears well-developed and well-nourished.  HENT:  Head: Normocephalic.  Neck: Normal range of motion.  Respiratory: Effort normal.  Musculoskeletal: Normal range of motion.  Neurological: He is alert and oriented to person, place, and time.  Psychiatric: He has a normal mood and affect. His speech is normal and behavior is normal. Judgment and thought content normal. Cognition and memory are normal.    Review of Systems  Psychiatric/Behavioral: Positive for substance abuse.  All other systems reviewed and are negative.   Blood pressure 104/62, pulse 82, temperature 97.6 F (36.4 C), temperature source Oral, resp. rate 18, height _0  (1.702 m), weight 70.8 kg (156 lb), SpO2 99 %.Body mass index is 24.43 kg/m.  General Appearance: Dishelved  Eye Contact:  Good  Speech:  Normal Rate  Volume:  Normal  Mood:  Angry, demanding  Affect:  Congruent  Thought Process:  Coherent and Descriptions of Associations: Intact  Orientation:  Full (Time, Place, and Person)  Thought Content:  WDL and Logical  Suicidal Thoughts:  No  Homicidal Thoughts:  No  Memory:  Immediate;   Good Recent;   Good Remote;   Good  Judgement:  Fair  Insight:  Fair  Psychomotor Activity:  Normal  Concentration:  Concentration: Good and Attention Span: Good  Recall:  Good  Fund of Knowledge:  Fair  Language:  Good  Akathisia:  No  Handed:  Right  AIMS (if indicated):     Assets:  Leisure Time Physical Health Resilience Social Support  ADL's:  Intact  Cognition:  WNL  Sleep:        Treatment Plan Summary: Daily contact with patient to assess and evaluate symptoms and progress in treatment, Medication management and Plan cocaine abuse with cocaine induced mood disorder:   -Crisis stabilization -Medication management:  Started Risperdal 2 mg BID for irritability and mood, Gabapentin 300 mg TID for mood stabilization, and Tegretol 200 mg BID for mood stabilization -Individual counseling  Disposition:  He was to be discharged as his issue was substance abuse and losing his housing 3 days ago.  Kept over night for safety observation.  Waylan Boga, NP 08/20/2016 1:18 PM  Patient seen face-to-face for psychiatric evaluation, chart reviewed and case discussed with the physician extender and developed treatment plan. Reviewed the information documented and agree with the treatment plan. Corena Pilgrim, MD

## 2016-08-20 NOTE — [facility] Suicide Risk Assessment (Signed)
Suicide Risk Assessment  Discharge Assessment   [facility]   Principal Problem: Intermittent explosive disorder Discharge Diagnoses:  Patient Active Problem List   Diagnosis Date Noted  . Intermittent explosive disorder [F63.81] 06/14/2016    Priority: High  . Cocaine abuse with cocaine-induced mood disorder [facility]) [F14.14] 07/16/2015    Priority: High  . Cocaine use disorder, severe, dependence ([facility]) [F14.20] 06/14/2016  . Polysubstance dependence including opioid drug with daily use ([facility]) [F19.20] 07/16/2015    Total Time spent with patient: 45 minutes   Musculoskeletal: Strength & Muscle Tone: within normal limits Gait & Station: normal Patient leans: N/A  Psychiatric Specialty Exam: Physical Exam  Constitutional: He is oriented to person, place, and time. He appears well-developed and well-nourished.  HENT:  Head: Normocephalic.  Neck: Normal range of motion.  Respiratory: Effort normal.  Musculoskeletal: Normal range of motion.  Neurological: He is alert and oriented to person, place, and time.  Psychiatric: He has a normal mood and affect. His speech is normal and behavior is normal. Judgment and thought content normal. Cognition and memory are normal.    Review of Systems  Psychiatric/Behavioral: Positive for substance abuse.  All other systems reviewed and are negative.   Blood pressure 104/62, pulse 82, temperature 97.6 F (36.4 C), temperature source Oral, resp. rate 18, height 5\' 7"  (1.702 m), weight 70.8 kg (156 lb), SpO2 99 %.Body mass index is 24.43 kg/m.  General Appearance: Casual  Eye Contact:  Good  Speech:  Normal Rate  Volume:  Normal  Mood:  Irritable  Affect:  Congruent  Thought Process:  Coherent and Descriptions of Associations: Intact  Orientation:  Full (Time, Place, and Person)  Thought Content:  WDL and Logical  Suicidal Thoughts:  No  Homicidal Thoughts:  No  Memory:  Immediate;   Good Recent;    Good Remote;   Good  Judgement:  Fair  Insight:  Fair  Psychomotor Activity:  Normal  Concentration:  Concentration: Good and Attention Span: Good  Recall:  Good  Fund of Knowledge:  Fair  Language:  Good  Akathisia:  No  Handed:  Right  AIMS (if indicated):     Assets:  Leisure Time Physical Health Resilience Social Support  ADL's:  Intact  Cognition:  WNL  Sleep:       Mental Status Per Nursing Assessment::   On Admission:   suicidal ideations after cocaine abuse  Demographic Factors:  Male and Caucasian  Loss Factors: Legal issues  Historical Factors: NA  Risk Reduction Factors:   Sense of responsibility to family  Continued Clinical Symptoms:  Irritable  Cognitive Features That Contribute To Risk:  None    Suicide Risk:  Minimal: No identifiable suicidal ideation.  Patients presenting with no risk factors but with morbid ruminations; may be classified as minimal risk based on the severity of the depressive symptoms    Plan Of Care/Follow-up recommendations:  Activity:  as tolerated Diet:  heart healthy diet  Theodore Hershey, NP 08/20/2016, 12:54 PM

## 2016-08-20 NOTE — ED Notes (Signed)
Pt has been demanding towards staff this morning. He has not expressed SI or HI. He does not appear to be depressed. He expressed resentment for having boundaries set, such as how often the TV channel is changed.

## 2016-08-20 NOTE — ED Notes (Signed)
Pt refused risperidone and benztropine mesylate that has been ordered by the doctor for him and was annoyed that Wellbutrin had not been ordered for him.

## 2016-08-20 NOTE — Consult Note (Signed)
[facility] Consult   Reason for Consult:  Substance abuse with suicidal ideations Referring Physician:  EDP Patient Identification: Theodore Carr MRN:  [id number] Principal Diagnosis: Intermittent explosive disorder Diagnosis:   Patient Active Problem List   Diagnosis Date Noted  . Intermittent explosive disorder [F63.81] 06/14/2016    Priority: High  . Cocaine abuse with cocaine-induced mood disorder [facility]) [F14.14] 07/16/2015    Priority: High  . Cocaine use disorder, severe, dependence ([place]) [F14.20] 06/14/2016  . Polysubstance dependence including opioid drug with daily use ([place]) [F19.20] 07/16/2015    Total Time spent with patient: 30 minutes  Subjective:   Theodore Carr is a 31 y.o. male patient does not warrant admission.  HPI:  31 yo male who presented to the ED with suicidal ideations after using cocaine.  He was very demanding on initial assessment telling the TTS Counselor that he had to be admitted because he was saying he was suicidal. Demanded Flexeril and benzodiazepines, other medications provided to meet his needs for detox.  Rudely making demands of staff.  He slept soundly yesterday afternoon through the night, difficult to awaken yesterday afternoon.  Never verbalized suicidal ideations in the TCU and at his baseline.  Discharged with outpatient resources for substance abuse.  Prior admission with similar presentations, last admission at [facility] he had to be escorted out by the police.    Past Psychiatric History: substance abuse, anger issues  Risk to Self: None Risk to Others: None Prior Inpatient Therapy: Prior Inpatient Therapy: Yes Prior Therapy Dates:  ("last year") Prior Therapy Facilty/Provider(s):  [facility]) Reason for Treatment:  (suicidal and homicidal thoughts ) Prior Outpatient Therapy: Prior Outpatient Therapy: Yes Prior Therapy Dates:  ([facility] building -current ) Prior Therapy Facilty/Provider(s):  ("I see a counselor at the [facility]") Reason for  Treatment:  (therapy) Does patient have an ACCT team?: No Does patient have Intensive In-House Services?  : No Does patient have [facility] services? : No Does patient have P4CC services?: No  Past Medical History:  Past Medical History:  Diagnosis Date  . Compression fracture of T12 vertebra ([place]) 2008  . Hypertension    History reviewed. No pertinent surgical history. Family History: No family history on file. Family Psychiatric  History: unknown Social History:  History  Alcohol Use No     History  Drug Use No    Comment: not in 2 years    Social History   Social History  . Marital status: Single    Spouse name: N/A  . Number of children: N/A  . Years of education: N/A   Social History Main Topics  . Smoking status: Current Every Day Smoker    Packs/day: 0.50    Types: Cigarettes  . Smokeless tobacco: None  . Alcohol use No  . Drug use: No     Comment: not in 2 years  . Sexual activity: Yes   Other Topics Concern  . None   Social History Narrative  . None   Additional Social History:    Allergies:   Allergies  Allergen Reactions  . Amoxicillin Other (See Comments)    Reaction:  Unknown; childhood reaction Has patient had a PCN reaction causing immediate rash, facial/tongue/throat swelling, SOB or lightheadedness with hypotension: Unsure Has patient had a PCN reaction causing severe rash involving mucus membranes or skin necrosis: Unsure Has patient had a PCN reaction that required hospitalization Unsure Has patient had a PCN reaction occurring within the last 10 years: No If all of the  above answers are "NO", then may proceed with Cephalosporin use.   . Haloperidol And Related Other (See Comments)    Pt states that this medication locks his body up.      Labs:  Results for orders placed or performed during the hospital encounter of 08/18/16 (from the past 48 hour(s))  CBC w/diff     Status: None   Collection Time: 08/18/16  1:40 PM  Result Value Ref  Range   WBC 10.5 4.0 - 10.5 K/uL   RBC 4.95 4.22 - 5.81 MIL/uL   Hemoglobin 15.5 13.0 - 17.0 g/dL   HCT 44.7 39.0 - 52.0 %   MCV 90.3 78.0 - 100.0 fL   MCH 31.3 26.0 - 34.0 pg   MCHC 34.7 30.0 - 36.0 g/dL   RDW 13.5 11.5 - 15.5 %   Platelets 313 150 - 400 K/uL   Neutrophils Relative % 58 %   Neutro Abs 6.2 1.7 - 7.7 K/uL   Lymphocytes Relative 25 %   Lymphs Abs 2.6 0.7 - 4.0 K/uL   Monocytes Relative 10 %   Monocytes Absolute 1.0 0.1 - 1.0 K/uL   Eosinophils Relative 7 %   Eosinophils Absolute 0.7 0.0 - 0.7 K/uL   Basophils Relative 0 %   Basophils Absolute 0.0 0.0 - 0.1 K/uL  Comprehensive metabolic panel     Status: Abnormal   Collection Time: 08/18/16  1:40 PM  Result Value Ref Range   Sodium 141 135 - 145 mmol/L   Potassium 3.6 3.5 - 5.1 mmol/L   Chloride 107 101 - 111 mmol/L   CO2 24 22 - 32 mmol/L   Glucose, Bld 120 (H) 65 - 99 mg/dL   BUN 14 6 - 20 mg/dL   Creatinine, Ser 1.19 0.61 - 1.24 mg/dL   Calcium 9.6 8.9 - 10.3 mg/dL   Total Protein 6.9 6.5 - 8.1 g/dL   Albumin 4.5 3.5 - 5.0 g/dL   AST 27 15 - 41 U/L   ALT 19 17 - 63 U/L   Alkaline Phosphatase 49 38 - 126 U/L   Total Bilirubin 1.0 0.3 - 1.2 mg/dL   GFR calc non Af Amer >60 >60 mL/min   GFR calc Af Amer >60 >60 mL/min    Comment: (NOTE) The eGFR has been calculated using the CKD EPI equation. This calculation has not been validated in all clinical situations. eGFR's persistently <60 mL/min signify possible Chronic Kidney Disease.    Anion gap 10 5 - 15  Ethanol     Status: None   Collection Time: 08/18/16  1:40 PM  Result Value Ref Range   Alcohol, Ethyl (B) <5 <5 mg/dL    Comment:        LOWEST DETECTABLE LIMIT FOR SERUM ALCOHOL IS 5 mg/dL FOR MEDICAL PURPOSES ONLY   Salicylate level     Status: None   Collection Time: 08/18/16  1:40 PM  Result Value Ref Range   Salicylate Lvl <2.2 2.8 - 30.0 mg/dL  Acetaminophen level     Status: Abnormal   Collection Time: 08/18/16  1:40 PM  Result Value  Ref Range   Acetaminophen (Tylenol), Serum <10 (L) 10 - 30 ug/mL    Comment:        THERAPEUTIC CONCENTRATIONS VARY SIGNIFICANTLY. A RANGE OF 10-30 ug/mL MAY BE AN EFFECTIVE CONCENTRATION FOR MANY PATIENTS. HOWEVER, SOME ARE BEST TREATED AT CONCENTRATIONS OUTSIDE THIS RANGE. ACETAMINOPHEN CONCENTRATIONS >150 ug/mL AT 4 HOURS AFTER INGESTION AND >50 ug/mL AT 12  HOURS AFTER INGESTION ARE OFTEN ASSOCIATED WITH TOXIC REACTIONS.   Urine rapid drug screen (hosp performed)     Status: Abnormal   Collection Time: 08/18/16  1:45 PM  Result Value Ref Range   Opiates NONE DETECTED NONE DETECTED   Cocaine POSITIVE (A) NONE DETECTED   Benzodiazepines NONE DETECTED NONE DETECTED   Amphetamines NONE DETECTED NONE DETECTED   Tetrahydrocannabinol NONE DETECTED NONE DETECTED   Barbiturates NONE DETECTED NONE DETECTED    Comment:        DRUG SCREEN FOR MEDICAL PURPOSES ONLY.  IF CONFIRMATION IS NEEDED FOR ANY PURPOSE, NOTIFY LAB WITHIN 5 DAYS.        LOWEST DETECTABLE LIMITS FOR URINE DRUG SCREEN Drug Class       Cutoff (ng/mL) Amphetamine      1000 Barbiturate      200 Benzodiazepine   388 Tricyclics       828 Opiates          300 Cocaine          300 THC              50     Current Facility-Administered Medications  Medication Dose Route Frequency Provider Last Rate Last Dose  . acetaminophen (TYLENOL) tablet 650 mg  650 mg Oral Q4H PRN Street, [place], [place]      . alum & mag hydroxide-simeth (MAALOX/MYLANTA) 200-200-20 MG/5ML suspension 30 mL  30 mL Oral PRN Street, [place], [place]      . benztropine (COGENTIN) tablet 1 mg  1 mg Oral BID Adonias Demore, MD      . carbamazepine (TEGRETOL) tablet 200 mg  200 mg Oral BID Patrecia Pour, NP   200 mg at 08/20/16 0034  . cloNIDine (CATAPRES) tablet 0.1 mg  0.1 mg Oral BID Street, [place], [place]   0.1 mg at 08/20/16 9179  . fluticasone (FLONASE) 50 MCG/ACT nasal spray 2 spray  2 spray Each Nare Daily [address], [place], [place]   2 spray  at 08/18/16 1641  . gabapentin (NEURONTIN) capsule 300 mg  300 mg Oral TID Corena Pilgrim, MD   300 mg at 08/20/16 0939  . ibuprofen (ADVIL,MOTRIN) tablet 600 mg  600 mg Oral Q8H PRN Street, [place], [place]   600 mg at 08/20/16 0458  . loratadine (CLARITIN) tablet 10 mg  10 mg Oral Daily [address] [place], [place]   10 mg at 08/20/16 1505  . nicotine (NICODERM CQ - dosed in mg/24 hours) patch 21 mg  21 mg Transdermal Daily [address], [place], [place]   21 mg at 08/19/16 1342  . OLANZapine zydis (ZYPREXA) disintegrating tablet 10 mg  10 mg Oral Q8H PRN Cyruss Arata, MD      . ondansetron (ZOFRAN) tablet 4 mg  4 mg Oral Q8H PRN Street, [place], PA-C      . risperiDONE (RISPERDAL) tablet 2 mg  2 mg Oral BID Corena Pilgrim, MD       Current Outpatient Prescriptions  Medication Sig Dispense Refill  . buPROPion (WELLBUTRIN SR) 100 MG 12 hr tablet Take 1 tablet (100 mg total) by mouth 2 (two) times daily. 60 tablet 0  . busPIRone (BUSPAR) 10 MG tablet Take 30 mg by mouth 3 (three) times daily.     . carbamazepine (TEGRETOL) 200 MG tablet Take 200 mg by mouth 3 (three) times daily.    . cloNIDine (CATAPRES) 0.1 MG tablet Take 1 tablet (0.1 mg total) by mouth 2 (two) times daily. 60 tablet 0  . cyclobenzaprine (  FLEXERIL) 10 MG tablet Take 10 mg by mouth 3 (three) times daily as needed for muscle spasms.    [name] gabapentin (NEURONTIN) 300 MG capsule Take 1,200 mg by mouth 2 (two) times daily.    . QUEtiapine (SEROQUEL) 100 MG tablet Take 100 mg by mouth at bedtime.      Musculoskeletal: Strength & Muscle Tone: within normal limits Gait & Station: normal Patient leans: N/A  Psychiatric Specialty Exam: Physical Exam  Constitutional: He is oriented to person, place, and time. He appears well-developed and well-nourished.  HENT:  Head: Normocephalic.  Neck: Normal range of motion.  Respiratory: Effort normal.  Musculoskeletal: Normal range of motion.  Neurological: He is alert and oriented to person,  place, and time.  Psychiatric: He has a normal mood and affect. His speech is normal and behavior is normal. Judgment and thought content normal. Cognition and memory are normal.    Review of Systems  Psychiatric/Behavioral: Positive for substance abuse.  All other systems reviewed and are negative.   Blood pressure 104/62, pulse 82, temperature 97.6 F (36.4 C), temperature source Oral, resp. rate 18, height 5' 7"  (1.702 m), weight 70.8 kg (156 lb), SpO2 99 %.Body mass index is 24.43 kg/m.  General Appearance: Casual  Eye Contact:  Good  Speech:  Normal Rate  Volume:  Normal  Mood:  Irritable  Affect:  Congruent  Thought Process:  Coherent and Descriptions of Associations: Intact  Orientation:  Full (Time, Place, and Person)  Thought Content:  WDL and Logical  Suicidal Thoughts:  No  Homicidal Thoughts:  No  Memory:  Immediate;   Good Recent;   Good Remote;   Good  Judgement:  Fair  Insight:  Fair  Psychomotor Activity:  Normal  Concentration:  Concentration: Good and Attention Span: Good  Recall:  Good  Fund of Knowledge:  Fair  Language:  Good  Akathisia:  No  Handed:  Right  AIMS (if indicated):     Assets:  Leisure Time Physical Health Resilience Social Support  ADL's:  Intact  Cognition:  WNL  Sleep:        Treatment Plan Summary: Daily contact with patient to assess and evaluate symptoms and progress in treatment, Medication management and Plan cocaine abuse with cocaine induced mood disorder:  -Crisis stabilization -Medication management:  Started Risperdal 2 mg BID for irritability and mood, Gabapentin 300 mg TID for mood stabilization, and Tegretol 200 mg BID for mood stabilization -Individual counseling  Disposition: No evidence of imminent risk to self or others at present.    Waylan Boga, NP 08/20/2016 12:19 PM  Patient seen face-to-face for psychiatric evaluation, chart reviewed and case discussed with the physician extender and developed treatment  plan. Reviewed the information documented and agree with the treatment plan. Corena Pilgrim, MD

## 2016-08-20 NOTE — ED Notes (Signed)
Pt was discharged with a show of support. He tried to refuse to leave. All belongings returned to pt.

## 2016-08-20 NOTE — ED Notes (Signed)
Hourly rounding reveals patient in room. No complaints, stable, in no acute distress. Q15 minute rounds and monitoring via Security Cameras to continue. 

## 2016-08-29 ENCOUNTER — Encounter (HOSPITAL_COMMUNITY): Payer: Self-pay

## 2016-08-29 ENCOUNTER — Inpatient Hospital Stay (HOSPITAL_COMMUNITY)
Admission: AD | Admit: 2016-08-29 | Discharge: 2016-09-01 | DRG: 885 | Disposition: A | Discharge status: Home / Self Care | Payer: No Typology Code available for payment source | Attending: Psychiatry | Admitting: Psychiatry

## 2016-08-29 DIAGNOSIS — I10 Essential (primary) hypertension: Secondary | ICD-10-CM | POA: Diagnosis present

## 2016-08-29 DIAGNOSIS — Z79899 Other long term (current) drug therapy: Secondary | ICD-10-CM | POA: Diagnosis not present

## 2016-08-29 DIAGNOSIS — F332 Major depressive disorder, recurrent severe without psychotic features: Principal | ICD-10-CM | POA: Diagnosis present

## 2016-08-29 DIAGNOSIS — F17210 Nicotine dependence, cigarettes, uncomplicated: Secondary | ICD-10-CM | POA: Diagnosis present

## 2016-08-29 DIAGNOSIS — G894 Chronic pain syndrome: Secondary | ICD-10-CM | POA: Diagnosis not present

## 2016-08-29 DIAGNOSIS — Z881 Allergy status to other antibiotic agents status: Secondary | ICD-10-CM

## 2016-08-29 DIAGNOSIS — F1420 Cocaine dependence, uncomplicated: Secondary | ICD-10-CM | POA: Diagnosis present

## 2016-08-29 DIAGNOSIS — Z818 Family history of other mental and behavioral disorders: Secondary | ICD-10-CM

## 2016-08-29 DIAGNOSIS — F1994 Other psychoactive substance use, unspecified with psychoactive substance-induced mood disorder: Secondary | ICD-10-CM | POA: Diagnosis present

## 2016-08-29 DIAGNOSIS — F19980 Other psychoactive substance use, unspecified with psychoactive substance-induced anxiety disorder: Secondary | ICD-10-CM | POA: Diagnosis present

## 2016-08-29 DIAGNOSIS — Z813 Family history of other psychoactive substance abuse and dependence: Secondary | ICD-10-CM | POA: Diagnosis not present

## 2016-08-29 HISTORY — DX: Major depressive disorder, single episode, unspecified: F32.9

## 2016-08-29 HISTORY — DX: Depression, unspecified: F32.A

## 2016-08-29 MED ORDER — CLONIDINE HCL 0.1 MG PO TABS
0.1000 mg | ORAL_TABLET | Freq: Every day | ORAL | Status: DC
  Administered 2016-08-29 – 2016-08-31 (×2): 0.1 mg via ORAL
  Filled 2016-08-29 (×4): qty 1
  Filled 2016-08-29: qty 30
  Filled 2016-08-29: qty 1

## 2016-08-29 MED ORDER — BUPROPION HCL ER (SR) 100 MG PO TB12
100.0000 mg | ORAL_TABLET | Freq: Two times a day (BID) | ORAL | Status: DC
  Administered 2016-08-29 – 2016-08-30 (×2): 100 mg via ORAL
  Filled 2016-08-29 (×5): qty 1

## 2016-08-29 MED ORDER — GABAPENTIN 300 MG PO CAPS
300.0000 mg | ORAL_CAPSULE | Freq: Three times a day (TID) | ORAL | Status: DC
  Administered 2016-08-29 – 2016-08-30 (×3): 300 mg via ORAL
  Filled 2016-08-29 (×6): qty 1

## 2016-08-29 MED ORDER — ACETAMINOPHEN 325 MG PO TABS: 650.0000 mg | ORAL_TABLET | Freq: Four times a day (QID) | ORAL | Status: DC | PRN

## 2016-08-29 MED ORDER — ZIPRASIDONE MESYLATE 20 MG IM SOLR: 20.0000 mg | INTRAMUSCULAR | Status: DC | PRN

## 2016-08-29 MED ORDER — CARBAMAZEPINE 200 MG PO TABS
200.0000 mg | ORAL_TABLET | Freq: Two times a day (BID) | ORAL | Status: DC
  Filled 2016-08-29 (×3): qty 1

## 2016-08-29 MED ORDER — QUETIAPINE FUMARATE 50 MG PO TABS
50.0000 mg | ORAL_TABLET | Freq: Every evening | ORAL | Status: DC | PRN
  Administered 2016-08-29: 50 mg via ORAL
  Filled 2016-08-29 (×4): qty 1

## 2016-08-29 MED ORDER — CLONIDINE HCL 0.1 MG PO TABS: 0.1000 mg | ORAL_TABLET | Freq: Two times a day (BID) | ORAL | Status: DC

## 2016-08-29 MED ORDER — LORAZEPAM 1 MG PO TABS
1.0000 mg | ORAL_TABLET | ORAL | Status: AC | PRN
  Administered 2016-08-30: 1 mg via ORAL
  Filled 2016-08-29: qty 1

## 2016-08-29 MED ORDER — NICOTINE POLACRILEX 2 MG MT GUM
2.0000 mg | CHEWING_GUM | OROMUCOSAL | Status: DC | PRN
  Administered 2016-08-30 – 2016-09-01 (×13): 2 mg via ORAL
  Filled 2016-08-29: qty 1
  Filled 2016-08-29: qty 10

## 2016-08-29 MED ORDER — RISPERIDONE 2 MG PO TBDP: 2.0000 mg | ORAL_TABLET | Freq: Three times a day (TID) | ORAL | Status: DC | PRN

## 2016-08-29 MED ORDER — OLANZAPINE 5 MG PO TBDP
5.0000 mg | ORAL_TABLET | Freq: Three times a day (TID) | ORAL | Status: DC | PRN
  Administered 2016-08-30: 5 mg via ORAL
  Filled 2016-08-29: qty 1

## 2016-08-29 MED ORDER — ALUM & MAG HYDROXIDE-SIMETH 200-200-20 MG/5ML PO SUSP: 30.0000 mL | ORAL | Status: DC | PRN

## 2016-08-29 MED ORDER — LORAZEPAM 1 MG PO TABS: 1.0000 mg | ORAL_TABLET | ORAL | Status: DC | PRN

## 2016-08-29 MED ORDER — MAGNESIUM HYDROXIDE 400 MG/5ML PO SUSP: 30.0000 mL | Freq: Every day | ORAL | Status: DC | PRN

## 2016-08-29 MED ORDER — HYDROXYZINE HCL 25 MG PO TABS
25.0000 mg | ORAL_TABLET | Freq: Four times a day (QID) | ORAL | Status: DC | PRN
  Administered 2016-08-31: 25 mg via ORAL
  Filled 2016-08-29: qty 1
  Filled 2016-08-29: qty 40

## 2016-08-29 MED ORDER — BUSPIRONE HCL 15 MG PO TABS
15.0000 mg | ORAL_TABLET | Freq: Three times a day (TID) | ORAL | Status: DC
  Administered 2016-08-30 – 2016-09-01 (×8): 15 mg via ORAL
  Filled 2016-08-29 (×4): qty 1
  Filled 2016-08-29 (×2): qty 90
  Filled 2016-08-29 (×5): qty 1
  Filled 2016-08-29: qty 90
  Filled 2016-08-29: qty 1

## 2016-08-29 MED ORDER — GABAPENTIN 300 MG PO CAPS: 300.0000 mg | ORAL_CAPSULE | Freq: Three times a day (TID) | ORAL | Status: DC

## 2016-08-29 NOTE — Plan of Care (Signed)
Problem: Medication: Goal: Compliance with prescribed medication regimen will improve Outcome: Progressing Notified Donell SievertSimon Spencer, PA on call for bedtime (medication) orders.

## 2016-08-29 NOTE — Tx Team (Signed)
Initial Treatment Plan 08/29/2016 10:56 PM Theodore BalesSteven Carr ZOX:[id number]    PATIENT STRESSORS: Financial difficulties Medication change or noncompliance Substance abuse Other: homeless   PATIENT STRENGTHS: Active sense of humor Communication skills General fund of knowledge Motivation for treatment/growth   PATIENT IDENTIFIED PROBLEMS: Depression   At risk for suicide   Substance-use   "Need to get on my medications"   "I've been living on the street"             DISCHARGE CRITERIA:  Ability to meet basic life and health needs Adequate post-discharge living arrangements Improved stabilization in mood, thinking, and/or behavior Motivation to continue treatment in a less acute level of care Reduction of life-threatening or endangering symptoms to within safe limits Verbal commitment to aftercare and medication compliance Withdrawal symptoms are absent or subacute and managed without 24-hour nursing intervention  PRELIMINARY DISCHARGE PLAN: Attend PHP/IOP Attend 12-step recovery group Outpatient therapy  PATIENT/FAMILY INVOLVEMENT: This treatment plan has been presented to and reviewed with the patient, Theodore Carr.The patient have been given the opportunity to ask questions and make suggestions.  Theodore AppleEmily  Theodore Sponsel, RN 08/29/2016, 10:56 PM
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
## 2016-08-30 ENCOUNTER — Encounter (HOSPITAL_COMMUNITY): Payer: Self-pay | Admitting: Psychiatry

## 2016-08-30 DIAGNOSIS — F332 Major depressive disorder, recurrent severe without psychotic features: Principal | ICD-10-CM | POA: Diagnosis present

## 2016-08-30 DIAGNOSIS — F1420 Cocaine dependence, uncomplicated: Secondary | ICD-10-CM

## 2016-08-30 DIAGNOSIS — F19980 Other psychoactive substance use, unspecified with psychoactive substance-induced anxiety disorder: Secondary | ICD-10-CM | POA: Diagnosis present

## 2016-08-30 DIAGNOSIS — F1994 Other psychoactive substance use, unspecified with psychoactive substance-induced mood disorder: Secondary | ICD-10-CM

## 2016-08-30 DIAGNOSIS — G894 Chronic pain syndrome: Secondary | ICD-10-CM | POA: Diagnosis present

## 2016-08-30 DIAGNOSIS — Z818 Family history of other mental and behavioral disorders: Secondary | ICD-10-CM

## 2016-08-30 HISTORY — DX: Chronic pain syndrome: G89.4

## 2016-08-30 LAB — TSH: TSH: 1.505 u[IU]/mL (ref 0.350–4.500)

## 2016-08-30 MED ORDER — GABAPENTIN 300 MG PO CAPS
300.0000 mg | ORAL_CAPSULE | Freq: Three times a day (TID) | ORAL | Status: DC
  Administered 2016-08-30 – 2016-09-01 (×8): 300 mg via ORAL
  Filled 2016-08-30 (×4): qty 1
  Filled 2016-08-30: qty 120
  Filled 2016-08-30 (×3): qty 1
  Filled 2016-08-30: qty 120
  Filled 2016-08-30 (×4): qty 1
  Filled 2016-08-30 (×2): qty 120

## 2016-08-30 MED ORDER — QUETIAPINE FUMARATE 25 MG PO TABS: 25.0000 mg | ORAL_TABLET | Freq: Three times a day (TID) | ORAL | Status: DC | PRN

## 2016-08-30 MED ORDER — CARBAMAZEPINE 200 MG PO TABS
200.0000 mg | ORAL_TABLET | Freq: Three times a day (TID) | ORAL | Status: DC
  Administered 2016-08-30 – 2016-09-01 (×7): 200 mg via ORAL
  Filled 2016-08-30: qty 90
  Filled 2016-08-30 (×4): qty 1
  Filled 2016-08-30: qty 90
  Filled 2016-08-30 (×3): qty 1
  Filled 2016-08-30: qty 90
  Filled 2016-08-30 (×2): qty 1

## 2016-08-30 MED ORDER — CARBAMAZEPINE 200 MG PO TABS
200.0000 mg | ORAL_TABLET | Freq: Two times a day (BID) | ORAL | Status: DC
  Filled 2016-08-30 (×2): qty 1

## 2016-08-30 MED ORDER — BUPROPION HCL ER (XL) 150 MG PO TB24
150.0000 mg | ORAL_TABLET | Freq: Every day | ORAL | Status: DC
  Administered 2016-08-31 – 2016-09-01 (×2): 150 mg via ORAL
  Filled 2016-08-30 (×3): qty 1
  Filled 2016-08-30: qty 30

## 2016-08-30 MED ORDER — QUETIAPINE FUMARATE 50 MG PO TABS
50.0000 mg | ORAL_TABLET | Freq: Every day | ORAL | Status: DC
  Administered 2016-08-30 – 2016-08-31 (×2): 50 mg via ORAL
  Filled 2016-08-30: qty 30
  Filled 2016-08-30 (×3): qty 1

## 2016-08-30 MED ORDER — TUBERCULIN PPD 5 UNIT/0.1ML ID SOLN
5.0000 [IU] | Freq: Once | INTRADERMAL | Status: DC
  Administered 2016-08-30: 5 [IU] via INTRADERMAL

## 2016-08-30 MED ORDER — CYCLOBENZAPRINE HCL 10 MG PO TABS
10.0000 mg | ORAL_TABLET | Freq: Three times a day (TID) | ORAL | Status: DC | PRN
  Administered 2016-08-30 – 2016-09-01 (×7): 10 mg via ORAL
  Filled 2016-08-30: qty 30
  Filled 2016-08-30 (×8): qty 1

## 2016-08-30 NOTE — Progress Notes (Signed)
Nutrition Brief Note  Patient identified on the Malnutrition Screening Tool (MST) Report  Wt Readings from Last 15 Encounters:  08/29/16 151 lb (68.5 kg)  08/18/16 156 lb (70.8 kg)  07/16/15 153 lb (69.4 kg)    Body mass index is 23.65 kg/m. Patient meets criteria for normal weight based on current BMI. Pt reports losing weight since 06/07/16 but unable to confirm any weight loss within the past 14 months. Skin WDL.   Pt admitted following SI with plan to hang himself and request to detox from cocaine. Current diet order is Regular and pt is eating as desired for meals and snacks. Labs and medications reviewed. No nutrition interventions warranted at this time. If nutrition issues arise, please consult RD.     Trenton GammonJessica Viet Kemmerer, MS, RD, LDN, [facility] Inpatient Clinical Dietitian Pager # [phone] After hours/weekend pager # [phone]
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
## 2016-08-30 NOTE — Tx Team (Signed)
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 acetaminophen (TYLENOL) tablet 650 mg  650 mg Oral Q6H PRN Laverle Hobby, PA-C      . alum & mag hydroxide-simeth (MAALOX/MYLANTA) 200-200-20 MG/5ML suspension 30 mL  30 mL Oral Q4H PRN Laverle Hobby, PA-C      . [START ON 08/31/2016] buPROPion (WELLBUTRIN XL) 24 hr tablet 150 mg  150 mg Oral Daily Nwoko, Agnes I, NP      . busPIRone (BUSPAR) tablet 15 mg  15 mg Oral TID Patriciaann Clan E, PA-C   15 mg at 08/30/16 1147  . carbamazepine (TEGRETOL) tablet 200 mg  200 mg Oral TID Lindell Spar I, NP   200 mg at 08/30/16 1153  . cloNIDine (CATAPRES) tablet 0.1 mg  0.1 mg Oral QHS Patriciaann Clan E, PA-C   0.1 mg at 08/29/16 2245  . cyclobenzaprine (FLEXERIL) tablet 10 mg  10 mg Oral TID PRN Lindell Spar I, NP      . gabapentin (NEURONTIN) capsule 300 mg  300 mg Oral TID PC & HS Nwoko, Agnes I, NP      . hydrOXYzine (ATARAX/VISTARIL) tablet 25 mg  25 mg Oral Q6H PRN Patriciaann Clan E, PA-C      . magnesium hydroxide (MILK OF MAGNESIA) suspension 30 mL  30 mL Oral Daily PRN Laverle Hobby, PA-C      . nicotine polacrilex (NICORETTE) gum 2 mg  2 mg Oral PRN Patriciaann Clan E, PA-C   2 mg at 08/30/16 1306  . QUEtiapine (SEROQUEL) tablet 25 mg  25 mg Oral TID PRN Lindell Spar I, NP      . QUEtiapine (SEROQUEL) tablet 50 mg  50 mg Oral QHS Nwoko, Agnes I, NP         PTA Medications: Prescriptions Prior to Admission  Medication Sig Dispense Refill Last Dose  . buPROPion (WELLBUTRIN SR) 100 MG 12 hr tablet Take 1 tablet (100 mg total) by mouth 2 (two) times daily. 60 tablet 0 Past Month at Unknown time  . busPIRone (BUSPAR) 10 MG tablet Take 30 mg by mouth 3 (three) times daily.    Past Month at Unknown time  . carbamazepine (TEGRETOL) 200 MG tablet Take 200 mg by mouth 3 (three) times daily.   Past Month at Unknown time  . cloNIDine (CATAPRES) 0.1 MG tablet Take 1 tablet (0.1 mg total) by mouth 2 (two) times daily. 60 tablet 0 Past Month at Unknown time  . cyclobenzaprine (FLEXERIL) 10 MG tablet Take 10 mg by mouth 3 (three) times daily as needed for muscle spasms.   Past Month at Unknown time  . gabapentin (NEURONTIN) 300 MG capsule Take 1,200 mg by mouth 2 (two) times daily.   Past Month at Unknown time  . QUEtiapine (SEROQUEL) 100 MG tablet Take 100 mg by mouth at bedtime.   Past Month at Unknown time    Treatment Modalities: Medication Management, Group therapy, Case management,  1 to 1 session with clinician, Psychoeducation, Recreational therapy.  Patient Stressors: Financial difficulties Medication change or noncompliance Substance abuse Other: homeless  Patient Strengths: Active sense of humor Communication skills General fund of knowledge Motivation for treatment/growth  Physician Treatment Plan for Primary Diagnosis: MDD (major depressive disorder), recurrent severe, without psychosis ([place]) Long Term Goal(s): Improvement in symptoms so as ready for discharge  Short Term Goals: Ability to identify changes in lifestyle to reduce recurrence of condition will improve Ability to verbalize feelings will improve Ability to disclose and discuss suicidal ideas Ability to demonstrate self-control will improve Ability to identify and develop effective coping behaviors will improve Compliance with prescribed medications will improve Ability  to identify triggers associated with substance abuse/mental health issues will improve  Medication Management: Evaluate patient's response, side effects, and tolerance of medication regimen.  Therapeutic Interventions: 1 to 1 sessions, Unit Group sessions and Medication administration.  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[place])   Chronic pain syndrome   Long Term Goal(s): Improvement in symptoms so as ready for discharge  Short Term Goals: Ability to identify changes in lifestyle to reduce recurrence of condition will improve Ability to verbalize feelings will improve Ability to disclose and discuss suicidal ideas Ability to demonstrate self-control will improve Ability to identify and develop effective coping behaviors will improve Compliance with prescribed medications will improve Ability to identify triggers associated with substance abuse/mental health issues will improve  Medication Management: Evaluate patient's response, side effects, and tolerance of medication regimen.  Therapeutic Interventions: 1 to 1 sessions, Unit Group sessions and Medication administration.  Evaluation of Outcomes: Not Met   RN Treatment Plan for Primary Diagnosis: MDD (major depressive disorder), recurrent severe, without psychosis ([place]) Long Term Goal(s): Knowledge of disease and therapeutic regimen to maintain health will improve  Short Term Goals: Ability to verbalize feelings will improve, Ability to disclose and discuss suicidal ideas and Ability to identify and develop effective coping behaviors will improve  Medication Management: RN will administer medications as ordered by provider, will assess and  evaluate patient's response and provide education to patient for prescribed medication. RN will report any adverse and/or side effects to prescribing provider.  Therapeutic Interventions: 1 on 1 counseling sessions, Psychoeducation, Medication administration, Evaluate responses to treatment, Monitor vital signs and CBGs as ordered, Perform/monitor CIWA, COWS, AIMS and Fall Risk screenings as ordered, Perform wound care treatments as ordered.  Evaluation of Outcomes: Not Met   LCSW Treatment Plan for Primary Diagnosis: MDD (major depressive disorder), recurrent severe, without psychosis ([place]) Long Term Goal(s): Safe transition to appropriate next level of care at discharge, Engage patient in therapeutic group addressing interpersonal concerns.  Short Term Goals: Engage patient in aftercare planning with referrals and resources, Identify triggers associated with mental health/substance abuse issues and Increase skills for wellness and recovery  Therapeutic Interventions: Assess for all discharge needs, 1 to 1 time with Social worker, Explore available resources and support systems, Assess for adequacy in community support network, Educate family and significant other(s) on suicide prevention, Complete Psychosocial Assessment, Interpersonal group therapy.  Evaluation of Outcomes: Not Met   Progress in Treatment: Attending groups: Pt is new to milieu, continuing to assess  Participating in groups: Pt is new to milieu, continuing to assess  Taking medication as prescribed: Yes, MD continues to assess  for medication changes as needed Toleration medication: Yes, no side effects reported at this time Family/Significant other contact made: No, CSW assessing for appropriate contact Patient understands diagnosis: Continuing to assess Discussing patient identified problems/goals with staff: Yes Medical problems stabilized or resolved: Yes Denies suicidal/homicidal ideation: Yes Issues/concerns per  patient self-inventory: None Other: N/A  New problem(s) identified: None identified at this time.   New Short Term/Long Term Goal(s): None identified at this time.   Discharge Plan or Barriers: CSW will assess for appropriate discharge plan and relevant barriers.   Reason for Continuation of Hospitalization: Anxiety Depression Medication stabilization Withdrawal symptoms  Estimated Length of Stay: 3-5 days  Attendees: Patient: 08/30/2016  1:49 PM  Physician: Dr. Shea Evans 08/30/2016  1:49 PM  Nursing: Minta Balsam, RN 08/30/2016  1:49 PM  RN Care Manager: Lars Pinks, RN 08/30/2016  1:49 PM  Social Worker: Adriana Reams, LCSW; Matthew Saras, [place] 08/30/2016  1:49 PM  Recreational Therapist:  08/30/2016  1:49 PM  Other: Lindell Spar, NP; Samuel Jester, NP 08/30/2016  1:49 PM  Other:  08/30/2016  1:49 PM  Other: 08/30/2016  1:49 PM    Scribe for Treatment Team: Gladstone Lighter, LCSW 08/30/2016 1:49 PM

## 2016-08-30 NOTE — H&P (Signed)
Psychiatric Admission Assessment Adult  Patient Identification: Theodore Carr  MRN:  [id number]  Date of Evaluation:  08/30/2016  Chief Complaint:  Worsening symptoms of depression & drug use.  Principal Diagnosis: Cocaine use disorder, severe dependence.  Diagnosis:   Patient Active Problem List   Diagnosis Date Noted  . Cocaine use disorder, severe, dependence (HCC) [F14.20] 06/14/2016    Priority: High  . Cocaine abuse with cocaine-induced mood disorder (HCC) [F14.14] 07/16/2015    Priority: Medium  . Substance induced mood disorder (HCC) [F19.94] 08/29/2016  . Intermittent explosive disorder [F63.81] 06/14/2016  . Polysubstance dependence including opioid drug with daily use [facility]) [F19.20] 07/16/2015   History of Present Illness: This is an admission assessment for Theodore Carr, a 31 year old Caucasian male with hx of Cocaine use disorder & substance induced mood disorder. He is known in this hospital from previous admissions. Admitted to the [facility] adult unit under involuntary commitment from the [facility] with complaints of worsening symptoms of depression &  drug use (cocaine). He also reports that he has been off of his medications since 08-09-16. During this admission, Siddh reports, "I'm here because I want to get back on my medicines. I have not been on my medicine since 08-09-16. I have been walking the streets, smoking/snorting crack cocaine. I could not get the mental health [facility] give me my mental health medicines. They told me that I have to give them 2 hour notice prior to coming to get my medicines. I'm so much in pain & aggravated. I need to get back on my medicines so that I can go out there & find me a place to live. I'm not suicidal, homicidal, no voices & I'm not seeing things".  Associated Signs/Symptoms:  Depression Symptoms:  depressed mood, psychomotor agitation, anxiety,  (Hypo) Manic Symptoms:  Impulsivity, Irritable Mood, Labiality of  Mood,  Anxiety Symptoms:  Excessive Worry,  Psychotic Symptoms:  Denies any hallucinations, delusional thoughts or paranoia.  PTSD Symptoms: Denies any PTSD symptoms or events.  Total Time spent with patient: 1 hour  Past Psychiatric History: Cocaine use disorder, Substance induced mood disorder.  Is the patient at risk to self? No.  Has the patient been a risk to self in the past 6 months? No.  Has the patient been a risk to self within the distant past? Yes.    Is the patient a risk to others? No.  Has the patient been a risk to others in the past 6 months? No.  Has the patient been a risk to others within the distant past? No.   Prior Inpatient Therapy: Prior Inpatient Therapy: Yes  Prior Outpatient Therapy: Prior Outpatient Therapy: Yes Prior Therapy Dates: UTA Prior Therapy Facilty/Provider(s): UTA Reason for Treatment: UTA Does patient have an ACCT team?: No Does patient have Intensive In-House Services?  : No Does patient have [facility] services? : No Does patient have P4CC services?: No  Alcohol Screening: 1. How often do you have a drink containing alcohol?: Never 9. Have you or someone else been injured as a result of your drinking?: No 10. Has a relative or friend or a doctor or another health worker been concerned about your drinking or suggested you cut down?: No Alcohol Use Disorder Identification Test Final Score (AUDIT): 0 Brief Intervention: AUDIT score less than 7 or less-screening does not suggest unhealthy drinking-brief intervention not indicated  Substance Abuse History in the last 12 months:  Yes.    Consequences of Substance  Abuse: Medical Consequences:  Liver damage, Possible death by overdose Legal Consequences:  Arrests, jail time, Loss of driving privilege. Family Consequences:  Family discord, divorce and or separation.  Previous Psychotropic Medications: Yes (Seroquel, Ativan, Wellbutrin)    Psychological Evaluations: No   Past Medical  History:  Past Medical History:  Diagnosis Date  . Compression fracture of T12 vertebra (HCC) 2008  . Depression   . Hypertension    History reviewed. No pertinent surgical history.  Family History: History reviewed. No pertinent family history.  Family Psychiatric  History: Depression: Mother, Anxiety: Sister.  Tobacco Screening: Have you used any form of tobacco in the last 30 days? (Cigarettes, Smokeless Tobacco, Cigars, and/or Pipes): Yes Tobacco use, Select all that apply: 5 or more cigarettes per day Are you interested in Tobacco Cessation Medications?: Yes, will notify MD for an order Counseled patient on smoking cessation including recognizing danger situations, developing coping skills and basic information about quitting provided: Refused/Declined practical counseling Social History:  History  Alcohol Use No     History  Drug Use No    Comment: not in 2 years    Additional Social History: Marital status: Single    Prescriptions: Ativan 1mg , Seroquel 100mg , Restoril 15 mg History of alcohol / drug use?: Yes (Pt reports tboacco use in the last 30 days, substance abuse disorder (Benzodiazeenes, Crack Cocaine, THC, Opiates) Longest period of sobriety (when/how long): UTA  Allergies:   Allergies  Allergen Reactions  . Amoxicillin Other (See Comments)    Reaction:  Unknown; childhood reaction Has patient had a PCN reaction causing immediate rash, facial/tongue/throat swelling, SOB or lightheadedness with hypotension: Unsure Has patient had a PCN reaction causing severe rash involving mucus membranes or skin necrosis: Unsure Has patient had a PCN reaction that required hospitalization Unsure Has patient had a PCN reaction occurring within the last 10 years: No If all of the above answers are "NO", then may proceed with Cephalosporin use.   . Haloperidol And Related Other (See Comments)    Pt states that this medication locks his body up.     Lab Results: No results  found for this or any previous visit (from the past 48 hour(s)).  Blood Alcohol level:  Lab Results  Component Value Date   ETH <5 08/18/2016   ETH <5 06/13/2016   Metabolic Disorder Labs:  No results found for: HGBA1C, MPG No results found for: PROLACTIN No results found for: CHOL, TRIG, HDL, CHOLHDL, VLDL, LDLCALC  Current Medications: Current Facility-Administered Medications  Medication Dose Route Frequency Provider Last Rate Last Dose  . acetaminophen (TYLENOL) tablet 650 mg  650 mg Oral Q6H PRN Kerry HoughSimon, Spencer E, PA-C      . alum & mag hydroxide-simeth (MAALOX/MYLANTA) 200-200-20 MG/5ML suspension 30 mL  30 mL Oral Q4H PRN Kerry HoughSimon, Spencer E, PA-C      . buPROPion [facility](WELLBUTRIN SR) 12 hr tablet 100 mg  100 mg Oral BID Donell SievertSimon, Spencer E, PA-C   100 mg at 08/30/16 0801  . busPIRone (BUSPAR) tablet 15 mg  15 mg Oral TID Donell SievertSimon, Spencer E, PA-C   15 mg at 08/30/16 0801  . carbamazepine (TEGRETOL) tablet 200 mg  200 mg Oral BID Donell SievertSimon, Spencer E, PA-C      . cloNIDine (CATAPRES) tablet 0.1 mg  0.1 mg Oral QHS Donell SievertSimon, Spencer E, PA-C   0.1 mg at 08/29/16 2245  . gabapentin (NEURONTIN) capsule 300 mg  300 mg Oral TID Kerry HoughSimon, Spencer E, PA-C   300  mg at 08/30/16 0801  . hydrOXYzine (ATARAX/VISTARIL) tablet 25 mg  25 mg Oral Q6H PRN Donell Sievert E, PA-C      . magnesium hydroxide (MILK OF MAGNESIA) suspension 30 mL  30 mL Oral Daily PRN Kerry Hough, PA-C      . nicotine polacrilex (NICORETTE) gum 2 mg  2 mg Oral PRN Kerry Hough, PA-C      . OLANZapine zydis (ZYPREXA) disintegrating tablet 5 mg  5 mg Oral Q8H PRN Donell Sievert E, PA-C   5 mg at 08/30/16 0801   And  . ziprasidone (GEODON) injection 20 mg  20 mg Intramuscular PRN Kerry Hough, PA-C      . QUEtiapine (SEROQUEL) tablet 50 mg  50 mg Oral QHS,MR X 1 Kerry Hough, PA-C   50 mg at 08/29/16 2245   PTA Medications: Prescriptions Prior to Admission  Medication Sig Dispense Refill Last Dose  . buPROPion (WELLBUTRIN SR)  100 MG 12 hr tablet Take 1 tablet (100 mg total) by mouth 2 (two) times daily. 60 tablet 0 Past Month at Unknown time  . busPIRone (BUSPAR) 10 MG tablet Take 30 mg by mouth 3 (three) times daily.    Past Month at Unknown time  . carbamazepine (TEGRETOL) 200 MG tablet Take 200 mg by mouth 3 (three) times daily.   Past Month at Unknown time  . cloNIDine (CATAPRES) 0.1 MG tablet Take 1 tablet (0.1 mg total) by mouth 2 (two) times daily. 60 tablet 0 Past Month at Unknown time  . cyclobenzaprine (FLEXERIL) 10 MG tablet Take 10 mg by mouth 3 (three) times daily as needed for muscle spasms.   Past Month at Unknown time  . gabapentin (NEURONTIN) 300 MG capsule Take 1,200 mg by mouth 2 (two) times daily.   Past Month at Unknown time  . QUEtiapine (SEROQUEL) 100 MG tablet Take 100 mg by mouth at bedtime.   Past Month at Unknown time   Musculoskeletal: Strength & Muscle Tone: within normal limits Gait & Station: normal Patient leans: N/A  Psychiatric Specialty Exam: Physical Exam  Constitutional: He is oriented to person, place, and time. He appears well-developed.  HENT:  Head: Normocephalic.  Eyes: Pupils are equal, round, and reactive to light.  Neck: Normal range of motion.  Respiratory: Effort normal.  Genitourinary:  Genitourinary Comments: Deferred  Musculoskeletal: Normal range of motion.  Neurological: He is alert and oriented to person, place, and time.  Skin: Skin is warm and dry.    Review of Systems  Constitutional: Negative.   Eyes: Negative.   Respiratory: Negative.   Cardiovascular: Negative.   Gastrointestinal: Negative.   Genitourinary: Negative.   Musculoskeletal: Negative.   Skin: Negative.   Neurological: Negative.   Endo/Heme/Allergies: Negative.   Psychiatric/Behavioral: Positive for depression and substance abuse (UDS positive for Cocaine). Negative for hallucinations, memory loss and suicidal ideas. The patient is nervous/anxious and has insomnia.     Blood  pressure (!) 130/94, pulse 75, temperature 97.5 F (36.4 C), temperature source Oral, resp. rate 16, height 5\' 7"  (1.702 m), weight 68.5 kg (151 lb).Body mass index is 23.65 kg/m.  General Appearance: Casual and Fairly Groomed  Eye Contact:  Fair  Speech:  Clear and Coherent and Normal Rate  Volume:  Normal  Mood:  Irritable and agitated  Affect:  Constricted  Thought Process:  Coherent and Goal Directed  Orientation:  Full (Time, Place, and Person)  Thought Content:  Rumination, currently denies any hallucinations, delusional thoughts  or paranoia.  Suicidal Thoughts:  Denies any thoughts, plans or intent.  Homicidal Thoughts:  Denies any thoughts, plans or intent.  Memory:  Immediate;   Good Recent;   Good Remote;   Good  Judgement:  Fair  Insight:  Shallow  Psychomotor Activity:  Irritated  Concentration:  Concentration: Poor and Attention Span: Poor  Recall:  Good  Fund of Knowledge:  Good  Language:  Good  Akathisia:  Negative  Handed:  Right  AIMS (if indicated):     Assets:  Communication Skills Desire for Improvement Social Support  ADL's:  Intact  Cognition:  WNL  Sleep:  Number of Hours: 6.25   Treatment Plan/Recommendations: 1. Admit for crisis management and stabilization, estimated length of stay 3-5 days.   2. Medication management to reduce current symptoms to base line and improve the patient's overall level of functioning: See [facility] for medication lists. Tegretol 200 mg for mood stabilization, Seroquel 50 mg for mood control, Wellbutrin XL 150 mg for depression, Seroquel 25 mg tid prn for agitation, Gabapentin 300 mg for agitation, Buspar 15 mg for anxiety.  Labs: Will obtain: HGBA1c, Prolactin level, TSH, Lipid panel & EKG.  3. Treat health problems as indicated: Resumes Flexeril 10 mg tid prn for muscle pain.   4. Develop treatment plan to decrease risk of relapse upon discharge and the need for readmission.  5. Psycho-social education regarding relapse  prevention and self care.  6. Health care follow up as needed for medical problems.  7. Review, reconcile, and reinstate any pertinent home medications for other health issues where appropriate. 8. Call for consults with hospitalist for any additional specialty patient care services as needed.  Observation Level/Precautions:  15 minute checks  Laboratory:  Per ED, UDS positive for Cocaine  Psychotherapy: Group sessions   Medications: See [facility]   Consultations: As needed   Discharge Concerns: Mood stability, maintaining sobriety.  Estimated LOS: 2-4 days  Other: Admit to [address].     Physician Treatment Plan for Primary Diagnosis: Will initiate medication management for mood stability. Set up an outpatient psychiatric services for medication management. Will encourage medication adherence with psychiatric medications.  Long Term Goal(s): Improvement in symptoms so as ready for discharge  Short Term Goals: Ability to identify changes in lifestyle to reduce recurrence of condition will improve, Ability to verbalize feelings will improve, Ability to disclose and discuss suicidal ideas and Ability to demonstrate self-control will improve  Physician Treatment Plan for Secondary Diagnosis: Active Problems:   Substance induced mood disorder (HCC)  Long Term Goal(s): Improvement in symptoms so as ready for discharge  Short Term Goals: Ability to identify and develop effective coping behaviors will improve, Compliance with prescribed medications will improve and Ability to identify triggers associated with substance abuse/mental health issues will improve  I certify that inpatient services furnished can reasonably be expected to improve the patient's condition.    Sanjuana Kava, NP, PMHNP, FNP-BC 5/15/201810:27 AM

## 2016-08-30 NOTE — [facility] Suicide Risk Assessment (Signed)
[facility]   Nursing information obtained from:    Demographic factors:    Current Mental Status:    Loss Factors:    Historical Factors:    Risk Reduction Factors:     Total Time spent with patient: 30 minutes Principal Problem: MDD (major depressive disorder), recurrent severe, without psychosis (HCC) Diagnosis:   Patient Active Problem List   Diagnosis Date Noted  . MDD (major depressive disorder), recurrent severe, without psychosis (HCC) [F33.2] 08/30/2016  . 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 Chronic pain syndrome [G89.4] 08/30/2016  . Substance induced mood disorder (HCC) [F19.94] 08/29/2016  . Intermittent explosive disorder [F63.81] 06/14/2016  . Cocaine use disorder, severe, dependence (HCC) [F14.20] 06/14/2016  . Polysubstance dependence including opioid drug with daily use (HCC) [F19.20] 07/16/2015  . Cocaine abuse with cocaine-induced mood disorder [facility](HCC) [F14.14] 07/16/2015   Subjective Data: Patient reports he is depressed, , anxious , was abusing cocaine, had SI , wants to get better, wants to be restarted on his medications and wants to go to a substance abuse treatment program.  Continued Clinical Symptoms:  Alcohol Use Disorder Identification Test Final Score (AUDIT): 0 The "Alcohol Use Disorders Identification Test", Guidelines for Use in Primary Care, Second Edition.  World Science writerHealth Organization [facility](WHO). Score between 0-7:  no or low risk or alcohol related problems. Score between 8-15:  moderate risk of alcohol related problems. Score between 16-19:  high risk of alcohol related problems. Score 20 or above:  warrants further diagnostic evaluation for alcohol dependence and treatment.   CLINICAL FACTORS:   Alcohol/Substance Abuse/Dependencies Chronic Pain Unstable or Poor Therapeutic Relationship Previous Psychiatric Diagnoses and Treatments   Musculoskeletal: Strength & Muscle Tone: within normal  limits Gait & Station: SEEN IN BED Patient leans: N/A  Psychiatric Specialty Exam: Physical Exam  Review of Systems  Psychiatric/Behavioral: Positive for depression, substance abuse and suicidal ideas. The patient is nervous/anxious and has insomnia.   All other systems reviewed and are negative.   Blood pressure (!) 130/94, pulse 75, temperature 97.5 F (36.4 C), temperature source Oral, resp. rate 16, height 5\' 7"  (1.702 m), weight 68.5 kg (151 lb).Body mass index is 23.65 kg/m.  General Appearance: Guarded  Eye Contact:  Fair  Speech:  Normal Rate  Volume:  Decreased  Mood:  Anxious and Dysphoric  Affect:  Constricted  Thought Process:  Goal Directed and Descriptions of Associations: Circumstantial  Orientation:  Other:  self, situation  Thought Content:  Rumination  Suicidal Thoughts:  Yes.  without intent/plan  Homicidal Thoughts:  No  Memory:  Immediate;   Fair Recent;   Fair Remote;   Fair  Judgement:  Impaired  Insight:  Shallow  Psychomotor Activity:  Decreased  Concentration:  Concentration: Poor and Attention Span: Poor  Recall:  [employer]  Fund of Knowledge:  Fair  Language:  Fair  Akathisia:  No  Handed:  Right  AIMS (if indicated):     Assets:  Desire for Improvement  ADL's:  Intact  Cognition:  WNL  Sleep:  Number of Hours: 6.25      COGNITIVE FEATURES THAT CONTRIBUTE TO RISK:  Closed-mindedness, Polarized thinking and Thought constriction (tunnel vision)    SUICIDE RISK:   Moderate:  Frequent suicidal ideation with limited intensity, and duration, some specificity in terms of plans, no associated intent, good self-control, limited dysphoria/symptomatology, some risk factors present, and identifiable protective factors, including available and accessible social support.  PLAN OF CARE: Case discussed with  Aggie.N NP , please H&P for plan.  I certify that inpatient services furnished can reasonably be expected to improve the patient's condition.    Jajaira Ruis, MD 08/30/2016, 1:38 PM

## 2016-08-30 NOTE — Progress Notes (Signed)
Recreation Therapy Notes  Animal-Assisted Activity (AAA) Program Checklist/Progress Notes Patient Eligibility Criteria Checklist & Daily Group note for Rec TxIntervention  Date: 05.15.2018 Time: 2:45pm Location: [address] Dayroom    AAA/T Program Assumption of Risk Form signed by Patient/ or Parent Legal Guardian Yes  Patient is free of allergies or sever asthma Yes  Patient reports no fear of animals Yes  Patient reports no history of cruelty to animals Yes  Patient understands his/her participation is voluntary Yes  Behavioral Response: Did not attend.   Xavier Munger L Demarian Epps, LRT/CTRS        Annalaya Wile L 08/30/2016 2:55 PM 

## 2016-08-30 NOTE — [facility] Group Notes (Signed)
[facility] 08/30/2016 1:15pm  Type of Therapy: Mental Health Association Presentation  Pt did not attend, declined invitation.   Vernie ShanksLauren Wadie Liew, LCSW 08/30/2016 2:40 PM

## 2016-08-30 NOTE — [facility] Suicide Risk Assessment (Signed)
[facility] INPATIENT:  Family/Significant Other Suicide Prevention Education  Suicide Prevention Education:  Patient Refusal for Family/Significant Other Suicide Prevention Education: The patient Theodore BalesSteven Carr has refused to provide written consent for family/significant other to be provided Family/Significant Other Suicide Prevention Education during admission and/or prior to discharge.  Physician notified.  Theodore LennertLauren C Lincoln Carr 08/30/2016, 2:38 PM

## 2016-08-31 LAB — LIPID PANEL
CHOL/HDL RATIO: 3.3 ratio
Cholesterol: 172 mg/dL (ref 0–200)
HDL: 52 mg/dL (ref >40)
LDL CALC: 76 mg/dL (ref 0–99)
Triglycerides: 218 mg/dL — ABNORMAL HIGH (ref <150)
VLDL: 44 mg/dL — AB (ref 0–40)

## 2016-08-31 MED ORDER — HYDROCORTISONE 0.5 % EX CREA
TOPICAL_CREAM | Freq: Three times a day (TID) | CUTANEOUS | Status: DC | PRN
  Administered 2016-08-31 – 2016-09-01 (×4): via TOPICAL
  Filled 2016-08-31 (×2): qty 28.35

## 2016-08-31 NOTE — Plan of Care (Signed)
Problem: Education: Goal: Verbalization of understanding the information provided will improve Outcome: Progressing Patient verbalizes understanding of information, education provided.  Problem: Safety: Goal: Periods of time without injury will increase Outcome: Progressing Patient has not engaged in self harm, denies SI.   

## 2016-08-31 NOTE — Progress Notes (Addendum)
D: Patient up and visible in the milieu. Spoke with patient 1:1. Rating depression, hopelessness and anxiety all at a 5/10. Rates sleep as good, appetite as good, energy as normal and concentration as poor. Patient's affect flat, irritable with depressed and labile mood. States goal for today is "don't get high, don't use narcotics." Complaining of back pain, spasms of an 8/10, nicotine cravings. Complaining of anxiety.   A: Medicated per orders, flexeril, vistaril and nicorette prn given. Emotional support offered and self inventory reviewed. Encouraged completion of Suicide Safety Plan. Discussed POC with MD, SW.  Fall prevention plan in place.   R: Patient verbalizes understanding of POC, falls teaching however refuses to wear yellow socks. On reassess, patient's pain is a 4-5/10, gum effective and vistaril provided some relief. Patient anxious to speak with CM regarding housing options post discharge. Patient denies SI/HI and remains safe on level III obs.  Add note: Pt continues to refuse EKG and Eappen, MD made aware.

## 2016-08-31 NOTE — Progress Notes (Signed)
[facility]  08/31/2016 3:38 PM Theodore Carr  MRN:  [id number] Subjective:  Pt states " I am better, I just want to get to a substance abuse treatment program . I am getting used to the medications.'  Objective:Patient seen and chart reviewed.Discussed patient with treatment team.  Pt today seen as less irritable , withdrawn mostly, reports he is feeling better than yesterday. Denies ADRs to medications. Continues to be motivated to go to a substance abuse treatment program, CSW is working on the same - [facility]. Per RN - continues to need support.    Principal Problem: MDD (major depressive disorder), recurrent severe, without psychosis (HCC) Diagnosis:   Patient Active Problem List   Diagnosis Date Noted  . MDD (major depressive disorder), recurrent severe, without psychosis (HCC) [F33.2] 08/30/2016  . 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 Chronic pain syndrome [G89.4] 08/30/2016  . Substance induced mood disorder (HCC) [F19.94] 08/29/2016  . Intermittent explosive disorder [F63.81] 06/14/2016  . Cocaine use disorder, severe, dependence (HCC) [F14.20] 06/14/2016  . Polysubstance dependence including opioid drug with daily use (HCC) [F19.20] 07/16/2015  . Cocaine abuse with cocaine-induced mood disorder [facility](HCC) [F14.14] 07/16/2015   Total Time spent with patient: 20 minutes  Past Psychiatric History: Please see H&P.   Past Medical History:  Past Medical History:  Diagnosis Date  . Compression fracture of T12 vertebra (HCC) 2008  . Depression   . Hypertension    History reviewed. No pertinent surgical history. Family History:  Family History  Problem Relation Age of Onset  . Drug abuse Cousin    Family Psychiatric  History: Please see H&P.  Social History: Please see H&P.  History  Alcohol Use No     History  Drug Use No    Comment: not in 2 years    Social History   Social History  . Marital status:  Single    Spouse name: N/A  . Number of children: N/A  . Years of education: N/A   Social History Main Topics  . Smoking status: Current Every Day Smoker    Packs/day: 0.50    Types: Cigarettes  . Smokeless tobacco: Never Used  . Alcohol use No  . Drug use: No     Comment: not in 2 years  . Sexual activity: Yes   Other Topics Concern  . None   Social History Narrative  . None   Additional Social History:    Prescriptions: Ativan 1mg , Seroquel 100mg , Restoril 15 mg History of alcohol / drug use?: Yes (Pt reports tboacco use in the last 30 days, substance abuse disorder (Benzodiazeenes, Crack Cocaine, THC, Opiates) Longest period of sobriety (when/how long): UTA                    Sleep: Fair  Appetite:  Fair  Current Medications: Current Facility-Administered Medications  Medication Dose Route Frequency Provider Last Rate Last Dose  . acetaminophen (TYLENOL) tablet 650 mg  650 mg Oral Q6H PRN Kerry HoughSimon, Spencer E, PA-C      . alum & mag hydroxide-simeth (MAALOX/MYLANTA) 200-200-20 MG/5ML suspension 30 mL  30 mL Oral Q4H PRN Donell SievertSimon, Spencer E, PA-C      . buPROPion (WELLBUTRIN XL) 24 hr tablet 150 mg  150 mg Oral Daily Armandina StammerNwoko, Agnes I, NP   150 mg at 08/31/16 0825  . busPIRone (BUSPAR) tablet 15 mg  15 mg Oral TID Donell SievertSimon, Spencer E, PA-C   15 mg at  08/31/16 1159  . carbamazepine (TEGRETOL) tablet 200 mg  200 mg Oral TID Armandina Stammer I, NP   200 mg at 08/31/16 1159  . cloNIDine (CATAPRES) tablet 0.1 mg  0.1 mg Oral QHS Donell Sievert E, PA-C   0.1 mg at 08/29/16 2245  . cyclobenzaprine (FLEXERIL) tablet 10 mg  10 mg Oral TID PRN Armandina Stammer I, NP   10 mg at 08/31/16 1159  . gabapentin (NEURONTIN) capsule 300 mg  300 mg Oral TID PC & HS Nwoko, Agnes I, NP   300 mg at 08/31/16 1158  . hydrocortisone cream 0.5 %   Topical TID PRN Armandina Stammer I, NP      . hydrOXYzine (ATARAX/VISTARIL) tablet 25 mg  25 mg Oral Q6H PRN Donell Sievert E, PA-C   25 mg at 08/31/16 1159  .  magnesium hydroxide (MILK OF MAGNESIA) suspension 30 mL  30 mL Oral Daily PRN Kerry Hough, PA-C      . nicotine polacrilex (NICORETTE) gum 2 mg  2 mg Oral PRN Donell Sievert E, PA-C   2 mg at 08/31/16 1434  . QUEtiapine (SEROQUEL) tablet 25 mg  25 mg Oral TID PRN Armandina Stammer I, NP      . QUEtiapine (SEROQUEL) tablet 50 mg  50 mg Oral QHS Armandina Stammer I, NP   50 mg at 08/30/16 2125  . tuberculin injection 5 Units  5 Units Intradermal Once Jomarie Longs, MD   5 Units at 08/30/16 1831    Lab Results:  Results for orders placed or performed during the hospital encounter of 08/29/16 (from the past 48 hour(s))  TSH     Status: None   Collection Time: 08/30/16  6:24 PM  Result Value Ref Range   TSH 1.505 0.350 - 4.500 uIU/mL    Comment: Performed by a 3rd Generation assay with a functional sensitivity of <=0.01 uIU/mL. Performed at [facility], [address], [place], [place] [address]   Lipid panel     Status: Abnormal   Collection Time: 08/31/16  6:39 AM  Result Value Ref Range   Cholesterol 172 0 - 200 mg/dL   Triglycerides 604 (H) <150 mg/dL   HDL 52 >54 mg/dL   Total CHOL/HDL Ratio 3.3 RATIO   VLDL 44 (H) 0 - 40 mg/dL   LDL Cholesterol 76 0 - 99 mg/dL    Comment:        Total Cholesterol/HDL:CHD Risk Coronary Heart Disease Risk Table                     Men   Women  1/2 Average Risk   3.4   3.3  Average Risk       5.0   4.4  2 X Average Risk   9.6   7.1  3 X Average Risk  23.4   11.0        Use the calculated Patient Ratio above and the CHD Risk Table to determine the patient's CHD Risk.        ATP III CLASSIFICATION (LDL):  <100     mg/dL   Optimal  098-119  mg/dL   Near or Above                    Optimal  130-159  mg/dL   Borderline  147-829  mg/dL   High  >562     mg/dL   Very High Performed at [facility], [address] [name], [place] [address]     Blood Alcohol level:  Lab Results  Component Value Date   ETH <5 08/18/2016    ETH <5 06/13/2016    Metabolic Disorder Labs: No results found for: HGBA1C, MPG No results found for: PROLACTIN Lab Results  Component Value Date   CHOL 172 08/31/2016   TRIG 218 (H) 08/31/2016   HDL 52 08/31/2016   CHOLHDL 3.3 08/31/2016   VLDL 44 (H) 08/31/2016   LDLCALC 76 08/31/2016    Physical Findings: AIMS: Facial and Oral Movements Muscles of Facial Expression: None, normal Lips and Perioral Area: None, normal Jaw: None, normal Tongue: None, normal,Extremity Movements Upper (arms, wrists, hands, fingers): None, normal Lower (legs, knees, ankles, toes): None, normal, Trunk Movements Neck, shoulders, hips: None, normal, Overall Severity Severity of abnormal movements (highest score from questions above): None, normal Incapacitation due to abnormal movements: None, normal Patient's awareness of abnormal movements (rate only patient's report): No Awareness, Dental Status Current problems with teeth and/or dentures?: No Does patient usually wear dentures?: No  CIWA:    COWS:     Musculoskeletal: Strength & Muscle Tone: within normal limits Gait & Station: normal Patient leans: N/A  Psychiatric Specialty Exam: Physical Exam  Nursing note and vitals reviewed.   Review of Systems  Psychiatric/Behavioral: Positive for depression and substance abuse. The patient is nervous/anxious.   All other systems reviewed and are negative.   Blood pressure 128/81, pulse (!) 105, temperature 97.5 F (36.4 C), temperature source Oral, resp. rate 16, height 5\' 7"  (1.702 m), weight 68.5 kg (151 lb).Body mass index is 23.65 kg/m.  General Appearance: Guarded  Eye Contact:  Fair  Speech:  Normal Rate  Volume:  Decreased  Mood:  Irritable  Affect:  Congruent  Thought Process:  Goal Directed and Descriptions of Associations: Intact  Orientation:  Full (Time, Place, and Person)  Thought Content:  Rumination  Suicidal Thoughts:  No  Homicidal Thoughts:  No  Memory:  Immediate;    Fair Recent;   Fair Remote;   Fair  Judgement:  Fair  Insight:  Shallow  Psychomotor Activity:  Normal  Concentration:  Concentration: Fair and Attention Span: Fair  Recall:  [employer] of Knowledge:  Fair  Language:  Fair  Akathisia:  No  Handed:  Right  AIMS (if indicated):     Assets:  Communication Skills Desire for Improvement  ADL's:  Intact  Cognition:  WNL  Sleep:  Number of Hours: 6.75     Treatment Plan Summary:Patient seen as making some progress, continues to be motivated to attend the substance abuse treatment program, will continue treatment and referral.  Daily contact with patient to assess and evaluate symptoms and progress in treatment, Medication management and Plan see below  Buspar 15 mg po tid for anxiety sx. Tegretol 300 mg po tid for mood lability. Tegretol level in 2 days . Neurontin 200 mg po tid for anxiety sx. Wellbutrin XL 150 mg po daily for affective sx. Seroquel 50 mg po qhs for mood sx. PPD given on 08/29/2016. Labs reviewed - lipid panel- slightly abnormal - diet management. CSW will continue to work on disposition.     Layten Aiken, MD 08/31/2016, 3:38 PM

## 2016-08-31 NOTE — Progress Notes (Signed)
Per Pt request, CSW faxed referral packet to [facility] to facilitate admission to their facility. Fax number provided by facility: [phone]  Vernie ShanksLauren Areil Ottey, LCSW Clinical Social Work [phone]

## 2016-08-31 NOTE — Progress Notes (Signed)
Adult Psychoeducational Group Note  Date:  08/31/2016 Time:  1:26 PM  Group Topic/Focus:  Goals Group:   The focus of this group is to help patients establish daily goals to achieve during treatment and discuss how the patient can incorporate goal setting into their daily lives to aide in recovery.  Participation Level:  Active  Participation Quality:  Appropriate and Attentive  Affect:  Appropriate  Cognitive:  Alert and Appropriate  Insight: Appropriate and Good  Engagement in Group:  Engaged  Modes of Intervention:  Discussion  Additional Comments:  Patient did participate in group this morning.  Patient states that he has not been taking his medication and has been doing cocaine.  Pt states that he wants to get off the drugs and focus on his sobriety.  Pt states that he is now back on his medication.  Andrw Mcguirt R Jadea Shiffer 08/31/2016, 1:26 PM

## 2016-08-31 NOTE — Progress Notes (Signed)
   D: Pt was irritable and demanding. Writer observed pt mumbling during and after med pass. Also appeared to be irritable during room checks. Writer did not give hs clonidine because pt refused to cooperate with blood pressure checks.  A:  Support and encouragement was offered. 15 min checks continued for safety.  R: Pt remains safe.

## 2016-08-31 NOTE — Progress Notes (Signed)
Recreation Therapy Notes  Date: 08/31/16 Time: 0930 Location: 300 [address] Dayroom  Group Topic: Stress Management  Goal Area(s) Addresses:  Patient will verbalize importance of using healthy stress management.  Patient will identify positive emotions associated with healthy stress management.   Behavioral Response: Engaged  Intervention: Stress Management  Activity :  Body Scan Meditation.  LRT introduced the stress management technique of meditation.  LRT played a meditation from the Calm app to allow patients the opportunity to scan and acknowledge the sensations and tension within the body.  Patients were to follow along as the meditation played to engage in the activity.  Education:  Stress Management, Discharge Planning.   Education Outcome: Acknowledges edcuation/In group clarification offered/Needs additional education  Clinical Observations/Feedback: Pt attended group.   Enyah Moman, LRT/CTRS         Aaisha Sliter A 08/31/2016 12:48 PM 

## 2016-08-31 NOTE — [facility] Group Notes (Signed)
[facility] LCSW Group Therapy 08/31/2016 1:15 PM  Type of Therapy: Group Therapy- Emotion Regulation  Participation Level: Minimal  Participation Quality:  Attentive  Affect: Flat  Cognitive: Alert and Oriented   Insight:  Unable to assess  Engagement in Therapy: Developing/Improving and Engaged   Modes of Intervention: Clarification, Confrontation, Discussion, Education, Exploration, Limit-setting, Orientation, Problem-solving, Rapport Building, Dance movement psychotherapisteality Testing, Socialization and Support  Summary of Progress/Problems: The topic for group today was emotional regulation. This group focused on both positive and negative emotion identification and allowed group members to process ways to identify feelings, regulate negative emotions, and find healthy ways to manage internal/external emotions. Group members were asked to reflect on a time when their reaction to an emotion led to a negative outcome and explored how alternative responses using emotion regulation would have benefited them. Group members were also asked to discuss a time when emotion regulation was utilized when a negative emotion was experienced. Pt did not participate in group discussion but remained attentive throughout.    Vernie ShanksLauren Shellie Goettl, LCSW 08/31/2016 4:18 PM

## 2016-09-01 DIAGNOSIS — F19980 Other psychoactive substance use, unspecified with psychoactive substance-induced anxiety disorder: Secondary | ICD-10-CM

## 2016-09-01 DIAGNOSIS — Z813 Family history of other psychoactive substance abuse and dependence: Secondary | ICD-10-CM

## 2016-09-01 DIAGNOSIS — F17210 Nicotine dependence, cigarettes, uncomplicated: Secondary | ICD-10-CM

## 2016-09-01 DIAGNOSIS — G894 Chronic pain syndrome: Secondary | ICD-10-CM

## 2016-09-01 LAB — HEMOGLOBIN A1C
Hgb A1c MFr Bld: 5.2 % (ref 4.8–5.6)
MEAN PLASMA GLUCOSE: 103 mg/dL

## 2016-09-01 LAB — PROLACTIN: Prolactin: 28.5 ng/mL — ABNORMAL HIGH (ref 4.0–15.2)

## 2016-09-01 MED ORDER — BUPROPION HCL ER (XL) 150 MG PO TB24: 150.0000 mg | ORAL_TABLET | Freq: Every day | ORAL | 0 refills | Status: DC

## 2016-09-01 MED ORDER — CLONIDINE HCL 0.1 MG PO TABS: 0.1000 mg | ORAL_TABLET | Freq: Every day | ORAL | 0 refills | Status: DC

## 2016-09-01 MED ORDER — NICOTINE POLACRILEX 2 MG MT GUM: 2.0000 mg | CHEWING_GUM | OROMUCOSAL | 0 refills | Status: DC | PRN

## 2016-09-01 MED ORDER — CYCLOBENZAPRINE HCL 10 MG PO TABS: 10.0000 mg | ORAL_TABLET | Freq: Three times a day (TID) | ORAL | 0 refills | Status: DC | PRN

## 2016-09-01 MED ORDER — BUSPIRONE HCL 15 MG PO TABS: 15.0000 mg | ORAL_TABLET | Freq: Three times a day (TID) | ORAL | 0 refills | Status: DC

## 2016-09-01 MED ORDER — CARBAMAZEPINE 200 MG PO TABS: 200.0000 mg | ORAL_TABLET | Freq: Three times a day (TID) | ORAL | 0 refills | Status: DC

## 2016-09-01 MED ORDER — GABAPENTIN 300 MG PO CAPS: 300.0000 mg | ORAL_CAPSULE | Freq: Three times a day (TID) | ORAL | 0 refills | Status: DC

## 2016-09-01 MED ORDER — HYDROCORTISONE 0.5 % EX CREA: TOPICAL_CREAM | Freq: Three times a day (TID) | CUTANEOUS | 0 refills | Status: DC | PRN

## 2016-09-01 MED ORDER — HYDROXYZINE HCL 25 MG PO TABS: 25.0000 mg | ORAL_TABLET | Freq: Four times a day (QID) | ORAL | 0 refills | Status: DC | PRN

## 2016-09-01 MED ORDER — QUETIAPINE FUMARATE 50 MG PO TABS: 50.0000 mg | ORAL_TABLET | Freq: Every day | ORAL | 0 refills | Status: DC

## 2016-09-01 NOTE — Tx Team (Signed)
Interdisciplinary Treatment and Diagnostic Plan Update  09/01/2016 Time of Session: 9:30am Theodore Carr MRN: [id number]  Principal Diagnosis: MDD (major depressive disorder), recurrent severe, without psychosis (HCC)  Secondary Diagnoses: Principal Problem:   MDD (major depressive disorder), recurrent severe, without psychosis (HCC) Active Problems:   Cocaine use disorder, severe, dependence (HCC)   Substance induced mood disorder (HCC)   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HCC)   Chronic pain syndrome   Current Medications:  Current Facility-Administered Medications  Medication Dose Route Frequency Provider Last Rate Last Dose  . acetaminophen (TYLENOL) tablet 650 mg  650 mg Oral Q6H PRN Kerry Hough, PA-C      . alum & mag hydroxide-simeth (MAALOX/MYLANTA) 200-200-20 MG/5ML suspension 30 mL  30 mL Oral Q4H PRN Donell Sievert E, PA-C      . buPROPion (WELLBUTRIN XL) 24 hr tablet 150 mg  150 mg Oral Daily Armandina Stammer I, NP   150 mg at 09/01/16 0750  . busPIRone (BUSPAR) tablet 15 mg  15 mg Oral TID Donell Sievert E, PA-C   15 mg at 09/01/16 0750  . carbamazepine (TEGRETOL) tablet 200 mg  200 mg Oral TID Armandina Stammer I, NP   200 mg at 09/01/16 0749  . cloNIDine (CATAPRES) tablet 0.1 mg  0.1 mg Oral QHS Kerry Hough, PA-C   0.1 mg at 08/31/16 2114  . cyclobenzaprine (FLEXERIL) tablet 10 mg  10 mg Oral TID PRN Armandina Stammer I, NP   10 mg at 09/01/16 0931  . gabapentin (NEURONTIN) capsule 300 mg  300 mg Oral TID PC & HS Nwoko, Agnes I, NP   300 mg at 09/01/16 0749  . hydrocortisone cream 0.5 %   Topical TID PRN Armandina Stammer I, NP      . hydrOXYzine (ATARAX/VISTARIL) tablet 25 mg  25 mg Oral Q6H PRN Donell Sievert E, PA-C   25 mg at 08/31/16 1159  . magnesium hydroxide (MILK OF MAGNESIA) suspension 30 mL  30 mL Oral Daily PRN Kerry Hough, PA-C      . nicotine polacrilex (NICORETTE) gum 2 mg  2 mg Oral PRN Donell Sievert E, PA-C   2 mg at 09/01/16 0931  . QUEtiapine (SEROQUEL)  tablet 25 mg  25 mg Oral TID PRN Armandina Stammer I, NP      . QUEtiapine (SEROQUEL) tablet 50 mg  50 mg Oral QHS Armandina Stammer I, NP   50 mg at 08/31/16 2114  . tuberculin injection 5 Units  5 Units Intradermal Once Jomarie Longs, MD   5 Units at 08/30/16 1831    PTA Medications: Prescriptions Prior to Admission  Medication Sig Dispense Refill Last Dose  . buPROPion (WELLBUTRIN SR) 100 MG 12 hr tablet Take 1 tablet (100 mg total) by mouth 2 (two) times daily. 60 tablet 0 Past Month at Unknown time  . busPIRone (BUSPAR) 10 MG tablet Take 30 mg by mouth 3 (three) times daily.    Past Month at Unknown time  . carbamazepine (TEGRETOL) 200 MG tablet Take 200 mg by mouth 3 (three) times daily.   Past Month at Unknown time  . cloNIDine (CATAPRES) 0.1 MG tablet Take 1 tablet (0.1 mg total) by mouth 2 (two) times daily. 60 tablet 0 Past Month at Unknown time  . gabapentin (NEURONTIN) 300 MG capsule Take 1,200 mg by mouth 2 (two) times daily.   Past Month at Unknown time  . QUEtiapine (SEROQUEL) 100 MG tablet Take 100 mg by mouth at bedtime.   Past Month  at Unknown time  . [DISCONTINUED] cyclobenzaprine (FLEXERIL) 10 MG tablet Take 10 mg by mouth 3 (three) times daily as needed for muscle spasms.   Past Month at Unknown time    Treatment Modalities: Medication Management, Group therapy, Case management,  1 to 1 session with clinician, Psychoeducation, Recreational therapy.  Patient Stressors: Financial difficulties Medication change or noncompliance Substance abuse Other: homeless  Patient Strengths: Active sense of humor Communication skills General fund of knowledge Motivation for treatment/growth  Physician Treatment Plan for Primary Diagnosis: MDD (major depressive disorder), recurrent severe, without psychosis (HCC) Long Term Goal(s): Improvement in symptoms so as ready for discharge  Short Term Goals: Ability to identify changes in lifestyle to reduce recurrence of condition will  improve Ability to verbalize feelings will improve Ability to disclose and discuss suicidal ideas Ability to demonstrate self-control will improve Ability to identify and develop effective coping behaviors will improve Compliance with prescribed medications will improve Ability to identify triggers associated with substance abuse/mental health issues will improve  Medication Management: Evaluate patient's response, side effects, and tolerance of medication regimen.  Therapeutic Interventions: 1 to 1 sessions, Unit Group sessions and Medication administration.  Evaluation of Outcomes: Adequate for Discharge  Physician Treatment Plan for Secondary Diagnosis: Principal Problem:   MDD (major depressive disorder), recurrent severe, without psychosis (HCC) Active Problems:   Cocaine use disorder, severe, dependence (HCC)   Substance induced mood disorder (HCC)   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HCC)   Chronic pain syndrome   Long Term Goal(s): Improvement in symptoms so as ready for discharge  Short Term Goals: Ability to identify changes in lifestyle to reduce recurrence of condition will improve Ability to verbalize feelings will improve Ability to disclose and discuss suicidal ideas Ability to demonstrate self-control will improve Ability to identify and develop effective coping behaviors will improve Compliance with prescribed medications will improve Ability to identify triggers associated with substance abuse/mental health issues will improve  Medication Management: Evaluate patient's response, side effects, and tolerance of medication regimen.  Therapeutic Interventions: 1 to 1 sessions, Unit Group sessions and Medication administration.  Evaluation of Outcomes: Adequate for Discharge   RN Treatment Plan for Primary Diagnosis: MDD (major depressive disorder), recurrent severe, without psychosis (HCC) Long Term Goal(s): Knowledge of disease and therapeutic regimen to  maintain health will improve  Short Term Goals: Ability to verbalize feelings will improve, Ability to disclose and discuss suicidal ideas and Ability to identify and develop effective coping behaviors will improve  Medication Management: RN will administer medications as ordered by provider, will assess and evaluate patient's response and provide education to patient for prescribed medication. RN will report any adverse and/or side effects to prescribing provider.  Therapeutic Interventions: 1 on 1 counseling sessions, Psychoeducation, Medication administration, Evaluate responses to treatment, Monitor vital signs and CBGs as ordered, Perform/monitor CIWA, COWS, AIMS and Fall Risk screenings as ordered, Perform wound care treatments as ordered.  Evaluation of Outcomes: Adequate for Discharge   LCSW Treatment Plan for Primary Diagnosis: MDD (major depressive disorder), recurrent severe, without psychosis (HCC) Long Term Goal(s): Safe transition to appropriate next level of care at discharge, Engage patient in therapeutic group addressing interpersonal concerns.  Short Term Goals: Engage patient in aftercare planning with referrals and resources, Identify triggers associated with mental health/substance abuse issues and Increase skills for wellness and recovery  Therapeutic Interventions: Assess for all discharge needs, 1 to 1 time with Social worker, Explore available resources and support systems, Assess for adequacy in community support network,  Educate family and significant other(s) on suicide prevention, Complete Psychosocial Assessment, Interpersonal group therapy.  Evaluation of Outcomes: Adequate for Discharge   Progress in Treatment: Attending groups: Pt is new to milieu, continuing to assess  Participating in groups: Pt is new to milieu, continuing to assess  Taking medication as prescribed: Yes, MD continues to assess for medication changes as needed Toleration medication: Yes, no  side effects reported at this time Family/Significant other contact made: No, CSW assessing for appropriate contact Patient understands diagnosis: Continuing to assess Discussing patient identified problems/goals with staff: Yes Medical problems stabilized or resolved: Yes Denies suicidal/homicidal ideation: Yes Issues/concerns per patient self-inventory: None Other: N/A  New problem(s) identified: None identified at this time.   New Short Term/Long Term Goal(s): None identified at this time.   Discharge Plan or Barriers: [facility] [place]. He will also follow-up with [facility] and [facility] for opt services.  Reason for Continuation of Hospitalization: None identified at this time.   Estimated Length of Stay: 0 days  Attendees: Patient: 09/01/2016  10:26 AM  Physician: Dr. Jama Flavorsobos 09/01/2016  10:26 AM  Nursing: Noreene FilbertPatrice, Marian, RN 09/01/2016  10:26 AM  RN Care Manager: Onnie BoerJennifer Clark, RN 09/01/2016  10:26 AM  Social Worker: Vernie ShanksLauren Radie Berges, LCSW; Donnelly StagerLynn Bryant, LCSWA 09/01/2016  10:26 AM  Recreational Therapist:  09/01/2016  10:26 AM  Other: Armandina StammerAgnes Nwoko, NP; Gray BernhardtMay Augustin, NP 09/01/2016  10:26 AM  Other:  09/01/2016  10:26 AM  Other: 09/01/2016  10:26 AM    Scribe for Treatment Team: Verdene LennertLauren C Collyn Ribas, LCSW 09/01/2016 10:26 AM

## 2016-09-01 NOTE — [facility] Suicide Risk Assessment (Signed)
[facility]   Principal Problem: MDD (major depressive disorder), recurrent severe, without psychosis (HCC) Discharge Diagnoses:  Patient Active Problem List   Diagnosis Date Noted  . MDD (major depressive disorder), recurrent severe, without psychosis (HCC) [F33.2] 08/30/2016  . 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 Chronic pain syndrome [G89.4] 08/30/2016  . Substance induced mood disorder (HCC) [F19.94] 08/29/2016  . Intermittent explosive disorder [F63.81] 06/14/2016  . Cocaine use disorder, severe, dependence (HCC) [F14.20] 06/14/2016  . Polysubstance dependence including opioid drug with daily use (HCC) [F19.20] 07/16/2015  . Cocaine abuse with cocaine-induced mood disorder [facility](HCC) [F14.14] 07/16/2015    Total Time spent with patient: 30 minutes  Musculoskeletal: Strength & Muscle Tone: within normal limits Gait & Station: normal Patient leans: N/A  Psychiatric Specialty Exam: ROS no chest pain, no shortness of breath, no vomiting , no fever, no chills   Blood pressure 118/84, pulse 90, temperature 98.5 F (36.9 C), temperature source Oral, resp. rate 16, height 5\' 7"  (1.702 m), weight 68.5 kg (151 lb).Body mass index is 23.65 kg/m.  General Appearance: Fairly Groomed  Patent attorneyye Contact::  Good  Speech:  Normal Rate409  Volume:  Decreased  Mood:  reports mood is improved   Affect:  mildly irritable  Thought Process:  Linear and Descriptions of Associations: Intact  Orientation:  Full (Time, Place, and Person)  Thought Content:  denies hallucinations, no delusions  Suicidal Thoughts:  No denies any suicidal or self injurious ideations  Homicidal Thoughts:  No denies any homicidal or violent ideations  Memory:  recent and remote grossly intact   Judgement:  Fair- improving  Insight:  Fair- improving   Psychomotor Activity:  Normal  Concentration:  Good  Recall:  Good  Fund of Knowledge:Good  Language: Good  Akathisia:   Negative  Handed:  Right  AIMS (if indicated):     Assets:  Communication Skills Desire for Improvement Social Support  Sleep:  Number of Hours: 6  Cognition: WNL  ADL's:  Intact   Mental Status Per Nursing Assessment::   On Admission:     Demographic Factors:  31 year old , single, has a 32 year old son, who lives with a grandmother  Loss Factors: Family stressors, limited support network  Historical Factors: History of cocaine dependence, history of mood disorder   Risk Reduction Factors:   Positive coping skills or problem solving skills  Continued Clinical Symptoms:  Patient is feeling better, states his mood is "OK", affect is mildly irritable, but reactive, no thought disorder, no suicidal or self injurious ideations, no delusions, no hallucinations, future oriented. At this time focused on going into a residential setting Denies cravings  No overtly disruptive or agitated behaviors on unit . Denies medication side effects and states he feels they are helping - we have reviewed medication side effects, including potential risks of mixing Buproprion and Cocaine- patient expresses understanding, states he is motivated in sobriety.    Cognitive Features That Contribute To Risk:  No gross cognitive deficits noted upon discharge. Is alert , attentive, and oriented x 3   Suicide Risk:  Mild:  Suicidal ideation of limited frequency, intensity, duration, and specificity.  There are no identifiable plans, no associated intent, mild dysphoria and related symptoms, good self-control (both objective and subjective assessment), few other risk factors, and identifiable protective factors, including available and accessible social support.  Follow-up Information    [facility] Follow up on 09/05/2016.   Why:  at 8:30am for your initial assessment for medication management. [facility] will transport you to this appointment. Contact information: [address] [place] [address] [phone]        [facility] Follow up.   Why:  [facility] staff will take you to your appointment on Friday 5/17 at 9:00am to get you set up with the [facility] program Contact information: [address] [place], [place] [address] Office: [phone]  Fax: [phone]          Plan Of Care/Follow-up recommendations:  Activity:  as tolerated  Diet:  Heart healthy Tests:  NA Other:  See below Patient is requesting discharge and there are no current grounds for involuntary commitment He is future oriented and reports focus on continuing to work on his sobriety.  He is going to [facility], which is a sober living setting and also plans to follow up at [facility]  Craige Cotta, MD 09/01/2016, 12:09 PM

## 2016-09-01 NOTE — Progress Notes (Signed)
Adult Psychoeducational Group Note  Date:  09/01/2016 Time:  6:01 AM  Group Topic/Focus:  Wrap-Up Group:   The focus of this group is to help patients review their daily goal of treatment and discuss progress on daily workbooks.  Participation Level:  Active  Participation Quality:  Appropriate, Attentive and Sharing  Affect:  Appropriate  Cognitive:  Appropriate  Insight: Appropriate  Engagement in Group:  Developing/Improving  Modes of Intervention:  Discussion  Additional Comments:  Pt shared in group he is discharging tomorrow. Pt shared he had good news today, stated his court date got dismissed and when he discharges he will be going to a halfway house. Pt stated he just needs to work on his thinking about wanting to use substance again. Pt encouraged by Clinical research associatewriter and peers. Pt stated he is ready for discharge and have follow up next level of care providers to see when he leaves.  Karleen HampshireFox, Dariona Postma Brittini 09/01/2016, 6:01 AM

## 2016-09-01 NOTE — Progress Notes (Signed)
   D: Pt appeared to be in a better mood today than previous day. Pt was less agitated and irritable. Was more pleasant when speaking to the writer. When asked about his day pt stated, "I didn't have to get on too many nurses and techs today. I realize they didn't make the rules, they were just hired to follow them". Pt has no questions or concerns.    A:  Support and encouragement was offered. 15 min checks continued for safety.  R: Pt remains safe.

## 2016-09-01 NOTE — BHH Group Notes (Signed)
BHH Group Notes:  (Nursing/MHT/Case Management/Adjunct)  Date:  09/01/2016  Time:  0900  Type of Therapy:  Nurse Education - Unit Orientation  Participation Level:  Active  Participation Quality:  Attentive  Affect:  Flat and Irritable  Cognitive:  Alert  Insight:  Improving  Engagement in Group:  Engaged  Modes of Intervention:  Education  Summary of Progress/Problems: Patient oriented to unit, educated on unit expectations.   Merian CapronFriedman, Chan Sheahan [facility] 09/01/2016, 9:42 AM

## 2016-09-01 NOTE — Progress Notes (Addendum)
D: Spoke with patient 1:1 who is up and visible in the milieu. Patient's affect irritable with anxious, labile mood. Rating depression at a 2/10, hopelessness at a 3/10and anxiety at a 7/10. Rates sleep as fair, appetite as good, energy as hyper and concentration as good.  States goal for today is to "stay humble." Reporting back pain of a 4/10 having received flexeril around 0500. No other physical problems.   A: Medicated per orders, prn nicorette given. Emotional support offered and self inventory reviewed. Encouraged completion of Suicide Safety Plan. Discussed POC with MD, SW.  Fall prevention plan in place and reviewed.   R: Patient verbalizes understanding of POC, falls teaching. On reassess, patient reports decreased nicotine cravings. Patient denies SI/HI and remains safe on level III obs. Awaiting treatment team as patient expresses desire to discharge today.

## 2016-09-01 NOTE — Progress Notes (Signed)
Patient verbalizes readiness for discharge. Follow up plan explained, AVS, transition record and SRA given along with prescriptions. Sample meds provided. All belongings returned. Patient verbalizes understanding. Denies SI/HI and assures this writer he will seek assistance should that change. Patient discharged ambulatory and in stable condition.  

## 2016-09-01 NOTE — Progress Notes (Signed)
  [facility] :  Will you be returning to the same living situation after discharge:  No. At discharge, do you have transportation home?: Yes,  [facility] Do you have the ability to pay for your medications: Yes,  Pt provided with samples and prescriptions  Release of information consent forms completed and in the chart;  Patient's signature needed at discharge.  Patient to Follow up at: Follow-up Information    [facility] Follow up on 09/05/2016.   Why:  at 8:30am for your initial assessment for medication management. [facility] will transport you to this appointment. Contact information: [address] [place] [address] [phone]        [facility] Follow up.   Why:  [facility] staff will take you to your appointment on Friday 5/17 at 9:00am to [facility] Contact information: [address] [place], [place] [address] Office: [phone]  Fax: [phone]          Next level of care provider has [facility] Link:no  Safety Planning and Suicide Prevention discussed: Yes,  with Pt; declines family contact  Have you used any form of tobacco in the last 30 days? (Cigarettes, Smokeless Tobacco, Cigars, and/or Pipes): Yes  Has patient been referred to the Quitline?: Patient refused referral  Patient has been referred for addiction treatment: Yes  Verdene LennertLauren C Mandy Peeks 09/01/2016, 10:27 AM

## 2016-09-01 NOTE — Discharge Summary (Signed)
Physician Discharge Summary Note  Patient:  Theodore Carr is an 31 y.o., male MRN:  [id number] DOB:  04/30/1985 Patient phone:  [phone] (home)  Patient address:   [place] [address],  Total Time spent with patient: Greater than 30 minutes  Date of Admission:  08/29/2016 Date of Discharge: 09-01-16  Reason for Admission: Worsening symptoms of depression  Principal Problem: MDD (major depressive disorder), recurrent severe, without psychosis [facility])  Discharge Diagnoses: Patient Active Problem List   Diagnosis Date Noted  . Cocaine use disorder, severe, dependence (HCC) [F14.20] 06/14/2016    Priority: High  . Cocaine abuse with cocaine-induced mood disorder (HCC) [F14.14] 07/16/2015    Priority: Medium  . MDD (major depressive disorder), recurrent severe, without psychosis (HCC) [F33.2] 08/30/2016  . 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 Chronic pain syndrome [G89.4] 08/30/2016  . Substance induced mood disorder (HCC) [F19.94] 08/29/2016  . Intermittent explosive disorder [F63.81] 06/14/2016  . Polysubstance dependence including opioid drug with daily use [facility]) [F19.20] 07/16/2015   Past Psychiatric History: Substance induced mood disorder, Cocaine use disorder.  Past Medical History:  Past Medical History:  Diagnosis Date  . Compression fracture of T12 vertebra (HCC) 2008  . Depression   . Hypertension    History reviewed. No pertinent surgical history.  Family History:  Family History  Problem Relation Age of Onset  . Drug abuse Cousin    Family Psychiatric  History: See H&P  Social History:  History  Alcohol Use No     History  Drug Use No    Comment: not in 2 years    Social History   Social History  . Marital status: Single    Spouse name: N/A  . Number of children: N/A  . Years of education: N/A   Social History Main Topics  . Smoking status: Current Every Day Smoker    Packs/day: 0.50    Types: Cigarettes   . Smokeless tobacco: Never Used  . Alcohol use No  . Drug use: No     Comment: not in 2 years  . Sexual activity: Yes   Other Topics Concern  . None   Social History Narrative  . None   Hospital Course: (Per H&P): This is an admission assessment for Joshu, a 31 year old Caucasian male with hx of Cocaine use disorder & substance induced mood disorder. He is known in this hospital from previous admissions. Admitted to the [facility] adult unit under involuntary commitment from the [facility] with complaints of worsening symptoms of depression &  drug use (cocaine). He also reports that he has been off of his medications since 08-09-16. During this admission, Jeremiyah reports, "I'm here because I want to get back on my medicines. I have not been on my medicine since 08-09-16. I have been walking the streets, smoking/snorting crack cocaine. I could not get the mental health at the health department give me my mental health medicines. They told me that I have to give them 2 hour notice prior to coming to get my medicines. I'm so much in pain & aggravated. I need to get back on my medicines so that I can go out there & find me a place to live. I'm not suicidal, homicidal, no voices & I'm not seeing things".  Upon his arrival & admision to the adult unit, Angelino was evaluated & his presenting symptoms identified. His lab results upon admission showed his UDS positive for Cocaine. Reports indicated that she has hx  of substance dependence. However, on his arrival to the [facility] adult unit, Criss was not presenting with any substance withdrawal symptoms. As a result did not receive any detoxification treatments. He was however, started on medications for mood.   After evaluation of presenting symptoms, Delawrence was medicated & discharged on; Buspar 15 mg for anxiety, Wellbutrin XL 150 mg for depression, Tegretol 200 mg for mood stabilization, Gabapentin 300 mg for agitation, Hydroxyzine 25 mg prn for anxiety,  Nicotine gum for nicotine withdrawal symptoms & Seroquel 50 mg for mood control. He was enrolled & encouraged to participate in the unit the group counseling sessions being offered & held on this unit. He learned coping skills. Plez presented other significant health issues that required treatment or monitoring. He was resumed on all his pertinent home medications for those health issues. He tolerated his treatment regimen without any adverse effects or reactions reported.  During the course of his hospitalization, Dennie was evaluated on daily basis by the clinical providers to assure his response to his treatment regimen.As his treatment progressed, improvement was noted as evidenced by his reports of decreasing symptoms, improved mood, affect, medication tolerance & active participation in the unit programming.he was encouraged to update his providers on his progress by daily completion of a self inventory assessment, noting mood, mental status, pain, any new symptoms, anxiety and or concerns.  Deandre's symptoms responded well to his treatment regimen combined with a therapeutic and supportive environment. Upon his hospital discharge, Markise was in much improved condition than upon admission.His symptoms were reported as significantly decreased or resolved completely. He adamantly denies any SI/HI,  AVH, delusional thoughts & or paranoia. He was motivated to continue taking medication with a goal of continued improvement in mental health. He will continue psychiatric care on an outpatient basis as noted below. He is provided with all the necessary information required to make this appointment without problems. Deaunte left [facility] with all personal belongings in no apparent distress. Transportation per friend.  Physical Findings: AIMS: Facial and Oral Movements Muscles of Facial Expression: None, normal Lips and Perioral Area: None, normal Jaw: None, normal Tongue: None, normal,Extremity  Movements Upper (arms, wrists, hands, fingers): None, normal Lower (legs, knees, ankles, toes): None, normal, Trunk Movements Neck, shoulders, hips: None, normal, Overall Severity Severity of abnormal movements (highest score from questions above): None, normal Incapacitation due to abnormal movements: None, normal Patient's awareness of abnormal movements (rate only patient's report): No Awareness, Dental Status Current problems with teeth and/or dentures?: No Does patient usually wear dentures?: No  CIWA:    COWS:     Musculoskeletal: Strength & Muscle Tone: within normal limits Gait & Station: normal Patient leans: N/A  Psychiatric Specialty Exam: Physical Exam  Constitutional: He appears well-developed.  HENT:  Head: Normocephalic.  Eyes: Pupils are equal, round, and reactive to light.  Neck: Normal range of motion.  Cardiovascular: Normal rate.   Respiratory: Effort normal.  GI: Soft.  Genitourinary:  Genitourinary Comments: Deferred  Musculoskeletal: Normal range of motion.  Neurological: He is alert.  Skin: Skin is warm.    Review of Systems  Constitutional: Negative.   HENT: Negative.   Eyes: Negative.   Cardiovascular: Negative.   Gastrointestinal: Negative.   Genitourinary: Negative.   Musculoskeletal: Negative.   Skin: Negative.   Endo/Heme/Allergies: Negative.   Psychiatric/Behavioral: Positive for depression (Stable) and substance abuse (Hx. Cocaine use disorder). Negative for hallucinations, memory loss and suicidal ideas. The patient has insomnia (Stable). The patient is not nervous/anxious.  Blood pressure 118/84, pulse 90, temperature 98.5 F (36.9 C), temperature source Oral, resp. rate 16, height 5\' 7"  (1.702 m), weight 68.5 kg (151 lb).Body mass index is 23.65 kg/m.  See Md's SRA   Have you used any form of tobacco in the last 30 days? (Cigarettes, Smokeless Tobacco, Cigars, and/or Pipes): Yes  Has this patient used any form of tobacco in the  last 30 days? (Cigarettes, Smokeless Tobacco, Cigars, and/or Pipes):Yes, provided with a prescription for Nicorette gum.  Blood Alcohol level:  Lab Results  Component Value Date   [facility] <5 08/18/2016   ETH <5 06/13/2016   Metabolic Disorder Labs:  Lab Results  Component Value Date   HGBA1C 5.2 08/31/2016   MPG 103 08/31/2016   Lab Results  Component Value Date   PROLACTIN 28.5 (H) 08/31/2016   Lab Results  Component Value Date   CHOL 172 08/31/2016   TRIG 218 (H) 08/31/2016   HDL 52 08/31/2016   CHOLHDL 3.3 08/31/2016   VLDL 44 (H) 08/31/2016   LDLCALC 76 08/31/2016   See Psychiatric Specialty Exam and Suicide Risk Assessment completed by Attending Physician prior to discharge.  Discharge destination:  Other:  [facility]  Is patient on multiple antipsychotic therapies at discharge:  No   Has Patient had three or more failed trials of antipsychotic monotherapy by history:  No  Recommended Plan for Multiple Antipsychotic Therapies: NA  Allergies as of 09/01/2016      Reactions   Amoxicillin Other (See Comments)   Reaction:  Unknown; childhood reaction Has patient had a PCN reaction causing immediate rash, facial/tongue/throat swelling, SOB or lightheadedness with hypotension: Unsure Has patient had a PCN reaction causing severe rash involving mucus membranes or skin necrosis: Unsure Has patient had a PCN reaction that required hospitalization Unsure Has patient had a PCN reaction occurring within the last 10 years: No If all of the above answers are "NO", then may proceed with Cephalosporin use.   Haloperidol And Related Other (See Comments)   Pt states that this medication locks his body up.        Medication List    STOP taking these medications   buPROPion 100 MG 12 hr tablet Commonly known as:  WELLBUTRIN SR Replaced by:  buPROPion 150 MG 24 hr tablet     TAKE these medications     Indication  buPROPion 150 MG 24 hr tablet Commonly known as:  WELLBUTRIN  XL Take 1 tablet (150 mg total) by mouth daily. For depression Replaces:  buPROPion 100 MG 12 hr tablet  Indication:  Major Depressive Disorder   busPIRone 15 MG tablet Commonly known as:  BUSPAR Take 1 tablet (15 mg total) by mouth 3 (three) times daily. For anxiety What changed:  medication strength  how much to take  additional instructions  Indication:  Generalized Anxiety Disorder   carbamazepine 200 MG tablet Commonly known as:  TEGRETOL Take 1 tablet (200 mg total) by mouth 3 (three) times daily. For mood stabilization What changed:  additional instructions  Indication:  Mood stabilization   cloNIDine 0.1 MG tablet Commonly known as:  CATAPRES Take 1 tablet (0.1 mg total) by mouth at bedtime. For high blood pressure What changed:  when to take this  additional instructions  Indication:  High Blood Pressure Disorder   cyclobenzaprine 10 MG tablet Commonly known as:  FLEXERIL Take 1 tablet (10 mg total) by mouth 3 (three) times daily as needed for muscle spasms.  Indication:  Muscle Spasm  gabapentin 300 MG capsule Commonly known as:  NEURONTIN Take 1 capsule (300 mg total) by mouth 4 (four) times daily - after meals and at bedtime. For agitation What changed:  how much to take  when to take this  additional instructions  Indication:  Agitation   hydrocortisone cream 0.5 % Apply topically 3 (three) times daily as needed for itching. For itching  Indication:  Itching   hydrOXYzine 25 MG tablet Commonly known as:  ATARAX/VISTARIL Take 1 tablet (25 mg total) by mouth every 6 (six) hours as needed for anxiety.  Indication:  Anxiety Neurosis   nicotine polacrilex 2 MG gum Commonly known as:  NICORETTE Take 1 each (2 mg total) by mouth as needed for smoking cessation.  Indication:  Nicotine Addiction   QUEtiapine 50 MG tablet Commonly known as:  SEROQUEL Take 1 tablet (50 mg total) by mouth at bedtime. For mood control What changed:  medication  strength  how much to take  additional instructions  Indication:  Mood control      Follow-up Information    [facility] Follow up on 09/05/2016.   Why:  at 8:30am for your initial assessment for medication management. [facility] will transport you to this appointment. Contact information: [address] [place] [address] [phone]        [facility] Follow up.   Why:  [facility] staff will take you to your appointment on Friday 5/17 at 9:00am to get you set [facility] with [facility] Contact information: [address] [place], [place] [address] Office: [phone]  Fax: [phone]         Follow-up recommendations: Activity:  As tolerated Diet: As recommended by your primary care doctor. Keep all scheduled follow-up appointments as recommended.  Comments: Patient is instructed prior to discharge to: Take all medications as prescribed by his/her mental healthcare provider. Report any adverse effects and or reactions from the medicines to his/her outpatient provider promptly. Patient has been instructed & cautioned: To not engage in alcohol and or illegal drug use while on prescription medicines. In the event of worsening symptoms, patient is instructed to call the crisis hotline, 911 and or go to the nearest ED for appropriate evaluation and treatment of symptoms. To follow-up with his/her primary care provider for your other medical issues, concerns and or health care needs.   Signed: Sanjuana KavaNwoko, Agnes I, NP, PMHNP, FNP-BC 09/05/2016, 9:41 AM

## 2017-06-05 ENCOUNTER — Encounter (HOSPITAL_COMMUNITY): Payer: Self-pay | Admitting: Emergency Medicine

## 2017-06-05 ENCOUNTER — Emergency Department (HOSPITAL_COMMUNITY): Payer: Self-pay

## 2017-06-05 ENCOUNTER — Other Ambulatory Visit: Payer: Self-pay

## 2017-06-05 ENCOUNTER — Emergency Department (HOSPITAL_COMMUNITY)
Admission: EM | Admit: 2017-06-05 | Discharge: 2017-06-06 | Disposition: A | Discharge status: Home / Self Care | Payer: Self-pay | Attending: Emergency Medicine | Admitting: Emergency Medicine

## 2017-06-05 DIAGNOSIS — Y929 Unspecified place or not applicable: Secondary | ICD-10-CM | POA: Insufficient documentation

## 2017-06-05 DIAGNOSIS — Y999 Unspecified external cause status: Secondary | ICD-10-CM | POA: Insufficient documentation

## 2017-06-05 DIAGNOSIS — I10 Essential (primary) hypertension: Secondary | ICD-10-CM | POA: Insufficient documentation

## 2017-06-05 DIAGNOSIS — S01511A Laceration without foreign body of lip, initial encounter: Secondary | ICD-10-CM | POA: Insufficient documentation

## 2017-06-05 DIAGNOSIS — Y939 Activity, unspecified: Secondary | ICD-10-CM | POA: Insufficient documentation

## 2017-06-05 DIAGNOSIS — W500XXA Accidental hit or strike by another person, initial encounter: Secondary | ICD-10-CM | POA: Insufficient documentation

## 2017-06-05 DIAGNOSIS — F17210 Nicotine dependence, cigarettes, uncomplicated: Secondary | ICD-10-CM | POA: Insufficient documentation

## 2017-06-05 DIAGNOSIS — Z79899 Other long term (current) drug therapy: Secondary | ICD-10-CM | POA: Insufficient documentation

## 2017-06-05 DIAGNOSIS — R45851 Suicidal ideations: Secondary | ICD-10-CM | POA: Insufficient documentation

## 2017-06-05 LAB — CBC WITH DIFFERENTIAL/PLATELET
Basophils Absolute: 0 10*3/uL (ref 0.0–0.1)
Basophils Relative: 0 %
EOS PCT: 1 %
Eosinophils Absolute: 0.1 10*3/uL (ref 0.0–0.7)
HCT: 40.9 % (ref 39.0–52.0)
HEMOGLOBIN: 14.8 g/dL (ref 13.0–17.0)
LYMPHS ABS: 1.3 10*3/uL (ref 0.7–4.0)
LYMPHS PCT: 10 %
MCH: 32 pg (ref 26.0–34.0)
MCHC: 36.2 g/dL — AB (ref 30.0–36.0)
MCV: 88.3 fL (ref 78.0–100.0)
MONOS PCT: 8 %
Monocytes Absolute: 1.1 10*3/uL — ABNORMAL HIGH (ref 0.1–1.0)
Neutro Abs: 10.5 10*3/uL — ABNORMAL HIGH (ref 1.7–7.7)
Neutrophils Relative %: 81 %
PLATELETS: 250 10*3/uL (ref 150–400)
RBC: 4.63 MIL/uL (ref 4.22–5.81)
RDW: 13.2 % (ref 11.5–15.5)
WBC: 13 10*3/uL — ABNORMAL HIGH (ref 4.0–10.5)

## 2017-06-05 MED ORDER — TETANUS-DIPHTH-ACELL PERTUSSIS 5-2.5-18.5 LF-MCG/0.5 IM SUSP
0.5000 mL | Freq: Once | INTRAMUSCULAR | Status: AC
  Administered 2017-06-05: 0.5 mL via INTRAMUSCULAR
  Filled 2017-06-05: qty 0.5

## 2017-06-05 MED ORDER — LIDOCAINE-EPINEPHRINE (PF) 2 %-1:200000 IJ SOLN
10.0000 mL | Freq: Once | INTRAMUSCULAR | Status: AC
  Administered 2017-06-05: 10 mL
  Filled 2017-06-05: qty 20

## 2017-06-05 NOTE — ED Notes (Signed)
Bed: RESB Expected date:  Expected time:  Means of arrival:  Comments: EMS 32 yo male head injuries/lacerations to head and mouth-SI attempt banging head on wall

## 2017-06-05 NOTE — ED Notes (Signed)
ED Provider at bedside. 

## 2017-06-05 NOTE — ED Triage Notes (Signed)
Pt states he does not know exactly what happened tonight  States he is feeling suicidal  Pt is c/o pain to his face and head
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
## 2017-06-06 LAB — RAPID URINE DRUG SCREEN, HOSP PERFORMED
AMPHETAMINES: NOT DETECTED
Barbiturates: NOT DETECTED
Benzodiazepines: NOT DETECTED
COCAINE: POSITIVE — AB
Opiates: NOT DETECTED
TETRAHYDROCANNABINOL: POSITIVE — AB

## 2017-06-06 LAB — SALICYLATE LEVEL: Salicylate Lvl: <7 mg/dL (ref 2.8–30.0)

## 2017-06-06 LAB — COMPREHENSIVE METABOLIC PANEL
ALK PHOS: 68 U/L (ref 38–126)
ALT: 32 U/L (ref 17–63)
AST: 31 U/L (ref 15–41)
Albumin: 4.1 g/dL (ref 3.5–5.0)
Anion gap: 11 (ref 5–15)
BILIRUBIN TOTAL: 0.6 mg/dL (ref 0.3–1.2)
BUN: 12 mg/dL (ref 6–20)
CALCIUM: 9.1 mg/dL (ref 8.9–10.3)
CO2: 21 mmol/L — AB (ref 22–32)
CREATININE: 1.31 mg/dL — AB (ref 0.61–1.24)
Chloride: 107 mmol/L (ref 101–111)
Glucose, Bld: 128 mg/dL — ABNORMAL HIGH (ref 65–99)
Potassium: 3.2 mmol/L — ABNORMAL LOW (ref 3.5–5.1)
Sodium: 139 mmol/L (ref 135–145)
Total Protein: 6.9 g/dL (ref 6.5–8.1)

## 2017-06-06 LAB — ACETAMINOPHEN LEVEL

## 2017-06-06 LAB — ETHANOL: Alcohol, Ethyl (B): <10 mg/dL (ref <10)

## 2017-06-06 MED ORDER — ACETAMINOPHEN 325 MG PO TABS: 650.0000 mg | ORAL_TABLET | ORAL | Status: DC | PRN

## 2017-06-06 MED ORDER — GABAPENTIN 300 MG PO CAPS: 300.0000 mg | ORAL_CAPSULE | Freq: Three times a day (TID) | ORAL | Status: DC

## 2017-06-06 MED ORDER — ONDANSETRON HCL 4 MG PO TABS: 4.0000 mg | ORAL_TABLET | Freq: Three times a day (TID) | ORAL | Status: DC | PRN

## 2017-06-06 MED ORDER — ACETAMINOPHEN 325 MG PO TABS
650.0000 mg | ORAL_TABLET | Freq: Once | ORAL | Status: AC
  Administered 2017-06-06: 650 mg via ORAL
  Filled 2017-06-06: qty 2

## 2017-06-06 MED ORDER — BUPROPION HCL ER (XL) 150 MG PO TB24: 150.0000 mg | ORAL_TABLET | Freq: Every day | ORAL | Status: DC

## 2017-06-06 MED ORDER — HYDROXYZINE HCL 25 MG PO TABS
25.0000 mg | ORAL_TABLET | Freq: Four times a day (QID) | ORAL | Status: DC | PRN
Start: 2017-06-06 — End: 2017-06-06

## 2017-06-06 MED ORDER — NICOTINE 7 MG/24HR TD PT24: 7.0000 mg | MEDICATED_PATCH | Freq: Every day | TRANSDERMAL | Status: DC

## 2017-06-06 MED ORDER — POTASSIUM CHLORIDE CRYS ER 20 MEQ PO TBCR
40.0000 meq | EXTENDED_RELEASE_TABLET | Freq: Once | ORAL | Status: AC
  Administered 2017-06-06: 40 meq via ORAL
  Filled 2017-06-06: qty 2

## 2017-06-06 MED ORDER — DOXYCYCLINE HYCLATE 100 MG PO CAPS: 100.0000 mg | ORAL_CAPSULE | Freq: Two times a day (BID) | ORAL | 0 refills | Status: DC

## 2017-06-06 MED ORDER — DOXYCYCLINE HYCLATE 100 MG PO TABS
100.0000 mg | ORAL_TABLET | Freq: Once | ORAL | Status: DC
  Filled 2017-06-06: qty 1

## 2017-06-06 MED ORDER — ALUM & MAG HYDROXIDE-SIMETH 200-200-20 MG/5ML PO SUSP: 30.0000 mL | Freq: Four times a day (QID) | ORAL | Status: DC | PRN

## 2017-06-06 MED ORDER — CLONIDINE HCL 0.1 MG PO TABS: 0.1000 mg | ORAL_TABLET | Freq: Every day | ORAL | Status: DC

## 2017-06-06 MED ORDER — BUSPIRONE HCL 10 MG PO TABS: 15.0000 mg | ORAL_TABLET | Freq: Three times a day (TID) | ORAL | Status: DC

## 2017-06-06 MED ORDER — QUETIAPINE FUMARATE 50 MG PO TABS: 50.0000 mg | ORAL_TABLET | Freq: Every day | ORAL | Status: DC

## 2017-06-06 MED ORDER — CARBAMAZEPINE 200 MG PO TABS: 200.0000 mg | ORAL_TABLET | Freq: Three times a day (TID) | ORAL | Status: DC
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
## 2017-06-06 NOTE — ED Notes (Signed)
Pt aware that urine sample is needed.  

## 2017-06-06 NOTE — ED Notes (Addendum)
Pt dc to [employer] custody for current warrant, when PO medication offered PO medication pt refused

## 2017-06-06 NOTE — Discharge Instructions (Signed)
Stitches will dissolve - you do not need to have them removed.  Take acetaminophen or ibuprofen as needed for pain.

## 2017-06-06 NOTE — ED Notes (Signed)
ED Provider at bedside suturing patient 
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
## 2022-04-02 ENCOUNTER — Emergency Department (HOSPITAL_COMMUNITY): Payer: Medicaid Other

## 2022-04-02 ENCOUNTER — Ambulatory Visit (HOSPITAL_COMMUNITY)
Admission: EM | Admit: 2022-04-02 | Discharge: 2022-04-02 | Disposition: A | Discharge status: Other Acute Inpatient Hospital | Payer: No Payment, Other | Attending: Behavioral Health | Admitting: Behavioral Health

## 2022-04-02 ENCOUNTER — Other Ambulatory Visit: Payer: Self-pay

## 2022-04-02 ENCOUNTER — Emergency Department (HOSPITAL_COMMUNITY)
Admission: EM | Admit: 2022-04-02 | Discharge: 2022-04-03 | Disposition: A | Discharge status: Other Acute Inpatient Hospital | Payer: Medicaid Other | Attending: Emergency Medicine | Admitting: Emergency Medicine

## 2022-04-02 DIAGNOSIS — S0990XA Unspecified injury of head, initial encounter: Secondary | ICD-10-CM

## 2022-04-02 DIAGNOSIS — Z1152 Encounter for screening for COVID-19: Secondary | ICD-10-CM | POA: Insufficient documentation

## 2022-04-02 DIAGNOSIS — F339 Major depressive disorder, recurrent, unspecified: Secondary | ICD-10-CM | POA: Insufficient documentation

## 2022-04-02 DIAGNOSIS — Y9301 Activity, walking, marching and hiking: Secondary | ICD-10-CM | POA: Insufficient documentation

## 2022-04-02 DIAGNOSIS — M542 Cervicalgia: Secondary | ICD-10-CM | POA: Insufficient documentation

## 2022-04-02 DIAGNOSIS — Z9151 Personal history of suicidal behavior: Secondary | ICD-10-CM | POA: Insufficient documentation

## 2022-04-02 DIAGNOSIS — R45851 Suicidal ideations: Secondary | ICD-10-CM | POA: Diagnosis not present

## 2022-04-02 DIAGNOSIS — W01198A Fall on same level from slipping, tripping and stumbling with subsequent striking against other object, initial encounter: Secondary | ICD-10-CM | POA: Insufficient documentation

## 2022-04-02 DIAGNOSIS — S0081XA Abrasion of other part of head, initial encounter: Secondary | ICD-10-CM | POA: Insufficient documentation

## 2022-04-02 DIAGNOSIS — Y9259 Other trade areas as the place of occurrence of the external cause: Secondary | ICD-10-CM | POA: Diagnosis not present

## 2022-04-02 DIAGNOSIS — R440 Auditory hallucinations: Secondary | ICD-10-CM | POA: Insufficient documentation

## 2022-04-02 DIAGNOSIS — Z5900 Homelessness unspecified: Secondary | ICD-10-CM | POA: Insufficient documentation

## 2022-04-02 DIAGNOSIS — W1830XA Fall on same level, unspecified, initial encounter: Secondary | ICD-10-CM | POA: Insufficient documentation

## 2022-04-02 DIAGNOSIS — Z79899 Other long term (current) drug therapy: Secondary | ICD-10-CM | POA: Insufficient documentation

## 2022-04-02 LAB — LIPID PANEL
Cholesterol: 201 mg/dL — ABNORMAL HIGH (ref 0–200)
HDL: 56 mg/dL (ref >40)
LDL Cholesterol: 104 mg/dL — ABNORMAL HIGH (ref 0–99)
Total CHOL/HDL Ratio: 3.6 RATIO
Triglycerides: 207 mg/dL — ABNORMAL HIGH (ref <150)
VLDL: 41 mg/dL — ABNORMAL HIGH (ref 0–40)

## 2022-04-02 LAB — POCT URINE DRUG SCREEN - MANUAL ENTRY (I-SCREEN)
POC Amphetamine UR: NOT DETECTED
POC Buprenorphine (BUP): NOT DETECTED
POC Cocaine UR: POSITIVE — AB
POC Marijuana UR: NOT DETECTED
POC Methadone UR: NOT DETECTED
POC Methamphetamine UR: NOT DETECTED
POC Morphine: NOT DETECTED
POC Oxazepam (BZO): NOT DETECTED
POC Oxycodone UR: NOT DETECTED
POC Secobarbital (BAR): NOT DETECTED

## 2022-04-02 LAB — COMPREHENSIVE METABOLIC PANEL
ALT: 44 U/L (ref 0–44)
AST: 132 U/L — ABNORMAL HIGH (ref 15–41)
Albumin: 4.7 g/dL (ref 3.5–5.0)
Alkaline Phosphatase: 58 U/L (ref 38–126)
Anion gap: 12 (ref 5–15)
BUN: 19 mg/dL (ref 6–20)
CO2: 22 mmol/L (ref 22–32)
Calcium: 9.9 mg/dL (ref 8.9–10.3)
Chloride: 103 mmol/L (ref 98–111)
Creatinine, Ser: 1.2 mg/dL (ref 0.61–1.24)
GFR, Estimated: >60 mL/min (ref >60)
Glucose, Bld: 92 mg/dL (ref 70–99)
Potassium: 3.6 mmol/L (ref 3.5–5.1)
Sodium: 137 mmol/L (ref 135–145)
Total Bilirubin: 0.7 mg/dL (ref 0.3–1.2)
Total Protein: 6.7 g/dL (ref 6.5–8.1)

## 2022-04-02 LAB — CBC WITH DIFFERENTIAL/PLATELET
Abs Immature Granulocytes: 0.05 10*3/uL (ref 0.00–0.07)
Basophils Absolute: 0.1 10*3/uL (ref 0.0–0.1)
Basophils Relative: 0 %
Eosinophils Absolute: 0.1 10*3/uL (ref 0.0–0.5)
Eosinophils Relative: 1 %
HCT: 39.4 % (ref 39.0–52.0)
Hemoglobin: 13.8 g/dL (ref 13.0–17.0)
Immature Granulocytes: 0 %
Lymphocytes Relative: 17 %
Lymphs Abs: 2.8 10*3/uL (ref 0.7–4.0)
MCH: 31.2 pg (ref 26.0–34.0)
MCHC: 35 g/dL (ref 30.0–36.0)
MCV: 88.9 fL (ref 80.0–100.0)
Monocytes Absolute: 1.6 10*3/uL — ABNORMAL HIGH (ref 0.1–1.0)
Monocytes Relative: 10 %
Neutro Abs: 11.7 10*3/uL — ABNORMAL HIGH (ref 1.7–7.7)
Neutrophils Relative %: 72 %
Platelets: 346 10*3/uL (ref 150–400)
RBC: 4.43 MIL/uL (ref 4.22–5.81)
RDW: 13 % (ref 11.5–15.5)
WBC: 16.3 10*3/uL — ABNORMAL HIGH (ref 4.0–10.5)
nRBC: 0 % (ref 0.0–0.2)

## 2022-04-02 LAB — ETHANOL: Alcohol, Ethyl (B): <10 mg/dL (ref <10)

## 2022-04-02 LAB — TSH: TSH: 1.7 u[IU]/mL (ref 0.350–4.500)

## 2022-04-02 LAB — RESP PANEL BY RT-PCR (RSV, FLU A&B, COVID)  RVPGX2
Influenza A by PCR: NEGATIVE
Influenza B by PCR: NEGATIVE
Resp Syncytial Virus by PCR: NEGATIVE
SARS Coronavirus 2 by RT PCR: NEGATIVE

## 2022-04-02 LAB — CARBAMAZEPINE LEVEL, TOTAL: Carbamazepine Lvl: 14.5 ug/mL — ABNORMAL HIGH (ref 4.0–12.0)

## 2022-04-02 MED ORDER — MAGNESIUM HYDROXIDE 400 MG/5ML PO SUSP: 30.0000 mL | Freq: Every day | ORAL | Status: DC | PRN

## 2022-04-02 MED ORDER — ALUM & MAG HYDROXIDE-SIMETH 200-200-20 MG/5ML PO SUSP: 30.0000 mL | ORAL | Status: DC | PRN

## 2022-04-02 MED ORDER — HYDROXYZINE HCL 25 MG PO TABS: 25.0000 mg | ORAL_TABLET | Freq: Three times a day (TID) | ORAL | Status: DC | PRN

## 2022-04-02 MED ORDER — ACETAMINOPHEN 325 MG PO TABS: 650.0000 mg | ORAL_TABLET | Freq: Four times a day (QID) | ORAL | Status: DC | PRN

## 2022-04-02 NOTE — Progress Notes (Signed)
   04/02/22 1743  [facility] Triage Screening (Walk-ins at [facility] only)  How Did You Hear About [place]? Self  What Is the Reason for Your Visit/Call Today? Pt is a 36 yo male who presented voluntarily and unaccomapnied due to worsening suicidal thoughts. Pt stated he was just paroled from prison yesterday after 5 years and is now homeless and hopeless. Pt stated that he is planning to either take all his prescribed medications at one time or hand himself from a tree by his pants. Pt stated that he plans to try one of those things if he does not get any help tonight. Pt stated that he has attempted suicide once before via hanging. Pt stated he has been admitted to a psychiatric facility multiple times in the past and has been on prescribed medications while incarcerated which he stated he now has possession of. Pt denied HI, NSSH and any current drug or alcohol use. Pt reported a history of alcohol, cannabis and cocaine use going back to when he was 31 or 36 yo and at times continuing while he was in prison. Pt stated his last use of alcohol and cocaine was 12/13 or14/2023. Pt stated that the alcohol was "homemade." Pt stated that since he left prison he has been hearing a voice telling him to kill himself.  How Long Has This Been Causing You Problems? > than 6 months  Have You Recently Had Any Thoughts About Hurting Yourself? Yes  How long ago did you have thoughts about hurting yourself? since yesterday  Are You Planning to Commit Suicide/Harm Yourself At This time? Yes  Have you Recently Had Thoughts About Hurting Someone Karolee Ohs? No  Are You Planning To Harm Someone At This Time? No  Are you currently experiencing any auditory, visual or other hallucinations? Yes  Please explain the hallucinations you are currently experiencing: voice with a command  Have You Used Any Alcohol or Drugs in the Past 24 Hours? No  Do you have any current medical co-morbidities that require immediate attention? No  Clinician  description of patient physical appearance/behavior: Pt was calm but restless, cooperative, alert and seemed oriented x 5. Pt's mood seemed depressed and his flat affect was confruent. Pt's insight and judgment seemed impaired. Pt;s speecha nd movement was within normal limits.  What Do You Feel Would Help You the Most Today? Treatment for Depression or other mood problem  If access to [facility] was not available, would you have sought care in the Emergency Department? Yes  Determination of Need Urgent (48 hours)  Options For Referral [facility]   Jordis Repetto T. Jimmye Norman, MS, [facility], [facility] Triage Specialist [facility]

## 2022-04-02 NOTE — ED Triage Notes (Signed)
Patient tripped and fell this morning hit his face against the ground , no LOC/ambulatory , mild redness at left forehead , requesting CT scan .

## 2022-04-02 NOTE — ED Provider Notes (Signed)
[facility] Continuous Assessment Admission H&P  Date: 04/02/22 Patient Name: Theodore Carr MRN: [id number] Chief Complaint:  Chief Complaint  Patient presents with   Suicidal      Diagnoses:  Final diagnoses:  MDD (major depressive disorder), recurrent episode, with atypical features ([place])  Suicidal ideation    HPI: Theodore Carr is a 36 year old male patient with a past psychiatric history significant for MDD, insomnia, intermittent explosive disorder, and polysubstance abuse who presented to the [facility] behavioral health urgent care voluntary unaccompanied with a chief complaint of suicidal ideations with a plan.  On evaluation, patient is alert and oriented x 4. His thought process is linear and speech is clear and coherent. His mood is depressed and affect is congruent. He has fair eye contact. He is causally dressed. He is calm and cooperative.  Patient endorses suicidal ideations with a plan to overdose on his prescription medications or hang himself. He is unable to contract for safety. He reports 1 past suicide attempt by hanging himself. He denies self-injurious behaviors. He denies homicidal ideations. He endorses auditory hallucinations telling him to kill himself today. He denies visual hallucinations. There is no objective evidence that the patient is currently responding to internal or external stimuli.  Patient reports feeling depressed and describes his symptoms as feeling alone, worthless, and hopeless. He states that he was released from prison yesterday after a 5-year sentence. He states that while he was incarcerated he spent most of his time in a single cell and felt like people were talking about him.    He states that he is prescribed Tegretol 100 mg in the morning and 200 mg at 2 PM, Neurontin 600 mg 3 times daily, BuSpar 15 mg in the morning and 30 mg at bedtime and Tricor 145 mg for high cholesterol and Norvasc 5 mg p.o. daily for high blood pressure.  He  reports occasional cocaine, marijuana and etoh use while incarcerated. He last used cocaine, marijuana, and alcohol on 12/13. He does not have outpatient psychiatry or counseling services at this time. He reports a past history of psychiatric inpatient treatment. He is currently homeless.   He states that this morning around 830 he fell and hit his face when he woke up. He states that at that time his nose and mouth was bleeding and he felt dizzy and shaky. He is noted to have facial abrasions. He was asymptomatic on exam. However, he complained of neck pain to the nursing staff.   PHQ 2-9:  [place] [facility] from 04/02/2022 in [facility]  Thoughts that you would be better off dead, or of hurting yourself in some way More than half the days  PHQ-9 Total Score 11       [place] [facility] from 04/02/2022 in [place] [facility] Risk        Total Time spent with patient: 45 minutes  Musculoskeletal  Strength & Muscle Tone: within normal limits Gait & Station: normal Patient leans: N/A  Psychiatric Specialty Exam  Presentation General Appearance:  Appropriate for Environment  Eye Contact: Fair  Speech: Clear and Coherent  Speech Volume: Normal  Mood and Affect  Mood: Depressed  Affect: Congruent   Thought Process  Thought Processes: Coherent  Descriptions of Associations:Intact  Orientation:Full (Time, Place and Person)  Thought Content:Logical  Diagnosis of Schizophrenia or Schizoaffective disorder in past: No   Hallucinations:Hallucinations: Auditory Description of Auditory Hallucinations: Telling me to kill  myself  Ideas of Reference:None  Suicidal Thoughts:Suicidal Thoughts: Yes, Active SI Active Intent and/or Plan: With Plan  Homicidal Thoughts:No data recorded  Sensorium  Memory: Immediate Fair; Recent Fair; Remote Fair  Judgment: Poor  Insight: Present   Executive  Functions  Concentration: Fair  Attention Span: Fair  Recall: [employer] of Knowledge: Fair  Language: Fair   Psychomotor Activity  Psychomotor Activity: Psychomotor Activity: Normal   Assets  Assets: Armed forces logistics/support/administrative officer; Desire for Improvement   Sleep  Sleep: Sleep: Poor   Nutritional Assessment (For OBS and FBC admissions only) Has the patient had a weight loss or gain of 10 pounds or more in the last 3 months?: No Has the patient had a decrease in food intake/or appetite?: No Does the patient have dental problems?: No Does the patient have eating habits or behaviors that may be indicators of an eating disorder including binging or inducing vomiting?: No Has the patient recently lost weight without trying?: 0 Has the patient been eating poorly because of a decreased appetite?: 0 Malnutrition Screening Tool Score: 0    Physical Exam HENT:     Head:     Comments: multiple left anterior facial abrasions    Nose: Nose normal.  Eyes:     Conjunctiva/sclera: Conjunctivae normal.  Cardiovascular:     Rate and Rhythm: Normal rate.  Pulmonary:     Effort: Pulmonary effort is normal.  Musculoskeletal:        General: Normal range of motion.     Cervical back: Normal range of motion.  Neurological:     Mental Status: He is alert and oriented to person, place, and time.    Review of Systems  Constitutional: Negative.   HENT: Negative.    Eyes: Negative.   Respiratory: Negative.    Cardiovascular: Negative.   Gastrointestinal: Negative.   Genitourinary: Negative.   Musculoskeletal:  Positive for neck pain.  Neurological:        "[address] and hit my face this morning"    Blood pressure (!) 143/87, pulse 94, temperature 97.6 F (36.4 C), temperature source Oral, resp. rate 18, height _0  (1.702 m), weight 150 lb (68 kg), SpO2 99 %. Body mass index is 23.49 kg/m.  Past Psychiatric History: history of MDD, bipolar, IED and insomnia.  Is the patient at  risk to self? Yes  Has the patient been a risk to self in the past 6 months? No .    Has the patient been a risk to self within the distant past? Yes   Is the patient a risk to others? No   Has the patient been a risk to others in the past 6 months? No   Has the patient been a risk to others within the distant past? No   Past Medical History:  Past Medical History:  Diagnosis Date   Compression fracture of T12 vertebra ([place]) 2008   Depression    Hypertension    No past surgical history on file.  Family History:  Family History  Problem Relation Age of Onset   Drug abuse Cousin     Social History:  Social History   Socioeconomic History   Marital status: Single    Spouse name: Not on file   Number of children: Not on file   Years of education: Not on file   Highest education level: Not on file  Occupational History   Not on file  Tobacco Use   Smoking status: Every Day  Packs/day: 0.50    Types: Cigarettes   Smokeless tobacco: Never  Substance and Sexual Activity   Alcohol use: No   Drug use: Yes    Types: Cocaine    Comment: last used yesterday   Sexual activity: Yes  Other Topics Concern   Not on file  Social History Narrative   Not on file   Social Determinants of Health   Financial Resource Strain: Not on file  Food Insecurity: Not on file  Transportation Needs: Not on file  Physical Activity: Not on file  Stress: Not on file  Social Connections: Not on file  Intimate Partner Violence: Not on file    SDOH:  SDOH Screenings   Alcohol Screen: Low Risk  (02/25/2017)  Depression (PHQ2-9): High Risk (04/02/2022)  Tobacco Use: High Risk (11/30/2017)    Last Labs:  No visits with results within 6 Month(s) from this visit.  Latest known visit with results is:  Admission on 06/05/2017, Discharged on 06/06/2017  Component Date Value Ref Range Status   Sodium 06/05/2017 139  135 - 145 mmol/L Final   Potassium 06/05/2017 3.2 (L)  3.5 - 5.1 mmol/L Final    Chloride 06/05/2017 107  101 - 111 mmol/L Final   CO2 06/05/2017 21 (L)  22 - 32 mmol/L Final   Glucose, Bld 06/05/2017 128 (H)  65 - 99 mg/dL Final   BUN 06/05/2017 12  6 - 20 mg/dL Final   Creatinine, Ser 06/05/2017 1.31 (H)  0.61 - 1.24 mg/dL Final   Calcium 06/05/2017 9.1  8.9 - 10.3 mg/dL Final   Total Protein 06/05/2017 6.9  6.5 - 8.1 g/dL Final   Albumin 06/05/2017 4.1  3.5 - 5.0 g/dL Final   AST 06/05/2017 31  15 - 41 U/L Final   ALT 06/05/2017 32  17 - 63 U/L Final   Alkaline Phosphatase 06/05/2017 68  38 - 126 U/L Final   Total Bilirubin 06/05/2017 0.6  0.3 - 1.2 mg/dL Final   GFR calc non Af Amer 06/05/2017 >60  >60 mL/min Final   GFR calc Af Amer 06/05/2017 >60  >60 mL/min Final   Comment: (NOTE) The eGFR has been calculated using the CKD EPI equation. This calculation has not been validated in all clinical situations. eGFR's persistently <60 mL/min signify possible Chronic Kidney Disease.    Anion gap 06/05/2017 11  5 - 15 Final   Performed at [facility], [place] [address], [place], [place] [address]   Alcohol, Ethyl (B) 06/05/2017 <10  <10 mg/dL Final   Comment:        LOWEST DETECTABLE LIMIT FOR SERUM ALCOHOL IS 10 mg/dL FOR MEDICAL PURPOSES ONLY Performed at [place] [address] [place], [place] [address]    Salicylate Lvl 98/26/4158 <7.0  2.8 - 30.0 mg/dL Final   Performed at [place] [address], [place], [place] [address]   Acetaminophen (Tylenol), Serum 06/05/2017 <10 (L)  10 - 30 ug/mL Final   Comment:        THERAPEUTIC CONCENTRATIONS VARY SIGNIFICANTLY. A RANGE OF 10-30 ug/mL MAY BE AN EFFECTIVE CONCENTRATION FOR MANY PATIENTS. HOWEVER, SOME ARE BEST TREATED AT CONCENTRATIONS OUTSIDE THIS RANGE. ACETAMINOPHEN CONCENTRATIONS >150 ug/mL AT 4 HOURS AFTER INGESTION AND >50 ug/mL AT 12 HOURS AFTER INGESTION ARE OFTEN ASSOCIATED WITH TOXIC REACTIONS. Performed at [facility], [place] [address] [place], [place] [address]    Opiates 06/05/2017 NONE DETECTED  NONE DETECTED Final  Cocaine 06/05/2017 POSITIVE (A)  NONE DETECTED Final   Benzodiazepines 06/05/2017 NONE DETECTED  NONE DETECTED Final   Amphetamines 06/05/2017 NONE DETECTED  NONE DETECTED Final   Tetrahydrocannabinol 06/05/2017 POSITIVE (A)  NONE DETECTED Final   Barbiturates 06/05/2017 NONE DETECTED  NONE DETECTED Final   Comment: (NOTE) DRUG SCREEN FOR MEDICAL PURPOSES ONLY.  IF CONFIRMATION IS NEEDED FOR ANY PURPOSE, NOTIFY LAB WITHIN 5 DAYS. LOWEST DETECTABLE LIMITS FOR URINE DRUG SCREEN Drug Class                     Cutoff (ng/mL) Amphetamine and metabolites    1000 Barbiturate and metabolites    200 Benzodiazepine                 536 Tricyclics and metabolites     300 Opiates and metabolites        300 Cocaine and metabolites        300 THC                            50 Performed at [facility], [place] [address] [place], [place] [address]    WBC 06/05/2017 13.0 (H)  4.0 - 10.5 K/uL Final   RBC 06/05/2017 4.63  4.22 - 5.81 MIL/uL Final   Hemoglobin 06/05/2017 14.8  13.0 - 17.0 g/dL Final   HCT 06/05/2017 40.9  39.0 - 52.0 % Final   MCV 06/05/2017 88.3  78.0 - 100.0 fL Final   MCH 06/05/2017 32.0  26.0 - 34.0 pg Final   MCHC 06/05/2017 36.2 (H)  30.0 - 36.0 g/dL Final   RDW 06/05/2017 13.2  11.5 - 15.5 % Final   Platelets 06/05/2017 250  150 - 400 K/uL Final   Neutrophils Relative % 06/05/2017 81  % Final   Neutro Abs 06/05/2017 10.5 (H)  1.7 - 7.7 K/uL Final   Lymphocytes Relative 06/05/2017 10  % Final   Lymphs Abs 06/05/2017 1.3  0.7 - 4.0 K/uL Final   Monocytes Relative 06/05/2017 8  % Final   Monocytes Absolute 06/05/2017 1.1 (H)  0.1 - 1.0 K/uL Final   Eosinophils Relative 06/05/2017 1  % Final   Eosinophils Absolute 06/05/2017 0.1  0.0 - 0.7 K/uL Final   Basophils Relative 06/05/2017 0  % Final   Basophils Absolute 06/05/2017 0.0  0.0 -  0.1 K/uL Final   Performed at [facility], [place] [address] [place],  [address]    Allergies: Amoxicillin and Haloperidol and related   Medical Decision Making  Patent admitted to the [facility] continuous assessment unit and is recommended for inpatient psychiatric treatment. CSW to seek appropriate placement. Patient is voluntary.   Lab Orders         Resp panel by RT-PCR (RSV, Flu A&B, Covid) Anterior Nasal Swab         CBC with Differential/Platelet         Comprehensive metabolic panel         Hemoglobin A1c         Ethanol         Lipid panel         TSH         Carbamazepine level, total         POCT Urine Drug Screen - (I-Screen)    EKG   Medications:  Patient's home medications will need to be restarted once he returns from the emergency  department and is readmitted to the [facility].   Recommendations  Based on my evaluation the patient appears to have an emergency medical condition for which I recommend the patient be transferred to the emergency department for further evaluation.  Patient reported falling this morning and hitting his face. Patient is asymptomatic. He is noted to have multiple facial abrasions. Will order head CT. Report called to Dr. Darl Householder at [facility].   Patient may return back to the [facility] to await inpatient psychiatric treatment.   Marissa Calamity, NP 04/02/22  6:50 PM
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
## 2022-04-02 NOTE — ED Notes (Signed)
Report called to Charge Nurse Brittni, [facility].  Safe Cabin crew.

## 2022-04-02 NOTE — Discharge Instructions (Signed)
Transfer to [facility] for head CT
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
## 2022-04-03 ENCOUNTER — Other Ambulatory Visit: Payer: Self-pay

## 2022-04-03 ENCOUNTER — Encounter (HOSPITAL_COMMUNITY): Payer: Self-pay | Admitting: Behavioral Health

## 2022-04-03 ENCOUNTER — Inpatient Hospital Stay (HOSPITAL_COMMUNITY)
Admission: AD | Admit: 2022-04-03 | Discharge: 2022-04-08 | DRG: 885 | Disposition: A | Discharge status: Home / Self Care | Payer: Federal, State, Local not specified - Other | Source: Intra-hospital | Attending: Psychiatry | Admitting: Psychiatry

## 2022-04-03 ENCOUNTER — Ambulatory Visit (HOSPITAL_COMMUNITY)
Admission: EM | Admit: 2022-04-03 | Discharge: 2022-04-03 | Disposition: A | Discharge status: Other Acute Inpatient Hospital | Payer: No Payment, Other | Attending: Behavioral Health | Admitting: Behavioral Health

## 2022-04-03 DIAGNOSIS — F333 Major depressive disorder, recurrent, severe with psychotic symptoms: Secondary | ICD-10-CM | POA: Diagnosis present

## 2022-04-03 DIAGNOSIS — F411 Generalized anxiety disorder: Secondary | ICD-10-CM | POA: Diagnosis present

## 2022-04-03 DIAGNOSIS — E785 Hyperlipidemia, unspecified: Secondary | ICD-10-CM | POA: Diagnosis present

## 2022-04-03 DIAGNOSIS — Z62810 Personal history of physical and sexual abuse in childhood: Secondary | ICD-10-CM | POA: Diagnosis not present

## 2022-04-03 DIAGNOSIS — F6381 Intermittent explosive disorder: Secondary | ICD-10-CM | POA: Diagnosis present

## 2022-04-03 DIAGNOSIS — F332 Major depressive disorder, recurrent severe without psychotic features: Secondary | ICD-10-CM | POA: Diagnosis present

## 2022-04-03 DIAGNOSIS — Z79899 Other long term (current) drug therapy: Secondary | ICD-10-CM

## 2022-04-03 DIAGNOSIS — G4700 Insomnia, unspecified: Secondary | ICD-10-CM | POA: Diagnosis present

## 2022-04-03 DIAGNOSIS — R45851 Suicidal ideations: Secondary | ICD-10-CM | POA: Diagnosis present

## 2022-04-03 DIAGNOSIS — Z87891 Personal history of nicotine dependence: Secondary | ICD-10-CM

## 2022-04-03 DIAGNOSIS — I10 Essential (primary) hypertension: Secondary | ICD-10-CM | POA: Diagnosis present

## 2022-04-03 DIAGNOSIS — Z9151 Personal history of suicidal behavior: Secondary | ICD-10-CM | POA: Diagnosis not present

## 2022-04-03 DIAGNOSIS — Z5900 Homelessness unspecified: Secondary | ICD-10-CM | POA: Diagnosis not present

## 2022-04-03 DIAGNOSIS — Z818 Family history of other mental and behavioral disorders: Secondary | ICD-10-CM

## 2022-04-03 DIAGNOSIS — F1420 Cocaine dependence, uncomplicated: Secondary | ICD-10-CM | POA: Insufficient documentation

## 2022-04-03 DIAGNOSIS — F4010 Social phobia, unspecified: Secondary | ICD-10-CM | POA: Diagnosis present

## 2022-04-03 LAB — CARBAMAZEPINE LEVEL, TOTAL: Carbamazepine Lvl: 6.7 ug/mL (ref 4.0–12.0)

## 2022-04-03 MED ORDER — AMLODIPINE BESYLATE 5 MG PO TABS
5.0000 mg | ORAL_TABLET | Freq: Every day | ORAL | Status: DC
  Administered 2022-04-03: 5 mg via ORAL
  Filled 2022-04-03: qty 1

## 2022-04-03 MED ORDER — MAGNESIUM HYDROXIDE 400 MG/5ML PO SUSP: 30.0000 mL | Freq: Every day | ORAL | Status: DC | PRN

## 2022-04-03 MED ORDER — BUSPIRONE HCL 15 MG PO TABS
15.0000 mg | ORAL_TABLET | Freq: Three times a day (TID) | ORAL | Status: DC
  Administered 2022-04-03: 15 mg via ORAL
  Filled 2022-04-03: qty 1

## 2022-04-03 MED ORDER — NICOTINE 21 MG/24HR TD PT24
21.0000 mg | MEDICATED_PATCH | Freq: Every day | TRANSDERMAL | Status: DC
  Filled 2022-04-03 (×4): qty 1

## 2022-04-03 MED ORDER — GABAPENTIN 300 MG PO CAPS
300.0000 mg | ORAL_CAPSULE | Freq: Three times a day (TID) | ORAL | Status: DC
  Administered 2022-04-03: 300 mg via ORAL
  Filled 2022-04-03: qty 1

## 2022-04-03 MED ORDER — HYDROXYZINE HCL 25 MG PO TABS
25.0000 mg | ORAL_TABLET | Freq: Three times a day (TID) | ORAL | Status: DC | PRN
  Administered 2022-04-03 – 2022-04-08 (×11): 25 mg via ORAL
  Filled 2022-04-03 (×9): qty 1
  Filled 2022-04-03: qty 10
  Filled 2022-04-03 (×3): qty 1

## 2022-04-03 MED ORDER — AMLODIPINE BESYLATE 5 MG PO TABS
5.0000 mg | ORAL_TABLET | Freq: Every day | ORAL | Status: DC
  Administered 2022-04-04 – 2022-04-08 (×5): 5 mg via ORAL
  Filled 2022-04-03 (×6): qty 1

## 2022-04-03 MED ORDER — ALUM & MAG HYDROXIDE-SIMETH 200-200-20 MG/5ML PO SUSP: 30.0000 mL | ORAL | Status: DC | PRN

## 2022-04-03 MED ORDER — IBUPROFEN 600 MG PO TABS
600.0000 mg | ORAL_TABLET | Freq: Four times a day (QID) | ORAL | Status: DC | PRN
  Administered 2022-04-03 – 2022-04-08 (×12): 600 mg via ORAL
  Filled 2022-04-03 (×12): qty 1

## 2022-04-03 MED ORDER — HYDROXYZINE HCL 25 MG PO TABS
25.0000 mg | ORAL_TABLET | Freq: Three times a day (TID) | ORAL | Status: DC | PRN
  Administered 2022-04-03: 25 mg via ORAL
  Filled 2022-04-03: qty 1

## 2022-04-03 MED ORDER — WHITE PETROLATUM EX OINT
TOPICAL_OINTMENT | CUTANEOUS | Status: AC
  Filled 2022-04-03: qty 5

## 2022-04-03 MED ORDER — BUSPIRONE HCL 15 MG PO TABS
15.0000 mg | ORAL_TABLET | Freq: Three times a day (TID) | ORAL | Status: DC
  Administered 2022-04-03 – 2022-04-04 (×3): 15 mg via ORAL
  Filled 2022-04-03 (×9): qty 1

## 2022-04-03 MED ORDER — GABAPENTIN 300 MG PO CAPS
300.0000 mg | ORAL_CAPSULE | Freq: Three times a day (TID) | ORAL | Status: DC
  Administered 2022-04-03 – 2022-04-04 (×3): 300 mg via ORAL
  Filled 2022-04-03 (×9): qty 1

## 2022-04-03 MED ORDER — IBUPROFEN 600 MG PO TABS
600.0000 mg | ORAL_TABLET | Freq: Four times a day (QID) | ORAL | Status: DC | PRN
  Administered 2022-04-03: 600 mg via ORAL
  Filled 2022-04-03: qty 1

## 2022-04-03 MED ORDER — CARBAMAZEPINE ER 100 MG PO TB12
100.0000 mg | ORAL_TABLET | Freq: Two times a day (BID) | ORAL | Status: DC
  Administered 2022-04-03 – 2022-04-04 (×2): 100 mg via ORAL
  Filled 2022-04-03 (×7): qty 1
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
## 2022-04-03 NOTE — [facility] Group Notes (Signed)
Pt did not attend wrap up group this evening. Pt was in their room.  

## 2022-04-03 NOTE — ED Provider Notes (Signed)
FBC/OBS ASAP Discharge Summary  Date and Time: 04/03/2022 3:31 PM  Name: Theodore Carr  MRN:  [id number]   Discharge Diagnoses:  Final diagnoses:  Cocaine use disorder, severe, dependence (HCC)  MDD (major depressive disorder), recurrent severe, without psychosis (HCC)    Subjective: Patient seen and evaluated face-to-face by this provider, and chart reviewed. On evaluation, patient is alert and oriented x 4.  His thought process is linear and speech is clear and coherent at a moderate tone. His mood is depressed and affect is congruent. He is calm and cooperative. He currently denies suicidal ideations but reported feeling suicidal this morning with a plan to hang himself. He denies homicidal ideations. He denies auditory or visual hallucinations. There is no objective evidence that the patient is currently responding to internal or external stimuli. He continues to report feeling depressed and describes his symptoms as feeling hopeless and worthless today. He reports poor sleep due to sitting in the ED all night for at head CT. He reports a fair appetite. He denies physical complaints on exam.    Stay Summary: Theodore Carr is a 36 year old male patient with a past psychiatric history significant for MDD, insomnia, intermittent explosive disorder, and polysubstance abuse who presented to the [facility] behavioral health urgent care voluntary on 04/02/22 unaccompanied with a chief complaint of suicidal ideations with a plan to OD on his medications or hang himself. He was admitted to the [facility] and recommended for inpatient psychiatric treatment.   Total Time spent with patient: 30 minutes  Past Psychiatric History: MDD, insomnia, intermittent explosive disorder, and polysubstance abuse  Past Medical History:  Past Medical History:  Diagnosis Date   Compression fracture of T12 vertebra (HCC) 2008   Depression    Hypertension    No past surgical history on file. Family History:  Family  History  Problem Relation Age of Onset   Drug abuse Cousin    Family Psychiatric History: No history reported.  Social History:  Social History   Substance and Sexual Activity  Alcohol Use No     Social History   Substance and Sexual Activity  Drug Use Yes   Types: Cocaine   Comment: last used yesterday    Social History   Socioeconomic History   Marital status: Single    Spouse name: Not on file   Number of children: Not on file   Years of education: Not on file   Highest education level: Not on file  Occupational History   Not on file  Tobacco Use   Smoking status: Every Day    Packs/day: 0.50    Types: Cigarettes   Smokeless tobacco: Never  Substance and Sexual Activity   Alcohol use: No   Drug use: Yes    Types: Cocaine    Comment: last used yesterday   Sexual activity: Yes  Other Topics Concern   Not on file  Social History Narrative   Not on file   Social Determinants of Health   Financial Resource Strain: Not on file  Food Insecurity: Not on file  Transportation Needs: Not on file  Physical Activity: Not on file  Stress: Not on file  Social Connections: Not on file   SDOH:  SDOH Screenings   Alcohol Screen: Low Risk  (02/25/2017)  Depression (PHQ2-9): High Risk (04/02/2022)  Tobacco Use: High Risk (11/30/2017)    Tobacco Cessation:  N/A, patient does not currently use tobacco products  Current Medications:  No current facility-administered medications for this encounter.  No current outpatient medications on file.   Facility-Administered Medications Ordered in Other Encounters  Medication Dose Route Frequency Provider Last Rate Last Admin   alum & mag hydroxide-simeth (MAALOX/MYLANTA) 200-200-20 MG/5ML suspension 30 mL  30 mL Oral Q4H PRN Andrus Sharp L, NP       [START ON 04/04/2022] amLODipine (NORVASC) tablet 5 mg  5 mg Oral Daily Rosangelica Pevehouse L, NP       busPIRone (BUSPAR) tablet 15 mg  15 mg Oral TID Kaleo Condrey L, NP        carbamazepine (TEGRETOL XR) 12 hr tablet 100 mg  100 mg Oral BID Sarinah Doetsch L, NP       gabapentin (NEURONTIN) capsule 300 mg  300 mg Oral TID Kerina Simoneau L, NP       hydrOXYzine (ATARAX) tablet 25 mg  25 mg Oral TID PRN Anica Alcaraz L, NP       ibuprofen (ADVIL) tablet 600 mg  600 mg Oral Q6H PRN Novalynn Branaman L, NP       magnesium hydroxide (MILK OF MAGNESIA) suspension 30 mL  30 mL Oral Daily PRN Hanley Rispoli L, NP        PTA Medications: (Not in a hospital admission)      04/02/2022    6:33 PM 02/11/2015   10:00 AM  Depression screen PHQ 2/9  Decreased Interest 1 0  Down, Depressed, Hopeless 2 1  PHQ - 2 Score 3 1  Altered sleeping 1   Tired, decreased energy 1   Change in appetite 1   Feeling bad or failure about yourself  2   Trouble concentrating 1   Moving slowly or fidgety/restless 0   Suicidal thoughts 2   PHQ-9 Score 11   Difficult doing work/chores Very difficult     Flowsheet Row ED from 04/02/2022 in [facility] EMERGENCY DEPARTMENT Most recent reading at 04/02/2022  9:28 PM ED from 04/02/2022 in [facility] Most recent reading at 04/02/2022  6:33 PM  C-SSRS RISK CATEGORY No Risk High Risk       Musculoskeletal  Strength & Muscle Tone: within normal limits Gait & Station: normal Patient leans: N/A  Psychiatric Specialty Exam  Presentation  General Appearance:  Appropriate for Environment  Eye Contact: Fair  Speech: Clear and Coherent  Speech Volume: Normal  Handedness:No data recorded  Mood and Affect  Mood: Depressed  Affect: Congruent   Thought Process  Thought Processes: Coherent  Descriptions of Associations:Intact  Orientation:Full (Time, Place and Person)  Thought Content:Logical  Diagnosis of Schizophrenia or Schizoaffective disorder in past: No    Hallucinations:Hallucinations: None Description of Auditory Hallucinations: Telling me to kill myself  Ideas of  Reference:None  Suicidal Thoughts:Suicidal Thoughts: Yes, Active SI Active Intent and/or Plan: With Plan  Homicidal Thoughts:Homicidal Thoughts: No   Sensorium  Memory: Immediate Fair; Recent Fair; Remote Fair  Judgment: Intact  Insight: Present   Executive Functions  Concentration: Fair  Attention Span: Fair  Recall: [employer] of Knowledge: Fair  Language: Fair   Psychomotor Activity  Psychomotor Activity: Psychomotor Activity: Normal   Assets  Assets: Manufacturing systems engineer; Desire for Improvement   Sleep  Sleep: Sleep: Poor   Nutritional Assessment (For OBS and FBC admissions only) Has the patient had a weight loss or gain of 10 pounds or more in the last 3 months?: No Has the patient had a decrease in food intake/or appetite?: No Does the patient have dental problems?: No  Does the patient have eating habits or behaviors that may be indicators of an eating disorder including binging or inducing vomiting?: No Has the patient recently lost weight without trying?: 0 Has the patient been eating poorly because of a decreased appetite?: 0 Malnutrition Screening Tool Score: 0    Physical Exam  Physical Exam HENT:     Head:     Comments: Facial abrasions from recent fall     Nose: Nose normal.  Eyes:     Conjunctiva/sclera: Conjunctivae normal.  Cardiovascular:     Rate and Rhythm: Normal rate.  Pulmonary:     Effort: Pulmonary effort is normal.  Musculoskeletal:        General: Normal range of motion.     Cervical back: Normal range of motion.  Neurological:     Mental Status: He is alert and oriented to person, place, and time.    Review of Systems  Constitutional: Negative.   HENT: Negative.    Eyes: Negative.   Respiratory: Negative.    Cardiovascular: Negative.   Gastrointestinal: Negative.   Genitourinary: Negative.   Musculoskeletal: Negative.   Neurological: Negative.    Blood pressure 132/89, pulse 87, temperature 98.3 F  (36.8 C), resp. rate 18, SpO2 95 %. There is no height or weight on file to calculate BMI.   Plan Of Care/Follow-up recommendations:  Activity:  as tolerated   Disposition: Patient recommended for inpatient psychiatric treatment. Patient accepted to [facility] today, accepting MD is Dr. Sherron Flemings. Patient is voluntary. EMTALA completed. Admission orders place for [facility].    Layla Barter, NP 04/03/2022, 3:31 PM

## 2022-04-03 NOTE — ED Notes (Signed)
Pt admitted to [facility] after returning from [facility] for head CT. Patient cooperative with admission. Reports he continues to have SI with intermittent auditory hallucinations of a ''voice that tells me a I'm no good, I'm awful, to kill myself. '' Pt reports no sleep last night at ED while getting treatment and requests pain medication for head and neck after injury.  NP notified and awaiting orders.  Pt skin assessment revealed mark to left side of head and left eyebrow , pt reports this is from recent fall when he hit head on rocks.  Pt given juice, muffin . Pt is safe, cooperative with admit process. WIll con't to monitor.

## 2022-04-03 NOTE — [facility] Group Notes (Signed)
Adult Psychoeducational Group  Date:  04/03/2022 Time:  1100-1200  Group Topic/Focus: Continuation of the group from Saturday. Looking at the lists that were created and talking about what needs to be done with the homework of 30 positives about themselves.                                     Talking about taking their power back and helping themselves to develop a positive self esteem.      Participation Quality:  did not attend  Jaeleen Inzunza A   

## 2022-04-03 NOTE — Discharge Instructions (Signed)
Transfer to [facility] 

## 2022-04-03 NOTE — ED Notes (Signed)
Report called to Elige Radon, at [facility]. Pt vol consent has been faxed. Will call [facility] transport at 1300 per request of [facility].

## 2022-04-03 NOTE — ED Notes (Signed)
Pt has been pleasant, cooperative. Snack and coffee given. Pt parole officer here to meet with patient. Pt allowed to meet in private with P.O.

## 2022-04-03 NOTE — ED Provider Notes (Signed)
[facility] EMERGENCY DEPARTMENT Provider Note   CSN: [id number] Arrival date & time: 04/02/22  2021     History  Chief Complaint  Patient presents with   Fall/Head injury    Theodore Carr is a 36 y.o. male.  36 yo M with a cc of a fall.  The patient tells me that [place] train [place] and she ended up tripping on the rocks and fell onto the left side of his head.  He was actually seen at behavioral health urgent care and they were concerned about his head injury and sent him here for CT imaging.          Home Medications Prior to Admission medications   Medication Sig Start Date End Date Taking? Authorizing Provider  acetaminophen (TYLENOL) 500 MG tablet Take 500 mg by mouth every 6 (six) hours as needed for moderate pain.   Yes [provider]  amLODipine (NORVASC) 5 MG tablet Take 5 mg by mouth daily.   Yes [provider]  busPIRone (BUSPAR) 15 MG tablet Take 1 tablet (15 mg total) by mouth 3 (three) times daily. For anxiety Patient taking differently: Take 15-30 mg by mouth 3 (three) times daily. Take 15 mg by mouth in the morning, then take 30 mg in the evening per patient 09/01/16  Yes Armandina Stammer I, NP  carbamazepine (TEGRETOL-XR) 100 MG 12 hr tablet Take 100-200 mg by mouth See admin instructions. Take 100 mg by mouth in the morning, then take 200 mg by mouth in the afternoon per patient   Yes [provider]  fenofibrate (TRICOR) 145 MG tablet Take 145 mg by mouth daily.   Yes [provider]  gabapentin (NEURONTIN) 600 MG tablet Take 600 mg by mouth 3 (three) times daily.   Yes [provider]  buPROPion (WELLBUTRIN XL) 150 MG 24 hr tablet Take 1 tablet (150 mg total) by mouth daily. For depression Patient not taking: Reported on 04/02/2022 09/02/16   Armandina Stammer I, NP  carbamazepine (TEGRETOL) 200 MG tablet Take 1 tablet (200 mg total) by mouth 3 (three) times daily. For mood  stabilization Patient not taking: Reported on 06/06/2017 09/01/16   Armandina Stammer I, NP  cloNIDine (CATAPRES) 0.1 MG tablet Take 1 tablet (0.1 mg total) by mouth at bedtime. For high blood pressure Patient not taking: Reported on 06/06/2017 09/01/16   Armandina Stammer I, NP  doxycycline (VIBRAMYCIN) 100 MG capsule Take 1 capsule (100 mg total) by mouth 2 (two) times daily. Patient not taking: Reported on 04/02/2022 06/06/17   Dione Booze, MD  gabapentin (NEURONTIN) 300 MG capsule Take 1 capsule (300 mg total) by mouth 4 (four) times daily - after meals and at bedtime. For agitation Patient not taking: Reported on 06/06/2017 09/01/16   Armandina Stammer I, NP  hydrOXYzine (ATARAX/VISTARIL) 25 MG tablet Take 1 tablet (25 mg total) by mouth every 6 (six) hours as needed for anxiety. Patient not taking: Reported on 06/06/2017 09/01/16   Armandina Stammer I, NP  QUEtiapine (SEROQUEL) 50 MG tablet Take 1 tablet (50 mg total) by mouth at bedtime. For mood control Patient not taking: Reported on 06/06/2017 09/01/16   Armandina Stammer I, NP      Allergies    Amoxicillin and Haloperidol and related    Review of Systems   Review of Systems  Physical Exam Updated Vital Signs BP 127/83   Pulse 96   Temp 98.6 F (37 C)   Resp  16   SpO2 98%  Physical Exam Vitals and nursing note reviewed.  Constitutional:      Appearance: He is well-developed.  HENT:     Head: Normocephalic.     Comments: Abrasion to the left side of the face.  Extraocular motion intact. Eyes:     Pupils: Pupils are equal, round, and reactive to light.  Neck:     Vascular: No JVD.  Cardiovascular:     Rate and Rhythm: Normal rate and regular rhythm.     Heart sounds: No murmur heard.    No friction rub. No gallop.  Pulmonary:     Effort: No respiratory distress.     Breath sounds: No wheezing.  Abdominal:     General: There is no distension.     Tenderness: There is no abdominal tenderness. There is no guarding or rebound.  Musculoskeletal:         General: Normal range of motion.     Cervical back: Normal range of motion and neck supple.  Skin:    Coloration: Skin is not pale.     Findings: No rash.  Neurological:     Mental Status: He is alert and oriented to person, place, and time.  Psychiatric:        Behavior: Behavior normal.     ED Results / Procedures / Treatments   Labs (all labs ordered are listed, but only abnormal results are displayed) Labs Reviewed - No data to display  EKG None  Radiology CT Head Wo Contrast  Result Date: 04/03/2022 CLINICAL DATA:  Ground level fall with face trauma EXAM: CT HEAD WITHOUT CONTRAST CT MAXILLOFACIAL WITHOUT CONTRAST TECHNIQUE: Multidetector CT imaging of the head and maxillofacial structures were performed using the standard protocol without intravenous contrast. Multiplanar CT image reconstructions of the maxillofacial structures were also generated. RADIATION DOSE REDUCTION: This exam was performed according to the departmental dose-optimization program which includes automated exposure control, adjustment of the mA and/or kV according to patient size and/or use of iterative reconstruction technique. COMPARISON:  None Available. FINDINGS: CT HEAD FINDINGS Brain: No intracranial hemorrhage, mass effect, or evidence of acute infarct. No hydrocephalus. No extra-axial fluid collection. Vascular: No hyperdense vessel or unexpected calcification. Skull: No fracture or focal lesion. Other: None. CT MAXILLOFACIAL FINDINGS Osseous: No acute fracture or mandibular dislocation. Remote left nasal bone fracture similar to 06/05/2017. Orbits: Negative. No traumatic or inflammatory finding. Sinuses: Leftward nasal septal deviation has slightly increased from 2019. Bilateral concha bullosa with frothy secretions in the right concha bullosa. Near-complete opacification of both maxillary sinuses. Frothy secretions and mucosal thickening in the ethmoid air cells. Sphenoid sinuses and mastoid air cells  are well aerated. Soft tissues: Negative. IMPRESSION: 1. No acute intracranial abnormality. 2. No facial fracture. 3. Paranasal sinus disease as described greatest in the maxillary sinuses. Electronically Signed   By: Minerva Fester M.D.   On: 04/03/2022 00:07   CT Maxillofacial Wo Contrast  Result Date: 04/03/2022 CLINICAL DATA:  Ground level fall with face trauma EXAM: CT HEAD WITHOUT CONTRAST CT MAXILLOFACIAL WITHOUT CONTRAST TECHNIQUE: Multidetector CT imaging of the head and maxillofacial structures were performed using the standard protocol without intravenous contrast. Multiplanar CT image reconstructions of the maxillofacial structures were also generated. RADIATION DOSE REDUCTION: This exam was performed according to the departmental dose-optimization program which includes automated exposure control, adjustment of the mA and/or kV according to patient size and/or use of iterative reconstruction technique. COMPARISON:  None Available. FINDINGS: CT HEAD FINDINGS Brain:  No intracranial hemorrhage, mass effect, or evidence of acute infarct. No hydrocephalus. No extra-axial fluid collection. Vascular: No hyperdense vessel or unexpected calcification. Skull: No fracture or focal lesion. Other: None. CT MAXILLOFACIAL FINDINGS Osseous: No acute fracture or mandibular dislocation. Remote left nasal bone fracture similar to 06/05/2017. Orbits: Negative. No traumatic or inflammatory finding. Sinuses: Leftward nasal septal deviation has slightly increased from 2019. Bilateral concha bullosa with frothy secretions in the right concha bullosa. Near-complete opacification of both maxillary sinuses. Frothy secretions and mucosal thickening in the ethmoid air cells. Sphenoid sinuses and mastoid air cells are well aerated. Soft tissues: Negative. IMPRESSION: 1. No acute intracranial abnormality. 2. No facial fracture. 3. Paranasal sinus disease as described greatest in the maxillary sinuses. Electronically Signed    By: Minerva Fester M.D.   On: 04/03/2022 00:07    Procedures Procedures    Medications Ordered in ED Medications - No data to display  ED Course/ Medical Decision Making/ A&P                           Medical Decision Making  36 yo M with a cc of closed head injury.  The patient was walking and he lost his balance on some rocks and fell and struck the left side of his face.  Tentatively he is [place] head CT rules negative he was sent here from behavioral health urgent care for CT imaging.  This was ordered through the [facility] process.  CT of the head face and C-spine independently interpreted by me without intracranial hemorrhage or fracture.  Will discharge him back to the behavioral health urgent care.  2:14 AM:  I have discussed the diagnosis/risks/treatment options with the patient.  Evaluation and diagnostic testing in the emergency department does not suggest an emergent condition requiring admission or immediate intervention beyond what has been performed at this time.  They will follow up with PCP. We also discussed returning to the ED immediately if new or worsening sx occur. We discussed the sx which are most concerning (e.g., sudden worsening pain, fever, inability to tolerate by mouth) that necessitate immediate return. Medications administered to the patient during their visit and any new prescriptions provided to the patient are listed below.  Medications given during this visit Medications - No data to display   The patient appears reasonably screen and/or stabilized for discharge and I doubt any other medical condition or other [facility] requiring further screening, evaluation, or treatment in the ED at this time prior to discharge.          Final Clinical Impression(s) / ED Diagnoses Final diagnoses:  Closed head injury, initial encounter    Rx / DC Orders ED Discharge Orders     None         Melene Plan, DO 04/03/22 0214

## 2022-04-03 NOTE — Progress Notes (Signed)
Locker 46 has brown blanket, white sneakers, mug, papers, book, multivitamin, some empty botltes of med, bottle of fenofibrate 145 mg, tylenol, norvasc, buspar, vit D.

## 2022-04-03 NOTE — Tx Team (Signed)
Initial Treatment Plan 04/03/2022 8:23 PM Jones Bales SAY:[id number]    PATIENT STRESSORS: Financial difficulties   Occupational concerns   Substance abuse   Traumatic event     PATIENT STRENGTHS: Ability for insight  Average or above average intelligence  Capable of independent living  Communication skills  General fund of knowledge  Motivation for treatment/growth  Physical Health    PATIENT IDENTIFIED PROBLEMS: "Stress"  "Anxiety"  "Depression"  "Self doubt"  "SI"  "Substance abuse"           DISCHARGE CRITERIA:  Ability to meet basic life and health needs Adequate post-discharge living arrangements Improved stabilization in mood, thinking, and/or behavior Medical problems require only outpatient monitoring Motivation to continue treatment in a less acute level of care Need for constant or close observation no longer present Reduction of life-threatening or endangering symptoms to within safe limits Safe-care adequate arrangements made Verbal commitment to aftercare and medication compliance Withdrawal symptoms are absent or subacute and managed without 24-hour nursing intervention  PRELIMINARY DISCHARGE PLAN: Attend PHP/IOP Attend 12-step recovery group Outpatient therapy Placement in alternative living arrangements  PATIENT/FAMILY INVOLVEMENT: This treatment plan has been presented to and reviewed with the patient, Theodore Carr, and/or family member,   .  The patient and family have been given the opportunity to ask questions and make suggestions.  Theodore Carr, [place] 04/03/2022, 8:23 PM

## 2022-04-03 NOTE — Plan of Care (Signed)
Nurse discussed anxiety, depression and coping skills with patient.  

## 2022-04-04 ENCOUNTER — Encounter (HOSPITAL_COMMUNITY): Payer: Self-pay

## 2022-04-04 DIAGNOSIS — F333 Major depressive disorder, recurrent, severe with psychotic symptoms: Secondary | ICD-10-CM | POA: Diagnosis not present

## 2022-04-04 LAB — HEMOGLOBIN A1C
Hgb A1c MFr Bld: 5.3 % (ref 4.8–5.6)
Mean Plasma Glucose: 105 mg/dL

## 2022-04-04 LAB — GLUCOSE, CAPILLARY: Glucose-Capillary: 88 mg/dL (ref 70–99)

## 2022-04-04 MED ORDER — TRAZODONE HCL 50 MG PO TABS
50.0000 mg | ORAL_TABLET | Freq: Every evening | ORAL | Status: DC | PRN
  Administered 2022-04-04 – 2022-04-07 (×4): 50 mg via ORAL
  Filled 2022-04-04 (×4): qty 1
  Filled 2022-04-04: qty 10

## 2022-04-04 MED ORDER — GABAPENTIN 400 MG PO CAPS
400.0000 mg | ORAL_CAPSULE | Freq: Three times a day (TID) | ORAL | Status: AC
  Administered 2022-04-04 – 2022-04-05 (×4): 400 mg via ORAL
  Filled 2022-04-04 (×5): qty 1

## 2022-04-04 MED ORDER — BUPROPION HCL ER (XL) 150 MG PO TB24
150.0000 mg | ORAL_TABLET | Freq: Every day | ORAL | Status: DC
  Administered 2022-04-04 – 2022-04-08 (×5): 150 mg via ORAL
  Filled 2022-04-04 (×7): qty 1

## 2022-04-04 MED ORDER — FENOFIBRATE 160 MG PO TABS
160.0000 mg | ORAL_TABLET | Freq: Every day | ORAL | Status: DC
  Administered 2022-04-04 – 2022-04-08 (×5): 160 mg via ORAL
  Filled 2022-04-04 (×7): qty 1

## 2022-04-04 NOTE — Group Note (Signed)
Recreation Therapy Group Note   Group Topic:Other  Group Date: 04/04/2022 Start Time: 0935 End Time: 1010 Facilitators: Darcie Mellone-McCall, LRT,CTRS Location: [address] Dayroom   Goal Area(s) Addresses:  Patient will participate in the creative process to complete all crafts.  Patient will interact pro-socially with staff and peers. Patient will share traditions, activities, and positive feelings produced during the holidays.   Group Description: LRT facilitated a therapeutic art activity to encourage self-expression and creativity in recognition of the approaching holidays. Writer explained that the folded paper icicles and chalk drawings created in session will be used to decorate the unit to boost mood and create a positive atmosphere. Patients were encouraged to engage in leisure discussions about festive activities and hobbies they like to participate in during the winter season.   Affect/Mood: N/A   Participation Level: Did not attend    Clinical Observations/Individualized Feedback:       Plan: Continue to engage patient in RT group sessions 2-3x/week.   Theodore Carr, LRT,CTRS 04/04/2022 11:52 AM

## 2022-04-04 NOTE — Plan of Care (Signed)
  Problem: Education: Goal: Verbalization of understanding the information provided will improve Outcome: Progressing   Problem: Coping: Goal: Ability to demonstrate self-control will improve Outcome: Progressing   Problem: Health Behavior/Discharge Planning: Goal: Identification of resources available to assist in meeting health care needs will improve Outcome: Progressing   Problem: Safety: Goal: Periods of time without injury will increase Outcome: Progressing   Problem: Education: Goal: Ability to make informed decisions regarding treatment will improve Outcome: Progressing   Problem: Coping: Goal: Coping ability will improve Outcome: Progressing   Problem: Medication: Goal: Compliance with prescribed medication regimen will improve Outcome: Progressing   Problem: Health Behavior/Discharge Planning: Goal: Ability to make decisions will improve Outcome: Progressing   Problem: Self-Concept: Goal: Level of anxiety will decrease Outcome: Progressing   Problem: Health Behavior/Discharge Planning: Goal: Ability to identify changes in lifestyle to reduce recurrence of condition will improve Outcome: Progressing Goal: Identification of resources available to assist in meeting health care needs will improve Outcome: Progressing   Problem: Safety: Goal: Ability to remain free from injury will improve Outcome: Progressing
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
## 2022-04-04 NOTE — BHH Group Notes (Signed)
Spiritual care group on grief and loss facilitated by Chaplain Katy Jamar Casagrande, BCC, and Chaplain Genesis Adams   Group Goal:  Support / Education around grief and loss  Members engage in facilitated group support and psycho-social education.  Group Description:  Following introductions and group rules, group members engaged in facilitated group dialog and support around topic of loss, with particular support around experiences of loss in their lives. Group Identified types of loss (relationships / self / things) and identified patterns, circumstances, and changes that precipitate losses. Reflected on thoughts / feelings around loss, normalized grief responses, and recognized variety in grief experience.  Group drew on Adlerian / Rogerian, narrative, MI,  Patient Progress: Did not attend.  

## 2022-04-04 NOTE — BHH Group Notes (Signed)
Adult Psychoeducational Group Note  Date:  04/04/2022 Time:  9:23 AM  Group Topic/Focus:  Emotional Education:   The focus of this group is to discuss what feelings/emotions are, and how they are experienced. Goals Group:   The focus of this group is to help patients establish daily goals to achieve during treatment and discuss how the patient can incorporate goal setting into their daily lives to aide in recovery. Healthy Communication:   The focus of this group is to discuss communication, barriers to communication, as well as healthy ways to communicate with others.  Participation Level:  Active  Participation Quality:  Appropriate and Attentive  Affect:  Appropriate  Cognitive:  Alert and Appropriate  Insight: Appropriate and Good  Engagement in Group:  Developing/Improving and Engaged  Modes of Intervention:  Discussion, Education, and Exploration  Additional Comments: N/A  Undrea Archbold R  RN 

## 2022-04-04 NOTE — Progress Notes (Signed)
   04/03/22 2200  Psych Admission Type (Psych Patients Only)  Admission Status Voluntary  Psychosocial Assessment  Patient Complaints Anxiety  Eye Contact Fair  Facial Expression Anxious  Affect Anxious;Depressed  Speech Logical/coherent  Interaction Cautious;Guarded  Motor Activity Other (Comment)  Appearance/Hygiene Unremarkable  Behavior Characteristics Cooperative;Appropriate to situation  Mood Depressed;Anxious  Thought Process  Coherency WDL  Content WDL  Delusions None reported or observed  Perception WDL  Hallucination None reported or observed  Judgment WDL  Confusion None  Danger to Self  Current suicidal ideation? Denies  Description of Suicide Plan Contracts verbally for safety  Self-Injurious Behavior No self-injurious ideation or behavior indicators observed or expressed   Agreement Not to Harm Self Yes  Description of Agreement verbally contracts for safety  Danger to Others  Danger to Others None reported or observed   Alert/orieted x 3. Pleasant cooperative. Reports plan to participate in plan of care to include going to group and implementing steps in overall plan of care to reach goals. Denies SI/HI, A/V hallucinations. Requested anti-anxiety med, given with effectiveness. No c/o pain/discomfort. Will continue to monitor. Verbally contracted for safety.
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
## 2022-04-04 NOTE — BH IP Treatment Plan (Signed)
Interdisciplinary Treatment and Diagnostic Plan Update  04/04/2022 Time of Session: 9:45am Bedford Winsor MRN: [id number]  Principal Diagnosis: MDD (major depressive disorder), recurrent, severe, with psychosis (HCC)  Secondary Diagnoses: Principal Problem:   MDD (major depressive disorder), recurrent, severe, with psychosis (HCC)   Current Medications:  Current Facility-Administered Medications  Medication Dose Route Frequency Provider Last Rate Last Admin   alum & mag hydroxide-simeth (MAALOX/MYLANTA) 200-200-20 MG/5ML suspension 30 mL  30 mL Oral Q4H PRN White, Patrice L, NP       amLODipine (NORVASC) tablet 5 mg  5 mg Oral Daily White, Patrice L, NP   5 mg at 04/04/22 0749   busPIRone (BUSPAR) tablet 15 mg  15 mg Oral TID White, Patrice L, NP   15 mg at 04/04/22 0749   carbamazepine (TEGRETOL XR) 12 hr tablet 100 mg  100 mg Oral BID White, Patrice L, NP   100 mg at 04/04/22 0750   gabapentin (NEURONTIN) capsule 300 mg  300 mg Oral TID Liborio Nixon L, NP   300 mg at 04/04/22 0749   hydrOXYzine (ATARAX) tablet 25 mg  25 mg Oral TID PRN Liborio Nixon L, NP   25 mg at 04/04/22 0749   ibuprofen (ADVIL) tablet 600 mg  600 mg Oral Q6H PRN White, Patrice L, NP   600 mg at 04/04/22 0750   magnesium hydroxide (MILK OF MAGNESIA) suspension 30 mL  30 mL Oral Daily PRN White, Patrice L, NP       nicotine (NICODERM CQ - dosed in mg/24 hours) patch 21 mg  21 mg Transdermal Daily Massengill, Nathan, MD       PTA Medications: Medications Prior to Admission  Medication Sig Dispense Refill Last Dose   amLODipine (NORVASC) 5 MG tablet Take 5 mg by mouth daily.      busPIRone (BUSPAR) 15 MG tablet Take 1 tablet (15 mg total) by mouth 3 (three) times daily. For anxiety (Patient taking differently: Take 15-30 mg by mouth 3 (three) times daily. Take 15 mg by mouth in the morning, then take 30 mg in the evening per patient) 90 tablet 0    carbamazepine (TEGRETOL-XR) 100 MG 12 hr tablet Take 100-200 mg by  mouth See admin instructions. Take 100 mg by mouth in the morning, then take 200 mg by mouth in the afternoon per patient      fenofibrate (TRICOR) 145 MG tablet Take 145 mg by mouth daily.      gabapentin (NEURONTIN) 600 MG tablet Take 600 mg by mouth 3 (three) times daily.      hydrOXYzine (ATARAX/VISTARIL) 25 MG tablet Take 1 tablet (25 mg total) by mouth every 6 (six) hours as needed for anxiety. (Patient not taking: Reported on 06/06/2017) 120 tablet 0     Patient Stressors: Financial difficulties   Occupational concerns   Substance abuse   Traumatic event    Patient Strengths: Ability for insight  Average or above average intelligence  Capable of independent living  Communication skills  General fund of knowledge  Motivation for treatment/growth  Physical Health   Treatment Modalities: Medication Management, Group therapy, Case management,  1 to 1 session with clinician, Psychoeducation, Recreational therapy.   Physician Treatment Plan for Primary Diagnosis: MDD (major depressive disorder), recurrent, severe, with psychosis (HCC) Long Term Goal(s):     Short Term Goals:    Medication Management: Evaluate patient's response, side effects, and tolerance of medication regimen.  Therapeutic Interventions: 1 to 1 sessions, Unit Group sessions and Medication  administration.  Evaluation of Outcomes: Progressing  Physician Treatment Plan for Secondary Diagnosis: Principal Problem:   MDD (major depressive disorder), recurrent, severe, with psychosis (HCC)  Long Term Goal(s):     Short Term Goals:       Medication Management: Evaluate patient's response, side effects, and tolerance of medication regimen.  Therapeutic Interventions: 1 to 1 sessions, Unit Group sessions and Medication administration.  Evaluation of Outcomes: Progressing   RN Treatment Plan for Primary Diagnosis: MDD (major depressive disorder), recurrent, severe, with psychosis (HCC) Long Term Goal(s):  Knowledge of disease and therapeutic regimen to maintain health will improve  Short Term Goals: Ability to remain free from injury will improve, Ability to verbalize frustration and anger appropriately will improve, Ability to demonstrate self-control, Ability to participate in decision making will improve, Ability to verbalize feelings will improve, Ability to disclose and discuss suicidal ideas, Ability to identify and develop effective coping behaviors will improve, and Compliance with prescribed medications will improve  Medication Management: RN will administer medications as ordered by provider, will assess and evaluate patient's response and provide education to patient for prescribed medication. RN will report any adverse and/or side effects to prescribing provider.  Therapeutic Interventions: 1 on 1 counseling sessions, Psychoeducation, Medication administration, Evaluate responses to treatment, Monitor vital signs and CBGs as ordered, Perform/monitor CIWA, COWS, AIMS and Fall Risk screenings as ordered, Perform wound care treatments as ordered.  Evaluation of Outcomes: Progressing   LCSW Treatment Plan for Primary Diagnosis: MDD (major depressive disorder), recurrent, severe, with psychosis (HCC) Long Term Goal(s): Safe transition to appropriate next level of care at discharge, Engage patient in therapeutic group addressing interpersonal concerns.  Short Term Goals: Engage patient in aftercare planning with referrals and resources, Increase social support, Increase ability to appropriately verbalize feelings, Increase emotional regulation, Facilitate acceptance of mental health diagnosis and concerns, Facilitate patient progression through stages of change regarding substance use diagnoses and concerns, Identify triggers associated with mental health/substance abuse issues, and Increase skills for wellness and recovery  Therapeutic Interventions: Assess for all discharge needs, 1 to 1 time  with Social worker, Explore available resources and support systems, Assess for adequacy in community support network, Educate family and significant other(s) on suicide prevention, Complete Psychosocial Assessment, Interpersonal group therapy.  Evaluation of Outcomes: Progressing   Progress in Treatment: Attending groups: Yes. Participating in groups: Yes. Taking medication as prescribed: Yes. Toleration medication: Yes. Family/Significant other contact made: No, will contact:  Mrs. Mack Hook [phone] Physiological scientist); Patient understands diagnosis: Yes. Discussing patient identified problems/goals with staff: Yes. Medical problems stabilized or resolved: Yes. Denies suicidal/homicidal ideation: Yes. Issues/concerns per patient self-inventory: No.   New problem(s) identified: No, Describe:  none reported  New Short Term/Long Term Goal(s): medication stabilization, elimination of SI thoughts, development of comprehensive mental wellness plan.    Patient Goals:  "I want to work on my depression and self doubt"  Discharge Plan or Barriers: Patient recently admitted. CSW will continue to follow and assess for appropriate referrals and possible discharge planning.    Reason for Continuation of Hospitalization: Anxiety Depression Medication stabilization Suicidal ideation  Estimated Length of Stay: 5-10 days  Last 3 [place] Suicide Severity Risk Score: Flowsheet Row [facility] (Current[facility] Most recent reading at 04/03/2022  8:00 PM ED from 04/02/2022 in [facility] EMERGENCY DEPARTMENT Most recent reading at 04/02/2022  9:28 PM ED from 04/02/2022 in [facility] Most recent reading  at 04/02/2022  6:33 PM  C-SSRS RISK CATEGORY High Risk No Risk High Risk       Last PHQ 2/9 Scores:    04/02/2022    6:33 PM 02/11/2015   10:00 AM  Depression screen PHQ 2/9   Decreased Interest 1 0  Down, Depressed, Hopeless 2 1  PHQ - 2 Score 3 1  Altered sleeping 1   Tired, decreased energy 1   Change in appetite 1   Feeling bad or failure about yourself  2   Trouble concentrating 1   Moving slowly or fidgety/restless 0   Suicidal thoughts 2   PHQ-9 Score 11   Difficult doing work/chores Very difficult     Scribe for Treatment Team: Beatris Si, LCSW 04/04/2022 11:26 AM

## 2022-04-04 NOTE — [facility] Counselor (Signed)
CSW provided the Pt with a packet that contains information including shelter and housing resources, free and reduced price food information, clothing resources, crisis center information, a Good[employer] card, and suicide prevention information.   

## 2022-04-04 NOTE — Progress Notes (Addendum)
D: Pt reported passive SI this morning, no lethal plan expressed at this time. Pt verbally contracts for safety. Pt rated his depression a 10/10, anxiety a 10/10, and feelings of hopelessness a 10/10. Pt reports 8/10 back and head pain related to his injuries acquired from falling prior to being admitted. Pt reports; "I really need to talk to my social worker today, I am feeling very anxious about what the next steps are for me once I am discharged". Pt has been pleasant, calm, and cooperative throughout the shift.   A: RN provided support and encouragement to patient. Pt given scheduled medications as prescribed. PRN Ibuprofen and Hydroxyzine given for pain and anxiety. Q15 min checks verified for safety.    R: Patient verbally contracts for safety. Patient compliant with medications and treatment plan. Patient is interacting well on the unit. Pt is safe on the unit.   04/04/22 1127  Psych Admission Type (Psych Patients Only)  Admission Status Voluntary  Psychosocial Assessment  Patient Complaints Anxiety;Depression;Hopelessness;Worrying  Eye Contact Fair  Facial Expression Sad;Anxious  Affect Depressed;Anxious  Speech Logical/coherent  Interaction Minimal  Motor Activity Other (Comment) (WDL)  Appearance/Hygiene Unremarkable  Behavior Characteristics Cooperative;Appropriate to situation  Mood Depressed;Anxious  Thought Process  Coherency WDL  Content WDL  Delusions None reported or observed  Perception WDL  Hallucination None reported or observed  Judgment Impaired  Confusion None  Danger to Self  Current suicidal ideation? Passive  Description of Suicide Plan No plan  Self-Injurious Behavior Some self-injurious ideation observed or expressed.  No lethal plan expressed   Agreement Not to Harm Self Yes  Description of Agreement Pt verbally contracts for safety  Danger to Others  Danger to Others None reported or observed

## 2022-04-04 NOTE — Progress Notes (Addendum)
Pt rates depression 5/10 and anxiety 6/10. Pt reports a fair appetite, and no physical problems. Patient frequently requesting fluids and stated he is urinating frequently  FSBS checked as a precaution and was 88. Pt denies SI/HI/AVH and verbally contracts for safety. Provided support and encouragement. Pt safe on the unit. Q 15 minute safety checks continued.
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
## 2022-04-04 NOTE — Progress Notes (Signed)
Adult Psychoeducational Group Note  Date:  04/04/2022 Time:  9:40 PM  Group Topic/Focus:  AA Group  Participation Level:  Active  Participation Quality:  Appropriate  Affect:  Appropriate  Cognitive:  Appropriate  Insight: Appropriate  Engagement in Group:  Engaged  Modes of Intervention:  Discussion, Education, and Support  Additional Comments:   Pt attended and participated in the AA/Wrap Up group.  Edmund Hilda Kashton Mcartor 04/04/2022, 9:40 PM

## 2022-04-05 DIAGNOSIS — F333 Major depressive disorder, recurrent, severe with psychotic symptoms: Secondary | ICD-10-CM | POA: Diagnosis not present

## 2022-04-05 LAB — COMPREHENSIVE METABOLIC PANEL
ALT: 39 U/L (ref 0–44)
AST: 44 U/L — ABNORMAL HIGH (ref 15–41)
Albumin: 3.7 g/dL (ref 3.5–5.0)
Alkaline Phosphatase: 46 U/L (ref 38–126)
Anion gap: 7 (ref 5–15)
BUN: 18 mg/dL (ref 6–20)
CO2: 26 mmol/L (ref 22–32)
Calcium: 9.3 mg/dL (ref 8.9–10.3)
Chloride: 109 mmol/L (ref 98–111)
Creatinine, Ser: 1.14 mg/dL (ref 0.61–1.24)
GFR, Estimated: >60 mL/min (ref >60)
Glucose, Bld: 93 mg/dL (ref 70–99)
Potassium: 3.9 mmol/L (ref 3.5–5.1)
Sodium: 142 mmol/L (ref 135–145)
Total Bilirubin: 0.4 mg/dL (ref 0.3–1.2)
Total Protein: 6.7 g/dL (ref 6.5–8.1)

## 2022-04-05 MED ORDER — GABAPENTIN 300 MG PO CAPS
600.0000 mg | ORAL_CAPSULE | Freq: Three times a day (TID) | ORAL | Status: DC
  Administered 2022-04-06 – 2022-04-08 (×7): 600 mg via ORAL
  Filled 2022-04-05 (×11): qty 2

## 2022-04-05 MED ORDER — ARIPIPRAZOLE 5 MG PO TABS
5.0000 mg | ORAL_TABLET | Freq: Every day | ORAL | Status: DC
  Administered 2022-04-05 – 2022-04-07 (×3): 5 mg via ORAL
  Filled 2022-04-05 (×6): qty 1
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
## 2022-04-05 NOTE — Progress Notes (Signed)
Adult Psychoeducational Group Note  Date:  04/05/2022 Time:  3:00 PM  Group Topic/Focus:  Healthy Communication:  Orientation group  Participation Level:  Active  Participation Quality:  Appropriate  Affect:  Appropriate  Cognitive:  Appropriate  Insight: Appropriate  Engagement in Group:  Engaged  Modes of Intervention:  Discussion  Additional Comments:  Pt attended the orientation group and remained appropriate and engaged throughout the duration of the group.   Fara Olden O 04/05/2022, 3:00 PM

## 2022-04-05 NOTE — Progress Notes (Addendum)
D: Pt denied SI/HI/AVH this morning. Pt rated his depression a 5/10, anxiety a 5/10, and feelings of hopelessness a 2/10. Pt reports "I am feeling so much better today. I talked to my parole officer and have figured out a place to go after I leave here so I am feeling optimistic for the future now". Pt reports 8/10 back and head pain related to his injuries from his fall that he acquired prior to being admitted. Pt reports feeling groggy and lightheaded this afternoon. VS are all WNL. Pt has been pleasant, calm, and cooperative throughout the shift.   A: RN provided support and encouragement to patient. PRN Ibuprofen given for back and head pain. Pt given scheduled medications as prescribed. Q15 min checks verified for safety.    R: Patient verbally contracts for safety. Patient compliant with medications and treatment plan. Patient is interacting well on the unit. Pt is safe on the unit.   04/05/22 1000  Psych Admission Type (Psych Patients Only)  Admission Status Voluntary  Psychosocial Assessment  Patient Complaints Anxiety;Depression  Eye Contact Fair  Facial Expression Sad  Affect Depressed;Anxious  Speech Logical/coherent  Interaction Minimal  Motor Activity Other (Comment) (WDL)  Appearance/Hygiene Unremarkable  Behavior Characteristics Cooperative;Appropriate to situation  Mood Depressed;Anxious  Thought Process  Coherency WDL  Content WDL  Delusions None reported or observed  Perception WDL  Hallucination None reported or observed  Judgment Impaired  Confusion None  Danger to Self  Current suicidal ideation? Denies  Self-Injurious Behavior No self-injurious ideation or behavior indicators observed or expressed   Agreement Not to Harm Self Yes  Description of Agreement Pt verbally contracts for safety  Danger to Others  Danger to Others None reported or observed

## 2022-04-05 NOTE — Group Note (Signed)
Recreation Therapy Group Note   Group Topic:Animal Assisted Therapy   Group Date: 04/05/2022 Start Time: 1430 End Time: 1515 Facilitators: Theodore Carr, LRT,CTRS Location: [address] Dayroom   Animal-Assisted Activity (AAA) Program Checklist/Progress Notes Patient Eligibility Criteria Checklist & Daily Group note for Rec Tx Intervention  AAA/T Program Assumption of Risk Form signed by Patient/ or Parent Legal Guardian Yes  Patient is free of allergies or severe asthma Yes  Patient reports no fear of animals Yes  Patient reports no history of cruelty to animals Yes  Patient understands his/her participation is voluntary Yes  Patient washes hands before animal contact Yes  Patient washes hands after animal contact Yes   Affect/Mood: Appropriate   Participation Level: Engaged   Participation Quality: Independent   Behavior: Appropriate    Clinical Observations/Individualized Feedback:  Pt attended and participated in group session.     Plan: Continue to engage patient in RT group sessions 2-3x/week.   Theodore Carr, LRT,CTRS 04/05/2022 4:00 PM

## 2022-04-05 NOTE — Progress Notes (Signed)
Adult Psychoeducational Group Note  Date:  04/05/2022 Time:  8:52 PM  Group Topic/Focus:  Wrap-Up Group:   The focus of this group is to help patients review their daily goal of treatment and discuss progress on daily workbooks.  Participation Level:  Active  Participation Quality:  Appropriate  Affect:  Appropriate  Cognitive:  Alert and Appropriate  Insight: Good  Engagement in Group:  Engaged  Modes of Intervention:  Discussion  Additional Comments:  Demarian said his day was 7. His goal was to get rid violet thoughts.  Charna Busman Long 04/05/2022, 8:52 PM

## 2022-04-05 NOTE — Group Note (Signed)
LCSW Group Therapy Note   Group Date: 04/05/2022 Start Time: 1100 End Time: 1200  Type of Therapy and Topic:  Group Therapy:  Healthy and Unhealthy Supports   Participation Level:  Active   Description of Group:  Patients in this group were introduced to the idea of adding a variety of healthy supports to address the various needs in their lives. Patients discussed what additional healthy supports could be helpful in their recovery and wellness after discharge in order to prevent future hospitalizations.   An emphasis was placed on using counselor, doctor, therapy groups, 12-step groups, and problem-specific support groups to expand supports.  They also worked as a group on developing a specific plan for several patients to deal with unhealthy supports through boundary-setting, psychoeducation with loved ones, and even termination of relationships.   Therapeutic Goals:               1)  discuss importance of adding supports to stay well once out of the hospital             2)  compare healthy versus unhealthy supports and identify some examples of each             3)  generate ideas and descriptions of healthy supports that can be added             4)  offer mutual support about how to address unhealthy supports             5)  encourage active participation in and adherence to discharge plan               Summary of Patient Progress:  The Pt attended group and remained there the entire time.  The Pt accepted all worksheets and materials that were provided.  The Pt was able to identify both healthy and unhealthy supports in their life.  The Pt was appropriate with their peers and demonstrated understanding of the topic being discussed.     Therapeutic Modalities:   Motivational Interviewing Brief Solution-Focused Therapy  Laddie Math M Elysabeth Aust, LCSWA 04/05/2022  1:09 PM    

## 2022-04-06 DIAGNOSIS — F333 Major depressive disorder, recurrent, severe with psychotic symptoms: Secondary | ICD-10-CM | POA: Diagnosis not present

## 2022-04-06 MED ORDER — ONDANSETRON HCL 4 MG PO TABS
4.0000 mg | ORAL_TABLET | Freq: Two times a day (BID) | ORAL | Status: DC | PRN
  Administered 2022-04-07: 4 mg via ORAL
  Filled 2022-04-06: qty 1

## 2022-04-06 NOTE — [facility] Group Notes (Signed)
Patient attended the NA group. ?

## 2022-04-06 NOTE — [facility] Suicide Risk Assessment (Signed)
[facility] INPATIENT:  Family/Significant Other Suicide Prevention Education  Suicide Prevention Education:  Education Completed; Theodore Carr [phone] Physiological scientist) has been identified by the patient as the family member/significant other with whom the patient will be residing, and identified as the person(s) who will aid the patient in the event of a mental health crisis (suicidal ideations/suicide attempt).  With written consent from the patient, the family member/significant other has been provided the following suicide prevention education, prior to the and/or following the discharge of the patient.  The suicide prevention education provided includes the following: Suicide risk factors Suicide prevention and interventions National Suicide Hotline telephone number [facility] assessment telephone number [facility] Emergency Assistance 911 [facility] and/or Residential Mobile Crisis Unit telephone number  Request made of family/significant other to: Remove weapons (e.g., guns, rifles, knives), all items previously/currently identified as safety concern.   Remove drugs/medications (over-the-counter, prescriptions, illicit drugs), all items previously/currently identified as a safety concern.  The family member/significant other verbalizes understanding of the suicide prevention education information provided.  The family member/significant other agrees to remove the items of safety concern listed above.  CSW spoke with Theodore Carr who states that she was planning on speaking with Mr. Magda Paganini at [facility] today about admission for the Pt.  CSW informed Theodore Carr that she had called to check as well and that Mr. Isidoro Donning is out of the office until January 2nd, 2024 and that they are not taking new admissions until that date either.  Theodore Carr stated that she had not called to check on this and was not aware of this information.  She states that she is coming  to [facility] today to have the Pt sign a release to get Transitional Housing.  She states that the Transitional Housing in the [place] area is full but that there was one available in [place].  She states that she would like to find the Pt housing by Friday 04/08/2022 because she will be leaving for Christmas vacation and will not return until 04/19/2022.  Theodore Carr states that she would like the Pt to be on an "ankle monitor for house arrest" after his discharge and would like to do this by 04/08/2022.  Theodore Carr states that she is not aware of the Pt having access to any firearms or other weapons.  CSW completed SPE with Theodore Carr.   Metro Kung Veena Sturgess 04/06/2022, 3:20 PM

## 2022-04-06 NOTE — Group Note (Signed)
Recreation Therapy Group Note   Group Topic:Stress Management  Group Date: 04/06/2022 Start Time: 0935 End Time: 0952 Facilitators: Evren Shankland-McCall, LRT,CTRS Location: [address] Dayroom   Goal Area(s) Addresses:  Patient will actively participate in stress management techniques presented during session.  Patient will successfully identify benefit of practicing stress management post d/c.   Group Description: Guided Imagery. LRT provided education, instruction, and demonstration on practice of visualization via guided imagery. Patient was asked to participate in the technique introduced during session. LRT debriefed including topics of mindfulness, stress management and specific scenarios each patient could use these techniques. Patients were given suggestions of ways to access scripts post d/c and encouraged to explore Youtube and other apps available on smartphones, tablets, and computers.   Affect/Mood: N/A   Participation Level: Did not attend    Clinical Observations/Individualized Feedback:      Plan: Continue to engage patient in RT group sessions 2-3x/week.   Aviannah Castoro-McCall, LRT,CTRS  04/06/2022 12:37 PM
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
## 2022-04-06 NOTE — [facility] Group Notes (Signed)
Patient attended goals group.  

## 2022-04-06 NOTE — Progress Notes (Signed)
   04/05/22 2300  Psych Admission Type (Psych Patients Only)  Admission Status Voluntary  Psychosocial Assessment  Patient Complaints Anxiety;Depression  Eye Contact Fair  Facial Expression Anxious;Sad  Affect Anxious;Depressed  Speech Logical/coherent  Interaction Minimal  Motor Activity Other (Comment) (WNL)  Appearance/Hygiene Unremarkable  Behavior Characteristics Cooperative;Appropriate to situation  Mood Depressed  Thought Process  Coherency WDL  Content WDL  Delusions None reported or observed  Perception WDL  Hallucination None reported or observed  Judgment Impaired  Confusion None  Danger to Self  Current suicidal ideation? Denies  Agreement Not to Harm Self Yes  Description of Agreement verbal  Danger to Others  Danger to Others None reported or observed

## 2022-04-07 DIAGNOSIS — F333 Major depressive disorder, recurrent, severe with psychotic symptoms: Secondary | ICD-10-CM | POA: Diagnosis not present

## 2022-04-07 MED ORDER — ARIPIPRAZOLE 10 MG PO TABS
10.0000 mg | ORAL_TABLET | Freq: Every day | ORAL | Status: DC
  Administered 2022-04-08: 10 mg via ORAL
  Filled 2022-04-07 (×2): qty 1

## 2022-04-07 NOTE — Group Note (Signed)
LCSW Group Therapy Note   Group Date: 04/07/2022 Start Time: 1100 End Time: 1200  Type of Therapy and Topic:  Group Therapy:  Setting Goals   Participation Level:  Active   Description of Group: In this process group, patients discussed using strengths to work toward goals and address challenges.  Patients identified two positive things about themselves and one goal they were working on.  Patients were given the opportunity to share openly and support each other's plan for self-empowerment.  The group discussed the value of gratitude and were encouraged to have a daily reflection of positive characteristics or circumstances.  Patients were encouraged to identify a plan to utilize their strengths to work on current challenges and goals.   Therapeutic Goals Patient will verbalize personal strengths/positive qualities and relate how these can assist with achieving desired personal goals Patients will verbalize affirmation of peers plans for personal change and goal setting Patients will explore the value of gratitude and positive focus as related to successful achievement of goals Patients will verbalize a plan for regular reinforcement of personal positive qualities and circumstances.   Summary of Patient Progress: Patient identified the definition of goals. Patients was given the opportunity to share openly and support other group members' plan for self-empowerment. Patient verbalized personal strength and how they relate to achieving the desired goal. Patient was able to identify positive goals to work towards when they return home.      Therapeutic Modalities Cognitive Behavioral Therapy Motivational Interviewing  Florie Carico M Siris Hoos, LCSWA 04/07/2022  1:43 PM    

## 2022-04-07 NOTE — Progress Notes (Signed)
   04/06/22 2055  Psych Admission Type (Psych Patients Only)  Admission Status Voluntary  Psychosocial Assessment  Patient Complaints Depression;Hopelessness;Sleep disturbance  Eye Contact Fair  Facial Expression Sad  Affect Depressed;Sad  Speech Logical/coherent  Interaction Minimal  Motor Activity Other (Comment) (WDL)  Appearance/Hygiene Unremarkable  Behavior Characteristics Cooperative;Appropriate to situation  Mood Depressed;Sad  Thought Process  Coherency WDL  Content WDL  Delusions None reported or observed  Perception WDL  Hallucination None reported or observed  Judgment Impaired  Confusion None  Danger to Self  Current suicidal ideation? Denies  Description of Suicide Plan No plan  Self-Injurious Behavior No self-injurious ideation or behavior indicators observed or expressed   Agreement Not to Harm Self Yes  Description of Agreement Verbally contracts for safety.  Danger to Others  Danger to Others None reported or observed   Patient alert and oriented. Presenting with a sad, depressed affect and mood. Patient denies SI, HI, AVH, and pain. Patient denies anxiety at this time. Patient endorses depression rated 8/10 on 0-10 scale, with 0 being the least and 10 being the worst. Patient states "I have just been feeling really hopeless because my parole officer wasn't able to get me into two of the housing places she called today, but she has a lot of other places she said she can call, so I'm just trying to have faith." Support and encouragement provided. PRN trazodone administered, per provider orders. Routine safety checks conducted every 15 minutes. Patient verbally contracts for safety and remains safe on unit.

## 2022-04-07 NOTE — Progress Notes (Signed)
Pt remains compliant with medication. Pt complains of headache, anxiety. Pt reports dreaming about being in prison last night. Pt states he has solidified a discharge plan and has help from family paying down payment at a sober living placement. Support and encouragement offered. Q 15 minute checks ongoing.

## 2022-04-07 NOTE — BHH Counselor (Signed)
CSW spoke with Mrs. Mack Hook Physiological scientist) in the [facility] lobby on 04/07/2022 at 11:45am.  She requested that the Pt initial and sign a document to allow him to be placed in transitional housing by the correctional services.  CSW took the document to the Pt who agreed to initial and sign.  After the Pt signed the document CSW returned the document to Mrs. Mack Hook in the lobby.  Mrs. Mack Hook states that "it takes a couple hours to hear anything back and I believe that the housing will likely be in [place], [place]".  Mrs. Mack Hook states that she is "hoping to get him moved by Friday at 12:00pm".  Mrs. Mack Hook will contact the CSW once more information on placement is received.  Mrs. Mack Hook asks that if the Pt does not have housing on Friday 04/08/2022 that he be held at [facility] until 04/12/2022.  CSW stated that she would inform the providers of this request.

## 2022-04-07 NOTE — Progress Notes (Signed)
The patient rated his day as a 9 out of 10 since he found out that he has been accepted into a "recovery house". His positive event for the day is that he made several phone calls.

## 2022-04-07 NOTE — Group Note (Signed)
LCSW Group Therapy Note  Group Date: 04/07/2022 Start Time: 1100 End Time: 1145   Type of Therapy and Topic:  Group Therapy - How To Cope with Nervousness about Discharge   Participation Level:  Active   Description of Group This process group involved identification of patients' feelings about discharge. Some of them are scheduled to be discharged soon, while others are new admissions, but each of them was asked to share thoughts and feelings surrounding discharge from the hospital. One common theme was that they are excited at the prospect of going home, while another was that many of them are apprehensive about sharing why they were hospitalized. Patients were given the opportunity to discuss these feelings with their peers in preparation for discharge.  Therapeutic Goals  Patient will identify their overall feelings about pending discharge. Patient will think about how they might proactively address issues that they believe will once again arise once they get home (i.e. with parents). Patients will participate in discussion about having hope for change.   Summary of Patient Progress:  Marcos was very active throughout the session. Malekai demonstrated good insight into the subject matter, and proved open to input from peers and feedback from CSW. Corderius was respectful of peers and participated throughout the entire session.   Therapeutic Modalities Cognitive Behavioral Therapy   Ane Payment, LCSW 04/07/2022  1:55 PM
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
## 2022-04-07 NOTE — BHH Counselor (Signed)
Per patient request CSW provided the Pt with a list of [facility] at in the [place] area. He states that he would like to go to the [facility] area after discharge and states that his Civil Service fast streamer is working on additional housing resources as well.

## 2022-04-08 DIAGNOSIS — F333 Major depressive disorder, recurrent, severe with psychotic symptoms: Secondary | ICD-10-CM | POA: Diagnosis not present

## 2022-04-08 MED ORDER — ARIPIPRAZOLE 10 MG PO TABS: 10.0000 mg | ORAL_TABLET | Freq: Every day | ORAL | 0 refills | Status: DC

## 2022-04-08 MED ORDER — FENOFIBRATE 160 MG PO TABS: 160.0000 mg | ORAL_TABLET | Freq: Every day | ORAL | 0 refills | Status: DC

## 2022-04-08 MED ORDER — GABAPENTIN 600 MG PO TABS
600.0000 mg | ORAL_TABLET | Freq: Three times a day (TID) | ORAL | Status: DC
  Filled 2022-04-08 (×3): qty 1

## 2022-04-08 MED ORDER — AMLODIPINE BESYLATE 5 MG PO TABS: 5.0000 mg | ORAL_TABLET | Freq: Every day | ORAL | 0 refills | Status: DC

## 2022-04-08 MED ORDER — HYDROXYZINE HCL 25 MG PO TABS: 25.0000 mg | ORAL_TABLET | Freq: Three times a day (TID) | ORAL | 0 refills | Status: DC | PRN

## 2022-04-08 MED ORDER — BUPROPION HCL ER (XL) 150 MG PO TB24: 150.0000 mg | ORAL_TABLET | Freq: Every day | ORAL | 0 refills | Status: DC

## 2022-04-08 MED ORDER — ONDANSETRON HCL 4 MG PO TABS: 4.0000 mg | ORAL_TABLET | Freq: Two times a day (BID) | ORAL | 0 refills | Status: DC | PRN

## 2022-04-08 MED ORDER — GABAPENTIN 300 MG PO CAPS: 600.0000 mg | ORAL_CAPSULE | Freq: Three times a day (TID) | ORAL | 0 refills | Status: DC

## 2022-04-08 MED ORDER — TRAZODONE HCL 50 MG PO TABS: 50.0000 mg | ORAL_TABLET | Freq: Every evening | ORAL | 0 refills | Status: DC | PRN

## 2022-04-08 NOTE — Progress Notes (Signed)
Pt discharged at this time. Pt left facility with parole officer. Pt removed all belongings, valuables, 1 week of medications, prescriptions, and verbalized understanding of discharge instructions and follow up care. Pt requested staff destroy medications brought in upon admission. Pt denies SI/HI/self harm thoughts as well as a/v hallucinaitons.

## 2022-04-08 NOTE — Progress Notes (Signed)

## 2022-04-08 NOTE — BHH Counselor (Signed)
CSW spoke with the Pt who states that he found a place to stay after discharge.  He states that this is a Production [employer], theatre/television/film" that is ran by [employer] at [employer] [address] [address], [place], [place] [address].  He states that he found this housing from a friend and will be staying there until his Civil [employer] fast streamer can arrange different housing for him.  CSW will provide the Pt with a taxi voucher to this address at discharge.

## 2022-04-08 NOTE — Progress Notes (Signed)
  [facility] Adult Case Management Discharge Plan :  Will you be returning to the same living situation after discharge:  No. [facility] in [place]  At discharge, do you have transportation home?: Yes,  Parole Officer Do you have the ability to pay for your medications: Yes,  Community Support   Release of information consent forms completed and in the chart;  Patient's signature needed at discharge.  Patient to Follow up at:  Follow-up Information     [facility]. Go to.   Specialty: Behavioral Health Why: Please go to this provider for therapy and medication management services on Monday through Friday, arrive no later than 7:20 am. Services are provided on a first come, first served basis. Contact information: [address] [place] [address] [phone]        [facility] Follow up on 04/12/2022.   Why: Please use walk-in hours Monday through Friday from 7:30am to 4:00pm to complete an intake assessment for therapy and medication management.  A referral has been sent to this agency for you. Contact information: [address] [place] [address] [phone]         [facility] Follow up.   Why: Please go to this agency to inquire about starting services. Contact information: Address: [address], [place], [place] [address]  Phone: [phone]                Next level of care provider has access to [facility] Link:no  Safety Planning and Suicide Prevention discussed: Yes,  with patient and Civil Service fast streamer      Has patient been referred to the Quitline?: N/A patient is not a smoker  Patient has been referred for addiction treatment: Yes, This patient may request substance use treatment groups at [facility] in [place].   Aram Beecham, LCSWA 04/08/2022, 10:00 AM
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
## 2022-04-08 NOTE — [facility] Suicide Risk Assessment (Signed)
Suicide Risk Assessment  Discharge Assessment    [facility] Discharge Suicide Risk Assessment   Principal Problem: MDD (major depressive disorder), recurrent, severe, with psychosis (HCC) Discharge Diagnoses: Principal Problem:   MDD (major depressive disorder), recurrent, severe, with psychosis (HCC)   Total Time spent with patient: 30 minutes  Theodore Carr is a 36 y.o. male with a history of significant for MDD, insomnia, intermittent explosive disorder, and polysubstance abuse, who was initially admitted for inpatient psychiatric hospitalization on 04/03/2022 for management of SI with plan .   During the patient's hospitalization, patient had extensive initial psychiatric evaluation, and follow-up psychiatric evaluations every day.  Psychiatric diagnoses provided upon initial assessment: MDD without psychosis Patient's psychiatric medications were adjusted on admission: wellbutrin, gabapentin  During the hospitalization, other adjustments were made to the patient's psychiatric medication regimen: increased gabapentin and added abilify  Patient's care was discussed during the interdisciplinary team meeting every day during the hospitalization.  The patient denies having side effects to prescribed psychiatric medication.  Gradually, patient started adjusting to milieu. The patient was evaluated each day by a clinical provider to ascertain response to treatment. Improvement was noted by the patient's report of decreasing symptoms, improved sleep and appetite, affect, medication tolerance, behavior, and participation in unit programming.  Patient was asked each day to complete a self inventory noting mood, mental status, pain, new symptoms, anxiety and concerns.    Symptoms were reported as significantly decreased or resolved completely by discharge.   On day of discharge, the patient reports that their mood is stable. The patient denied having suicidal thoughts for more than 48 hours prior to  discharge.  Patient denies having homicidal thoughts.  Patient denies having auditory hallucinations.  Patient denies any visual hallucinations or other symptoms of psychosis. The patient was motivated to continue taking medication with a goal of continued improvement in mental health.   The patient reports their target psychiatric symptoms of SI responded well to the psychiatric medications, and the patient reports overall benefit other psychiatric hospitalization. Supportive psychotherapy was provided to the patient. The patient also participated in regular group therapy while hospitalized. Coping skills, problem solving as well as relaxation therapies were also part of the unit programming.  Labs were reviewed with the patient, and abnormal results were discussed with the patient.  The patient is able to verbalize their individual safety plan to this provider.  # It is recommended to the patient to continue psychiatric medications as prescribed, after discharge from the hospital.    # It is recommended to the patient to follow up with your outpatient psychiatric provider and PCP.  # It was discussed with the patient, the impact of alcohol, drugs, tobacco have been there overall psychiatric and medical wellbeing, and total abstinence from substance use was recommended the patient.ed.  # Prescriptions provided or sent directly to preferred pharmacy at discharge. Patient agreeable to plan. Given opportunity to ask questions. Appears to feel comfortable with discharge.    # In the event of worsening symptoms, the patient is instructed to call the crisis hotline, 911 and or go to the nearest ED for appropriate evaluation and treatment of symptoms. To follow-up with primary care provider for other medical issues, concerns and or health care needs  # Patient was discharged community facility with a plan to follow up as noted below.    Musculoskeletal: Strength & Muscle Tone: within normal  limits Gait & Station: normal Patient leans: N/A  Psychiatric Specialty Exam  Mental Status Exam:  General Appearance: appears at stated age, casually dressed and groomed    Behavior: pleasant and cooperative    Psychomotor Activity: no psychomotor agitation or retardation noted    Eye Contact: good  Speech: normal amount, tone, volume and fluency      Mood: euthymic  Affect: congruent, pleasant and interactive    Thought Process: linear, goal directed, no circumstantial or tangential thought process noted, no racing thoughts or flight of ideas  Descriptions of Associations: intact  Thought Content: no bizarre content, logical and future-oriented  Hallucinations: denies AH, VH , does not appear responding to stimuli  Delusions: no paranoia, delusions of control, grandeur, ideas of reference, thought broadcasting, and magical thinking  Suicidal Thoughts: denies SI, intention, plan  Homicidal Thoughts: denies HI, intention, plan    Alertness/Orientation: alert and fully oriented    Insight: fair, improved  Judgment: fair, improved    Memory: intact    Executive Functions  Concentration: intact  Attention Span: fair  Recall: intact  Fund of Knowledge: fair    No data recorded   Physical Exam  Constitutional:      Appearance: Normal appearance.  Cardiovascular:     Rate and Rhythm: Normal rate.  Pulmonary:     Effort: Pulmonary effort is normal.  Neurological:     General: No focal deficit present.     Mental Status: Alert and oriented to person, place, and time.      Review of Systems  Constitutional: Negative.  Negative for chills, fever and weight loss.  HENT: Negative.    Eyes: Negative.   Respiratory: Negative.    Cardiovascular: Negative.   Gastrointestinal:  Negative for constipation, diarrhea, nausea and vomiting.  Genitourinary: Negative.   Musculoskeletal: Negative.   Skin: Negative.   Neurological: Negative.  Negative for tingling.   Blood  pressure (!) 141/94, pulse 85, temperature 97.9 F (36.6 C), temperature source Oral, resp. rate 16, height 5\' 7"  (1.702 m), weight 70.3 kg, SpO2 97 %. Body mass index is 24.28 kg/m.  Mental Status Per Nursing Assessment::   On Admission:  Suicidal ideation indicated by patient, Self-harm thoughts  Demographic Factors:  Male, Caucasian, Living alone, and Unemployed Loss Factors: Legal issues and Financial problems/change in socioeconomic status Historical Factors: Prior suicide attempts and Impulsivity Risk Reduction Factors:   Positive therapeutic relationship and Positive coping skills or problem solving skills   Continued Clinical Symptoms:  Depression:   Hopelessness Insomnia  Cognitive Features That Contribute To Risk:  None    Suicide Risk:   Minimal: No identifiable suicidal ideation.  Patients presenting with no risk factors but with morbid ruminations; may be classified as minimal risk based on the severity of the depressive symptoms   Follow-up Information     [facility]. Go to.   Specialty: Behavioral Health Why: Please go to this provider for therapy and medication management services on Monday through Friday, arrive no later than 7:20 am. Services are provided on a first come, first served basis. Contact information: [address] [place] [phone]        [facility] Follow up on 04/12/2022.   Why: Please use walk-in hours Monday through Friday from 7:30am to 4:00pm to complete an intake assessment for therapy and medication management.  A referral has been sent to this agency for you. Contact information: [address] [place] [address]         [facility] Follow up.  Why: Please go to this agency to inquire about starting services. Contact information: Address: [address] [place], [place] [address]  Phone: [phone]                 Plan Of Care/Follow-up recommendations:  Activity: as tolerated  Diet: heart healthy  Other: -Follow-up with your outpatient psychiatric provider -instructions on appointment date, time, and address (location) are provided to you in discharge paperwork.  -Take your psychiatric medications as prescribed at discharge - instructions are provided to you in the discharge paperwork  -Follow-up with outpatient primary care doctor and other specialists -for management of preventative medicine and chronic medical disease, including: HLD, HTN  -Testing: Follow-up with outpatient provider for abnormal lab results: none  -Recommend abstinence from alcohol, tobacco, and other illicit drug use at discharge.   -If your psychiatric symptoms recur, worsen, or if you have side effects to your psychiatric medications, call your outpatient psychiatric provider, 911, [phone] or go to the nearest emergency department.  -If suicidal thoughts recur, call your outpatient psychiatric provider, 911, [phone] or go to the nearest emergency department.     Lance Muss, MD 04/08/2022, 9:56 AM

## 2022-04-20 ENCOUNTER — Inpatient Hospital Stay (HOSPITAL_COMMUNITY)
Admission: AD | Admit: 2022-04-20 | Discharge: 2022-04-29 | DRG: 885 | Disposition: A | Discharge status: Home / Self Care | Payer: Federal, State, Local not specified - Other | Source: Intra-hospital | Attending: Psychiatry | Admitting: Psychiatry

## 2022-04-20 ENCOUNTER — Other Ambulatory Visit: Payer: Self-pay

## 2022-04-20 ENCOUNTER — Encounter (HOSPITAL_COMMUNITY): Payer: Self-pay | Admitting: Family

## 2022-04-20 DIAGNOSIS — Z5900 Homelessness unspecified: Secondary | ICD-10-CM | POA: Diagnosis not present

## 2022-04-20 DIAGNOSIS — G4700 Insomnia, unspecified: Secondary | ICD-10-CM | POA: Diagnosis present

## 2022-04-20 DIAGNOSIS — F411 Generalized anxiety disorder: Secondary | ICD-10-CM | POA: Diagnosis present

## 2022-04-20 DIAGNOSIS — Z9151 Personal history of suicidal behavior: Secondary | ICD-10-CM

## 2022-04-20 DIAGNOSIS — I10 Essential (primary) hypertension: Secondary | ICD-10-CM | POA: Diagnosis present

## 2022-04-20 DIAGNOSIS — Z818 Family history of other mental and behavioral disorders: Secondary | ICD-10-CM

## 2022-04-20 DIAGNOSIS — F4010 Social phobia, unspecified: Secondary | ICD-10-CM | POA: Diagnosis present

## 2022-04-20 DIAGNOSIS — Z79899 Other long term (current) drug therapy: Secondary | ICD-10-CM

## 2022-04-20 DIAGNOSIS — F332 Major depressive disorder, recurrent severe without psychotic features: Secondary | ICD-10-CM | POA: Diagnosis not present

## 2022-04-20 DIAGNOSIS — F4310 Post-traumatic stress disorder, unspecified: Secondary | ICD-10-CM | POA: Diagnosis present

## 2022-04-20 DIAGNOSIS — F1420 Cocaine dependence, uncomplicated: Secondary | ICD-10-CM | POA: Diagnosis present

## 2022-04-20 DIAGNOSIS — R45851 Suicidal ideations: Secondary | ICD-10-CM | POA: Diagnosis present

## 2022-04-20 MED ORDER — HYDROXYZINE HCL 25 MG PO TABS
25.0000 mg | ORAL_TABLET | Freq: Three times a day (TID) | ORAL | Status: DC | PRN
  Administered 2022-04-20 – 2022-04-21 (×2): 25 mg via ORAL
  Filled 2022-04-20 (×2): qty 1

## 2022-04-20 MED ORDER — NICOTINE 14 MG/24HR TD PT24
14.0000 mg | MEDICATED_PATCH | Freq: Every day | TRANSDERMAL | Status: DC
  Filled 2022-04-20 (×5): qty 1

## 2022-04-20 MED ORDER — ACETAMINOPHEN 325 MG PO TABS
650.0000 mg | ORAL_TABLET | Freq: Four times a day (QID) | ORAL | Status: DC | PRN
  Administered 2022-04-21 – 2022-04-29 (×5): 650 mg via ORAL
  Filled 2022-04-20 (×5): qty 2

## 2022-04-20 MED ORDER — ARIPIPRAZOLE 10 MG PO TABS
10.0000 mg | ORAL_TABLET | Freq: Every day | ORAL | Status: DC
  Administered 2022-04-21: 10 mg via ORAL
  Filled 2022-04-20 (×4): qty 1

## 2022-04-20 MED ORDER — ALUM & MAG HYDROXIDE-SIMETH 200-200-20 MG/5ML PO SUSP: 30.0000 mL | ORAL | Status: DC | PRN

## 2022-04-20 MED ORDER — MAGNESIUM HYDROXIDE 400 MG/5ML PO SUSP: 30.0000 mL | Freq: Every day | ORAL | Status: DC | PRN

## 2022-04-20 MED ORDER — TRAZODONE HCL 50 MG PO TABS
50.0000 mg | ORAL_TABLET | Freq: Every evening | ORAL | Status: DC | PRN
  Administered 2022-04-20: 50 mg via ORAL
  Filled 2022-04-20: qty 1

## 2022-04-20 MED ORDER — BUPROPION HCL ER (XL) 150 MG PO TB24
150.0000 mg | ORAL_TABLET | Freq: Every day | ORAL | Status: DC
  Administered 2022-04-21 – 2022-04-29 (×9): 150 mg via ORAL
  Filled 2022-04-20 (×12): qty 1

## 2022-04-20 MED ORDER — GABAPENTIN 600 MG PO TABS
600.0000 mg | ORAL_TABLET | Freq: Three times a day (TID) | ORAL | Status: DC
  Administered 2022-04-21 – 2022-04-25 (×14): 600 mg via ORAL
  Filled 2022-04-20 (×24): qty 1

## 2022-04-20 NOTE — Progress Notes (Signed)
Psychoeducational Group Note  Date:  04/20/2022 Time:  2133  Group Topic/Focus:  Relapse Prevention Planning:   The focus of this group is to define relapse and discuss the need for planning to combat relapse.  Participation Level: Did Not Attend  Participation Quality:  Not Applicable  Affect:  Not Applicable  Cognitive:  Not Applicable  Insight:  Not Applicable  Engagement in Group: Not Applicable  Additional Comments:  The patient was admitted to the hallway after the group concluded.   Gennette Pac 04/20/2022, 9:33 PM

## 2022-04-20 NOTE — Progress Notes (Signed)
   04/20/22 2228  Psych Admission Type (Psych Patients Only)  Admission Status Voluntary  Psychosocial Assessment  Patient Complaints Anxiety;Depression  Eye Contact Brief  Facial Expression Flat  Affect Depressed  Speech Logical/coherent  Interaction Assertive  Motor Activity Slow  Appearance/Hygiene Unremarkable  Behavior Characteristics Appropriate to situation;Anxious;Cooperative  Mood Depressed  Thought Process  Coherency WDL  Content Preoccupation  Delusions WDL  Perception WDL  Hallucination None reported or observed  Judgment Limited  Confusion None  Danger to Self  Current suicidal ideation? Denies  Agreement Not to Harm Self Yes  Description of Agreement Verbal  Danger to Others  Danger to Others None reported or observed

## 2022-04-21 DIAGNOSIS — F4010 Social phobia, unspecified: Secondary | ICD-10-CM | POA: Diagnosis present

## 2022-04-21 DIAGNOSIS — F411 Generalized anxiety disorder: Secondary | ICD-10-CM | POA: Diagnosis present

## 2022-04-21 DIAGNOSIS — F4310 Post-traumatic stress disorder, unspecified: Secondary | ICD-10-CM | POA: Diagnosis present

## 2022-04-21 DIAGNOSIS — F332 Major depressive disorder, recurrent severe without psychotic features: Principal | ICD-10-CM

## 2022-04-21 DIAGNOSIS — G4700 Insomnia, unspecified: Secondary | ICD-10-CM | POA: Diagnosis present

## 2022-04-21 HISTORY — DX: Social phobia, unspecified: F40.10

## 2022-04-21 HISTORY — DX: Insomnia, unspecified: G47.00

## 2022-04-21 MED ORDER — HYDROXYZINE HCL 50 MG PO TABS
50.0000 mg | ORAL_TABLET | Freq: Three times a day (TID) | ORAL | Status: DC | PRN
  Administered 2022-04-21: 50 mg via ORAL

## 2022-04-21 MED ORDER — HYDROXYZINE HCL 25 MG PO TABS
25.0000 mg | ORAL_TABLET | Freq: Three times a day (TID) | ORAL | Status: DC | PRN
  Administered 2022-04-21 – 2022-04-23 (×4): 25 mg via ORAL
  Filled 2022-04-21 (×5): qty 1

## 2022-04-21 MED ORDER — IBUPROFEN 600 MG PO TABS
ORAL_TABLET | ORAL | Status: AC
  Filled 2022-04-21: qty 1

## 2022-04-21 MED ORDER — QUETIAPINE FUMARATE 25 MG PO TABS
25.0000 mg | ORAL_TABLET | Freq: Two times a day (BID) | ORAL | Status: DC
  Administered 2022-04-21 – 2022-04-23 (×5): 25 mg via ORAL
  Filled 2022-04-21 (×9): qty 1

## 2022-04-21 MED ORDER — IBUPROFEN 600 MG PO TABS
600.0000 mg | ORAL_TABLET | Freq: Four times a day (QID) | ORAL | Status: DC | PRN
  Administered 2022-04-21 – 2022-04-29 (×15): 600 mg via ORAL
  Filled 2022-04-21 (×13): qty 1

## 2022-04-21 MED ORDER — OLANZAPINE 10 MG PO TBDP
10.0000 mg | ORAL_TABLET | Freq: Two times a day (BID) | ORAL | Status: DC | PRN
  Administered 2022-04-22: 10 mg via ORAL
  Filled 2022-04-21 (×2): qty 1

## 2022-04-21 MED ORDER — OLANZAPINE 10 MG IM SOLR: 10.0000 mg | Freq: Two times a day (BID) | INTRAMUSCULAR | Status: DC | PRN

## 2022-04-21 MED ORDER — HYDROXYZINE HCL 50 MG PO TABS
ORAL_TABLET | ORAL | Status: AC
  Filled 2022-04-21: qty 1

## 2022-04-21 MED ORDER — QUETIAPINE FUMARATE 50 MG PO TABS
50.0000 mg | ORAL_TABLET | Freq: Every day | ORAL | Status: DC
  Administered 2022-04-21 – 2022-04-22 (×2): 50 mg via ORAL
  Filled 2022-04-21 (×6): qty 1
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
## 2022-04-21 NOTE — Group Note (Signed)
LCSW Group Therapy Note   Group Date: 04/21/2022 Start Time: 1100 End Time: 1200  Type of Therapy and Topic:  Group Therapy:  Strengths Exploration  Participation Level: Active  Description of Group: This group allows individuals to explore their strengths, learn to use strengths in new ways to improve well-being. Strengths-based interventions involve identifying strengths, understanding how they are used, and learning new ways to apply them. Individuals will identify their strengths, and then explore their roles in different areas of life (relationships, professional life, and personal fulfillment). Individuals will think about ways in which they currently use their strengths, along with new ways they could begin using them.   Therapeutic Goals Patient will verbalize two of their strengths Patient will identify how their strengths are currently used Patient will identify two new ways to apply their strengths  Patients will create a plan to apply their strengths in their daily lives     Summary of Patient Progress:  The Pt attended group and remained there the entire time.  The Pt accepted all worksheets and materials that were provided.  The Pt participated well and was appropriate with their peers.  The Pt demonstrated an understanding of the discussion topic.    Therapeutic Modalities Cognitive Behavioral Therapy Motivational Interviewing  Darleen Crocker, [place] 04/21/2022  2:41 PM

## 2022-04-21 NOTE — BHH Group Notes (Signed)
Goals Group: 5 out of 10 Goal: Talk to parlor officer

## 2022-04-21 NOTE — BHH Counselor (Signed)
CSW spoke with the Pt and informed him that he had a new Research officer, trade union.  CSW provided the Pt with the new Parole Officers phone number [phone] (office).  The Pt contacted his Parole Officer [name] and provided CSW with the cell phone number for Theodore Carr as well [phone].   CSW contacted Theodore Carr at her cell phone number.  She states that today was her first day speaking with Theodore Carr.  She states that his concerns were for housing. She states that "I will look into [employer] again for him but I am not sure if they will take him before he discharges from the hospital".  Theodore Carr asked CSW about long term programs for Theodore Carr. CSW informed [name] that the Pt's options are limited without insurance and that [facility] or [facility] would be the best options for the Pt at this time.  Theodore Carr states that she will be working on his housing concerns and hopes to have something available prior to discharge.  CSW will follow-up with Theodore Carr and with the Pt for discharge planning.

## 2022-04-21 NOTE — Progress Notes (Signed)
Adult Psychoeducational Group Note  Date:  04/21/2022 Time:  8:07 PM  Group Topic/Focus:  Wrap-Up Group:   The focus of this group is to help patients review their daily goal of treatment and discuss progress on daily workbooks.  Participation Level:  Active  Participation Quality:  Appropriate  Affect:  Appropriate  Cognitive:  Appropriate  Insight: Appropriate  Engagement in Group:  Engaged  Modes of Intervention:  Discussion  Additional Comments:  Theodore Carr said his day was a 5. Hus goal was to reach [employer]. Yes he reach goal. Copong skills pray and call family  [name] 04/21/2022, 8:07 PM

## 2022-04-21 NOTE — Progress Notes (Signed)
D: Patient is alert, oriented, anxious, and cooperative. Denies SI, HI, AVH, and verbally contracts for safety.    A: Scheduled medications administered per MD order. Support provided. Patient educated on safety on the unit and medications. Routine safety checks every 15 minutes. Patient stated understanding to tell nurse about any new physical symptoms. Patient understands to tell staff of any needs.     R: No adverse drug reactions noted. Patient verbally contracts for safety. Patient remains safe at this time and will continue to monitor.    04/21/22 0800  Psych Admission Type (Psych Patients Only)  Admission Status Voluntary  Psychosocial Assessment  Patient Complaints Anxiety  Eye Contact Brief  Facial Expression Flat  Affect Depressed  Speech Logical/coherent  Interaction Assertive  Motor Activity Slow  Appearance/Hygiene Unremarkable  Behavior Characteristics Cooperative;Appropriate to situation  Mood Depressed;Anxious  Thought Process  Coherency WDL  Content WDL  Delusions None reported or observed  Perception WDL  Hallucination None reported or observed  Judgment Limited  Confusion None  Danger to Self  Current suicidal ideation? Denies  Agreement Not to Harm Self Yes  Description of Agreement  (verbal contract for safety)  Danger to Others  Danger to Others None reported or observed
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
## 2022-04-21 NOTE — Progress Notes (Signed)
Patient is 37 year old Caucasian male that arrives to this facility via safe transport. Patient presents to the facility c/o worsening suicidal ideations. Also reports homicidal ideations towards his old roommate, Purcell Nails, with no plan of action. Reports thinking of ways to "hang myself and I tried to hang myself the other day." Patient was residing at a rehabilitation center in [place], [place], but refuses to states why he was removed. Patient is currently homeless. Reports being incarcerated in solitary confinement, which he states triggered his depression and anxiety. Reports having difficulty finding and job and housing. Arrived to the facility with three trash bags filled with clothing items. Patient also states he has ran out of his medications three days ago. Patient denies SI, "I am not having those thoughts right now, but I was though." Denies HI and AVH ay this time. Patient alert and oriented x4. Patient is ambulatory. Patient contracts for safety at this time. Patient oriented to unit. Patient given food tray. Patient denies having any questions at this time.
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
## 2022-04-21 NOTE — Progress Notes (Signed)
Patient refusing to get labs drawn this evening. He reports he will do it in the morning.
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
## 2022-04-21 NOTE — Progress Notes (Signed)
Theodore Carr [birth year] MRN: [id number]    PATIENT STRESSORS: Financial difficulties   Finding housing  Finding employment  Substance abuse   Unable to get medication    PATIENT STRENGTHS: General fund of knowledge      PATIENT IDENTIFIED PROBLEMS: Depression/Auditory hallucinations  Homelessness   Unemployed  "Unable to get my meds"   SI/ HI thoughts                   DISCHARGE CRITERIA:  Ability to meet basic life and health needs Improved stabilization in mood, thinking, and/or behavior Verbal commitment to aftercare and medication compliance   PRELIMINARY DISCHARGE PLAN: Placement in alternative living arrangements Plan to get medication medication to stay on regimen    PATIENT/FAMILY INVOLVEMENT: This treatment plan has been presented to and reviewed with the patient, [name]. The patient has been given the opportunity to ask questions and make suggestions.   [address] Durenda Guthrie, RN 04/21/2021 1:28 AM

## 2022-04-22 ENCOUNTER — Encounter (HOSPITAL_COMMUNITY): Payer: Self-pay

## 2022-04-22 DIAGNOSIS — F332 Major depressive disorder, recurrent severe without psychotic features: Secondary | ICD-10-CM | POA: Diagnosis not present

## 2022-04-22 LAB — CBC WITH DIFFERENTIAL/PLATELET
Abs Immature Granulocytes: 0.01 10*3/uL (ref 0.00–0.07)
Basophils Absolute: 0 10*3/uL (ref 0.0–0.1)
Basophils Relative: 1 %
Eosinophils Absolute: 0.6 10*3/uL — ABNORMAL HIGH (ref 0.0–0.5)
Eosinophils Relative: 9 %
HCT: 41.7 % (ref 39.0–52.0)
Hemoglobin: 13.4 g/dL (ref 13.0–17.0)
Immature Granulocytes: 0 %
Lymphocytes Relative: 30 %
Lymphs Abs: 1.9 10*3/uL (ref 0.7–4.0)
MCH: 30.5 pg (ref 26.0–34.0)
MCHC: 32.1 g/dL (ref 30.0–36.0)
MCV: 95 fL (ref 80.0–100.0)
Monocytes Absolute: 0.5 10*3/uL (ref 0.1–1.0)
Monocytes Relative: 9 %
Neutro Abs: 3.1 10*3/uL (ref 1.7–7.7)
Neutrophils Relative %: 51 %
Platelets: 326 10*3/uL (ref 150–400)
RBC: 4.39 MIL/uL (ref 4.22–5.81)
RDW: 13.6 % (ref 11.5–15.5)
WBC: 6.1 10*3/uL (ref 4.0–10.5)
nRBC: 0 % (ref 0.0–0.2)

## 2022-04-22 LAB — VITAMIN D 25 HYDROXY (VIT D DEFICIENCY, FRACTURES): Vit D, 25-Hydroxy: 16.4 ng/mL — ABNORMAL LOW (ref 30–100)

## 2022-04-22 LAB — VITAMIN B12: Vitamin B-12: 183 pg/mL (ref 180–914)

## 2022-04-22 MED ORDER — PRAZOSIN HCL 1 MG PO CAPS
1.0000 mg | ORAL_CAPSULE | Freq: Every day | ORAL | Status: DC
  Administered 2022-04-22 – 2022-04-23 (×2): 1 mg via ORAL
  Filled 2022-04-22 (×5): qty 1

## 2022-04-22 MED ORDER — VITAMIN D (ERGOCALCIFEROL) 1.25 MG (50000 UNIT) PO CAPS
50000.0000 [IU] | ORAL_CAPSULE | ORAL | Status: DC
  Administered 2022-04-22: 50000 [IU] via ORAL
  Filled 2022-04-22 (×2): qty 1

## 2022-04-22 MED ORDER — METHOCARBAMOL 500 MG PO TABS
500.0000 mg | ORAL_TABLET | Freq: Once | ORAL | Status: AC
  Administered 2022-04-22: 500 mg via ORAL
  Filled 2022-04-22 (×2): qty 1

## 2022-04-22 NOTE — BH IP Treatment Plan (Signed)
Interdisciplinary Treatment and Diagnostic Plan Update  04/22/2022 Time of Session: [place] MRN: [id number]  Principal Diagnosis: MDD (major depressive disorder), recurrent severe, without psychosis ([place])  Secondary Diagnoses: Principal Problem:   MDD (major depressive disorder), recurrent severe, without psychosis ([place]) Active Problems:   Cocaine use disorder, severe, dependence ([place])   Insomnia   Social anxiety disorder   PTSD (post-traumatic stress disorder)   Current Medications:  Current Facility-Administered Medications  Medication Dose Route Frequency Provider Last Rate Last Admin   acetaminophen (TYLENOL) tablet 650 mg  650 mg Oral Q6H PRN Lucky Rathke, FNP   650 mg at 04/21/22 0820   alum & mag hydroxide-simeth (MAALOX/MYLANTA) 200-200-20 MG/5ML suspension 30 mL  30 mL Oral Q4H PRN Lucky Rathke, FNP       buPROPion (WELLBUTRIN XL) 24 hr tablet 150 mg  150 mg Oral Daily Lucky Rathke, FNP   150 mg at 04/22/22 0746   gabapentin (NEURONTIN) tablet 600 mg  600 mg Oral TID Lucky Rathke, FNP   600 mg at 04/22/22 0746   hydrOXYzine (ATARAX) tablet 25 mg  25 mg Oral TID PRN Nicholes Rough, NP   25 mg at 04/22/22 1062   ibuprofen (ADVIL) tablet 600 mg  600 mg Oral Q6H PRN Nicholes Rough, NP   600 mg at 04/22/22 0641   magnesium hydroxide (MILK OF MAGNESIA) suspension 30 mL  30 mL Oral Daily PRN Lucky Rathke, FNP       nicotine (NICODERM CQ - dosed in mg/24 hours) patch 14 mg  14 mg Transdermal Daily Massengill, Nathan, MD       OLANZapine (ZYPREXA) injection 10 mg  10 mg Intramuscular BID PRN Massengill, Ovid Curd, MD       OLANZapine zydis (ZYPREXA) disintegrating tablet 10 mg  10 mg Oral BID PRN Massengill, Ovid Curd, MD       QUEtiapine (SEROQUEL) tablet 25 mg  25 mg Oral BID Nicholes Rough, NP   25 mg at 04/22/22 0746   QUEtiapine (SEROQUEL) tablet 50 mg  50 mg Oral QHS Nicholes Rough, NP   50 mg at 04/21/22 2128   PTA Medications: Medications Prior to Admission   Medication Sig Dispense Refill Last Dose   fenofibrate (TRICOR) 145 MG tablet Take 145 mg by mouth daily.      ibuprofen (ADVIL) 600 MG tablet Take 600 mg by mouth every 8 (eight) hours as needed.      ARIPiprazole (ABILIFY) 10 MG tablet Take 1 tablet (10 mg total) by mouth daily. 30 tablet 0    buPROPion (WELLBUTRIN XL) 150 MG 24 hr tablet Take 1 tablet (150 mg total) by mouth daily. 30 tablet 0    gabapentin (NEURONTIN) 300 MG capsule Take 2 capsules (600 mg total) by mouth 3 (three) times daily. 180 capsule 0    hydrOXYzine (ATARAX) 25 MG tablet Take 1 tablet (25 mg total) by mouth 3 (three) times daily as needed for anxiety. 60 tablet 0    traZODone (DESYREL) 50 MG tablet Take 1 tablet (50 mg total) by mouth at bedtime as needed for sleep. 30 tablet 0     Patient Stressors:    Patient Strengths:    Treatment Modalities: Medication Management, Group therapy, Case management,  1 to 1 session with clinician, Psychoeducation, Recreational therapy.   Physician Treatment Plan for Primary Diagnosis: MDD (major depressive disorder), recurrent severe, without psychosis ([place]) Long Term Goal(s): Improvement in symptoms so as ready for discharge   Short Term  Goals: Ability to identify changes in lifestyle to reduce recurrence of condition will improve Ability to disclose and discuss suicidal ideas Ability to identify and develop effective coping behaviors will improve Compliance with prescribed medications will improve Ability to identify triggers associated with substance abuse/mental health issues will improve  Medication Management: Evaluate patient's response, side effects, and tolerance of medication regimen.  Therapeutic Interventions: 1 to 1 sessions, Unit Group sessions and Medication administration.  Evaluation of Outcomes: Not Met  Physician Treatment Plan for Secondary Diagnosis: Principal Problem:   MDD (major depressive disorder), recurrent severe, without psychosis  ([facility]) Active Problems:   Cocaine use disorder, severe, dependence ([facility])   Insomnia   Social anxiety disorder   PTSD (post-traumatic stress disorder)  Long Term Goal(s): Improvement in symptoms so as ready for discharge   Short Term Goals: Ability to identify changes in lifestyle to reduce recurrence of condition will improve Ability to disclose and discuss suicidal ideas Ability to identify and develop effective coping behaviors will improve Compliance with prescribed medications will improve Ability to identify triggers associated with substance abuse/mental health issues will improve     Medication Management: Evaluate patient's response, side effects, and tolerance of medication regimen.  Therapeutic Interventions: 1 to 1 sessions, Unit Group sessions and Medication administration.  Evaluation of Outcomes: Not Met   RN Treatment Plan for Primary Diagnosis: MDD (major depressive disorder), recurrent severe, without psychosis ([facility]) Long Term Goal(s): Knowledge of disease and therapeutic regimen to maintain health will improve  Short Term Goals: Ability to remain free from injury will improve, Ability to verbalize frustration and anger appropriately will improve, Ability to demonstrate self-control, Ability to participate in decision making will improve, Ability to verbalize feelings will improve, Ability to disclose and discuss suicidal ideas, Ability to identify and develop effective coping behaviors will improve, and Compliance with prescribed medications will improve  Medication Management: RN will administer medications as ordered by provider, will assess and evaluate patient's response and provide education to patient for prescribed medication. RN will report any adverse and/or side effects to prescribing provider.  Therapeutic Interventions: 1 on 1 counseling sessions, Psychoeducation, Medication administration, Evaluate responses to treatment, Monitor vital signs and CBGs as  ordered, Perform/monitor CIWA, COWS, AIMS and Fall Risk screenings as ordered, Perform wound care treatments as ordered.  Evaluation of Outcomes: Not Met   LCSW Treatment Plan for Primary Diagnosis: MDD (major depressive disorder), recurrent severe, without psychosis ([facility]) Long Term Goal(s): Safe transition to appropriate next level of care at discharge, Engage patient in therapeutic group addressing interpersonal concerns.  Short Term Goals: Engage patient in aftercare planning with referrals and resources, Increase social support, Increase ability to appropriately verbalize feelings, Increase emotional regulation, Facilitate acceptance of mental health diagnosis and concerns, Facilitate patient progression through stages of change regarding substance use diagnoses and concerns, Identify triggers associated with mental health/substance abuse issues, and Increase skills for wellness and recovery  Therapeutic Interventions: Assess for all discharge needs, 1 to 1 time with Social worker, Explore available resources and support systems, Assess for adequacy in community support network, Educate family and significant other(s) on suicide prevention, Complete Psychosocial Assessment, Interpersonal group therapy.  Evaluation of Outcomes: Not Met   Progress in Treatment: Attending groups: No. Participating in groups: No. Taking medication as prescribed: No. Toleration medication: Yes. Family/Significant other contact made: Yes, individual(s) contacted:  [phone] (new [employer]) Patient understands diagnosis: Yes. Discussing patient identified problems/goals with staff: Yes. Medical problems stabilized or resolved: Yes. Denies suicidal/homicidal  ideation: Yes. Issues/concerns per patient self-inventory: Yes. Other:   New problem(s) identified: None reported  New Short Term/Long Term Goal(s):Medication Stabilization  Patient Goals:  Coping Skills  Discharge Plan or Barriers: None  Reported  Reason for Continuation of Hospitalization: Anxiety Depression Medication stabilization Suicidal ideation Withdrawal symptoms  Estimated Length of Stay: 3-7 Days  Last 3 [place] Suicide Severity Risk Score: [place] Admission (Current) from 04/20/2022 in [facility] Admission (Discharged) from 04/03/2022 in [facility] ED from 04/02/2022 in [facility] High Risk High Risk No Risk       Last PHQ 2/9 Scores:    04/02/2022    6:33 PM 02/11/2015   10:00 AM  Depression screen PHQ 2/9  Decreased Interest 1 0  Down, Depressed, Hopeless 2 1  PHQ - 2 Score 3 1  Altered sleeping 1   Tired, decreased energy 1   Change in appetite 1   Feeling bad or failure about yourself  2   Trouble concentrating 1   Moving slowly or fidgety/restless 0   Suicidal thoughts 2   PHQ-9 Score 11   Difficult doing work/chores Very difficult    detox, medication management for mood stabilization; elimination of SI thoughts; development of comprehensive mental wellness/sobriety plan   Scribe for Treatment Team: Windle Guard, LCSW 04/22/2022 10:49 AM

## 2022-04-22 NOTE — Group Note (Signed)
Date:  04/22/2022 Time:  1:51 PM  Group Topic/Focus:  Orientation:   The focus of this group is to educate the patient on the purpose and policies of crisis stabilization and provide a format to answer questions about their admission.  The group details unit policies and expectations of patients while admitted.    Participation Level:  Minimal  Participation Quality:  Attentive  Affect:  Blunted  Cognitive:  Appropriate  Insight: Good  Engagement in Group:  Lacking  Modes of Intervention:  Discussion  Additional Comments:     Jerrye Beavers 04/22/2022, 1:51 PM

## 2022-04-22 NOTE — Plan of Care (Signed)
  Problem: Education: Goal: Knowledge of disease or condition will improve Outcome: Progressing Goal: Understanding of discharge needs will improve Outcome: Progressing   Problem: Health Behavior/Discharge Planning: Goal: Ability to identify changes in lifestyle to reduce recurrence of condition will improve Outcome: Progressing Goal: Identification of resources available to assist in meeting health care needs will improve Outcome: Progressing   Problem: Physical Regulation: Goal: Complications related to the disease process, condition or treatment will be avoided or minimized Outcome: Progressing   Problem: Safety: Goal: Ability to remain free from injury will improve Outcome: Progressing   Problem: Education: Goal: Knowledge of Safety [facility] Education information/materials will improve Outcome: Progressing Goal: Emotional status will improve Outcome: Progressing Goal: Mental status will improve Outcome: Progressing Goal: Verbalization of understanding the information provided will improve Outcome: Progressing   Problem: Activity: Goal: Interest or engagement in activities will improve Outcome: Progressing Goal: Sleeping patterns will improve Outcome: Progressing   Problem: Coping: Goal: Ability to verbalize frustrations and anger appropriately will improve Outcome: Progressing Goal: Ability to demonstrate self-control will improve Outcome: Progressing   Problem: Health Behavior/Discharge Planning: Goal: Identification of resources available to assist in meeting health care needs will improve Outcome: Progressing Goal: Compliance with treatment plan for underlying cause of condition will improve Outcome: Progressing   Problem: Physical Regulation: Goal: Ability to maintain clinical measurements within normal limits will improve Outcome: Progressing   Problem: Safety: Goal: Periods of time without injury will increase Outcome: Progressing   Problem:  Education: Goal: Ability to make informed decisions regarding treatment will improve Outcome: Progressing   Problem: Coping: Goal: Coping ability will improve Outcome: Progressing   Problem: Health Behavior/Discharge Planning: Goal: Identification of resources available to assist in meeting health care needs will improve Outcome: Progressing   Problem: Medication: Goal: Compliance with prescribed medication regimen will improve Outcome: Progressing   Problem: Self-Concept: Goal: Ability to disclose and discuss suicidal ideas will improve Outcome: Progressing Goal: Will verbalize positive feelings about self Outcome: Progressing   Problem: Education: Goal: Ability to state activities that reduce stress will improve Outcome: Progressing   Problem: Coping: Goal: Ability to identify and develop effective coping behavior will improve Outcome: Progressing

## 2022-04-22 NOTE — Progress Notes (Signed)
D: Patient alert and oriented, able to make needs known. Denies SI/HI, AVH at present. Denies pain at present. Patient goal today "to reach my PO again." Rates depression 8/10, hopelessness 8/10, and anxiety 8/10. Patient reports energy level as low. He reports she slept fair last night. Patient does not request any PRN medication at this time.   A: Scheduled medications administered to patient per MD order. Support and encouragement provided. Routine safety checks conducted every fifteen minutes. Patient informed to notify staff with problems or concerns. Frequent verbal contact made.   R: No adverse drug reactions noted. Patient contracts for safety at this time. Patient is compliant with medications and treatment plan. Patient receptive, calm and cooperative. Patient interacts with others appropriately on unit at present. Patient remains safe at present.

## 2022-04-22 NOTE — [facility] Group Notes (Signed)
Pt did not attend AA group  

## 2022-04-22 NOTE — Progress Notes (Signed)
   04/22/22 0600  15 Minute Checks  Location Bedroom  Visual Appearance Calm  Behavior Composed  Sleep (Behavioral Health Patients Only)  Calculate sleep? (Click Yes once per 24 hr at 0600 safety check) Yes  Documented sleep last 24 hours 6.75

## 2022-04-22 NOTE — Progress Notes (Signed)
Pt requested Nicotine patch be discontinued. He stated he hasn't used any nicotine in years.

## 2022-04-22 NOTE — Progress Notes (Signed)
   04/21/22 2128  Psych Admission Type (Psych Patients Only)  Admission Status Voluntary  Psychosocial Assessment  Patient Complaints Anxiety;Depression;Other (Comment) (SI thoughts.)  Eye Contact Brief  Facial Expression Flat  Affect Depressed  Speech Logical/coherent  Interaction Assertive  Motor Activity Fidgety;Restless  Appearance/Hygiene Unremarkable  Behavior Characteristics Cooperative;Anxious  Mood Anxious;Depressed  Aggressive Behavior  Effect No apparent injury  Thought Process  Coherency WDL  Content WDL  Delusions None reported or observed  Perception WDL  Hallucination None reported or observed  Judgment Limited  Confusion None  Danger to Self  Current suicidal ideation? Active  Description of Suicide Plan "Hang myself".  Self-Injurious Behavior Self-injurious ideation verbalized  Agreement Not to Harm Self Yes  Description of Agreement Verbally contracts for safety.  Danger to Others  Danger to Others None reported or observed   Patient alert and oriented. Presenting with a depressed affect and anxious, depressed mood. Patient endorses SI, with plan to "hang myself". Patient denies specific plan to act on SI and denies intent while at [facility]. Patient states "these thoughts are just in the back of my mind". Patient is able to verbally contract for safety and agrees to inform a member of staff prior to acting on any thoughts of harm/SI. Patient denies HI, AVH, and pain. Patient rates anxiety 10/10 and depression 7/10 on 0-10 scale with 10 being the worst. PRN hydroxyzine administered. Scheduled medication administered to patient, per provider orders. Support and encouragement provided. Routine safety checks conducted every 15 minutes and remains safe on the unit.
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
## 2022-04-22 NOTE — B[facility] Counselor (Signed)
CSW spoke with the Pt about [facility] in [place], [place].  And [facility] in the [place] area.  CSW provided information about the 2 sober living programs and provided the Pt with the phone numbers.  CSW also provided the Pt with a list of [facility] in the [place] area.

## 2022-04-23 MED ORDER — HYDROXYZINE HCL 50 MG PO TABS
50.0000 mg | ORAL_TABLET | Freq: Four times a day (QID) | ORAL | Status: DC | PRN
  Administered 2022-04-23 – 2022-04-25 (×7): 50 mg via ORAL
  Filled 2022-04-23 (×8): qty 1

## 2022-04-23 MED ORDER — QUETIAPINE FUMARATE 100 MG PO TABS
100.0000 mg | ORAL_TABLET | Freq: Every day | ORAL | Status: DC
  Administered 2022-04-23: 100 mg via ORAL
  Filled 2022-04-23 (×4): qty 1

## 2022-04-23 MED ORDER — FENOFIBRATE 160 MG PO TABS
160.0000 mg | ORAL_TABLET | Freq: Every day | ORAL | Status: DC
  Administered 2022-04-23 – 2022-04-29 (×7): 160 mg via ORAL
  Filled 2022-04-23 (×10): qty 1

## 2022-04-23 NOTE — Progress Notes (Signed)
[facility] MD Progress Note  04/23/2022 4:47 PM Theodore Carr  MRN:  [id number] Principal Problem: MDD (major depressive disorder), recurrent severe, without psychosis (HCC) Diagnosis: Principal Problem:   MDD (major depressive disorder), recurrent severe, without psychosis (HCC) Active Problems:   Cocaine use disorder, severe, dependence (HCC)   Insomnia   Social anxiety disorder   PTSD (post-traumatic stress disorder)  Reason For Admission: Theodore Carr is a 37 yo Caucasian male with a self reported past mental health history of MDD, bipolar 2 d/o, social anxiety, PTSD, and paranoia, who presented to the [facility] health, [place] with complaints of worsening suicide thoughts with plan to hang himself and homicidal ideations towards his room mate. Pt was transferred voluntarily to this [facility] for treatment and stabilization of his mental status.   24 hr chart review: BP WNL for the past 24 hrs. Pt has been compliant with his scheduled medications for the past 24 hrs, required Hydroxyzine 25 mg earlier today morning for anxiety and Ibuprofen 600 mg for pain. He required Zyprexa Zydis 10 mg today morning for agitation. Pt has attended some unit group sessions over the past 24 hrs, and has continued to be be minimally interactive.   Patient assessment note, 04/23/2021: Mood remains depressed, affect is congruent. Pt's attention to personal hygiene and grooming is fair, eye contact is good, speech is clear & coherent. Thought contents are organized and logical, and pt currently denies SI/HI/AVH or paranoia. There is no evidence of delusional thoughts.    Pt complained of tiredness earlier today morning, states that the day time Seroquel is causing him day time grogginess and tiredness. He is requesting for Seroquel to be moved to nighttime. Seroquel 25 mg BID will be moved to HS. Pt will received a total of Seroquel 100 mg nightly starting tonight., 1/6. Continuing all other medications as below. Pt reports a fair  sleep quality last night to Clinical research associate, stated to his assigned RN that sleep was poor, and requested Ambien. He has been educated that there will be no Ambien ordered during this hospitalization for him. He reports a good appetite, denies any medication related side effects. He has been educated on the need to call half way houses for discharge planning. He states that he has interviews with two of those on Monday. We will plan to discharge on Tuesday or Wednesday, 9 or 10, if depressive symptoms continue to resolve, and if outpatient f/u by CSW has been completed.  Total Time spent with patient: 45 minutes  Past Psychiatric History: See H & P  Past Medical History:  Past Medical History:  Diagnosis Date   Compression fracture of T12 vertebra (HCC) 2008   Depression    Hypertension    History reviewed. No pertinent surgical history. Family History:  Family History  Problem Relation Age of Onset   Drug abuse Cousin    Family Psychiatric  History: See H & P Social History:  Social History   Substance and Sexual Activity  Alcohol Use Yes   Alcohol/week: 2.0 standard drinks of alcohol   Types: 2 Cans of beer per week   Comment: last used 4 days ago in prison     Social History   Substance and Sexual Activity  Drug Use Yes   Frequency: 2.0 times per week   Types: Cocaine, Marijuana   Comment: last used 4 days ago    Social History   Socioeconomic History   Marital status: Single    Spouse name: Not on file  Number of children: Not on file   Years of education: 12   Highest education level: GED or equivalent  Occupational History   Not on file  Tobacco Use   Smoking status: Former    Packs/day: 1.00    Years: 15.00    Total pack years: 15.00    Types: Cigarettes    Quit date: 03/18/2022    Years since quitting: 0.0   Smokeless tobacco: Never  Vaping Use   Vaping Use: Never used  Substance and Sexual Activity   Alcohol use: Yes    Alcohol/week: 2.0 standard drinks of  alcohol    Types: 2 Cans of beer per week    Comment: last used 4 days ago in prison   Drug use: Yes    Frequency: 2.0 times per week    Types: Cocaine, Marijuana    Comment: last used 4 days ago   Sexual activity: Yes  Other Topics Concern   Not on file  Social History Narrative   Not on file   Social Determinants of Health   Financial Resource Strain: Not on file  Food Insecurity: No Food Insecurity (04/20/2022)   Hunger Vital Sign    Worried About Running Out of Food in the Last Year: Never true    Ran Out of Food in the Last Year: Never true  Transportation Needs: Unknown (04/20/2022)   PRAPARE - Administrator, Civil Service (Medical): Patient refused    Lack of Transportation (Non-Medical): Patient refused  Physical Activity: Not on file  Stress: Not on file  Social Connections: Not on file   Sleep: Poor  Appetite:  Fair  Current Medications: Current Facility-Administered Medications  Medication Dose Route Frequency Provider Last Rate Last Admin   acetaminophen (TYLENOL) tablet 650 mg  650 mg Oral Q6H PRN Lenard Lance, FNP   650 mg at 04/21/22 0820   alum & mag hydroxide-simeth (MAALOX/MYLANTA) 200-200-20 MG/5ML suspension 30 mL  30 mL Oral Q4H PRN Lenard Lance, FNP       buPROPion (WELLBUTRIN XL) 24 hr tablet 150 mg  150 mg Oral Daily Lenard Lance, FNP   150 mg at 04/23/22 0900   fenofibrate tablet 160 mg  160 mg Oral Daily Starleen Blue, NP   160 mg at 04/23/22 1011   gabapentin (NEURONTIN) tablet 600 mg  600 mg Oral TID Lenard Lance, FNP   600 mg at 04/23/22 1202   hydrOXYzine (ATARAX) tablet 50 mg  50 mg Oral Q6H PRN Starleen Blue, NP   50 mg at 04/23/22 1246   ibuprofen (ADVIL) tablet 600 mg  600 mg Oral Q6H PRN Starleen Blue, NP   600 mg at 04/23/22 0730   magnesium hydroxide (MILK OF MAGNESIA) suspension 30 mL  30 mL Oral Daily PRN Lenard Lance, FNP       OLANZapine (ZYPREXA) injection 10 mg  10 mg Intramuscular BID PRN Massengill, Harrold Donath, MD        OLANZapine zydis (ZYPREXA) disintegrating tablet 10 mg  10 mg Oral BID PRN Massengill, Harrold Donath, MD   10 mg at 04/22/22 1048   prazosin (MINIPRESS) capsule 1 mg  1 mg Oral QHS Madhuri Vacca, NP   1 mg at 04/22/22 2137   QUEtiapine (SEROQUEL) tablet 100 mg  100 mg Oral QHS Deloros Beretta, NP       Vitamin D (Ergocalciferol) (DRISDOL) 1.25 MG (50000 UNIT) capsule 50,000 Units  50,000 Units Oral Q7 days Starleen Blue, NP  50,000 Units at 04/22/22 1555    Lab Results:  Results for orders placed or performed during the hospital encounter of 04/20/22 (from the past 48 hour(s))  CBC with Differential/Platelet     Status: Abnormal   Collection Time: 04/22/22  6:21 AM  Result Value Ref Range   WBC 6.1 4.0 - 10.5 K/uL   RBC 4.39 4.22 - 5.81 MIL/uL   Hemoglobin 13.4 13.0 - 17.0 g/dL   HCT 16.6 06.0 - 04.5 %   MCV 95.0 80.0 - 100.0 fL   MCH 30.5 26.0 - 34.0 pg   MCHC 32.1 30.0 - 36.0 g/dL   RDW 99.7 74.1 - 42.3 %   Platelets 326 150 - 400 K/uL   nRBC 0.0 0.0 - 0.2 %   Neutrophils Relative % 51 %   Neutro Abs 3.1 1.7 - 7.7 K/uL   Lymphocytes Relative 30 %   Lymphs Abs 1.9 0.7 - 4.0 K/uL   Monocytes Relative 9 %   Monocytes Absolute 0.5 0.1 - 1.0 K/uL   Eosinophils Relative 9 %   Eosinophils Absolute 0.6 (H) 0.0 - 0.5 K/uL   Basophils Relative 1 %   Basophils Absolute 0.0 0.0 - 0.1 K/uL   Immature Granulocytes 0 %   Abs Immature Granulocytes 0.01 0.00 - 0.07 K/uL    Comment: Performed at [facility], [address], [place], [place] [address]  VITAMIN D 25 Hydroxy (Vit-D Deficiency, Fractures)     Status: Abnormal   Collection Time: 04/22/22  6:21 AM  Result Value Ref Range   Vit D, 25-Hydroxy 16.40 (L) 30 - 100 ng/mL    Comment: (NOTE) Vitamin D deficiency has been defined by the Institute of Medicine  and an Endocrine Society practice guideline as a level of serum 25-OH  vitamin D less than 20 ng/mL (1,2). The Endocrine Society went on to  further define vitamin  D insufficiency as a level between 21 and 29  ng/mL (2).  1. IOM (Institute of Medicine). 2010. Dietary reference intakes for  calcium and D. [place]: [employer]. 2. Holick MF, Binkley Scraper, Bischoff-Ferrari HA, et al. Evaluation,  treatment, and prevention of vitamin D deficiency: an Endocrine  Society clinical practice guideline, JCEM. 2011 Jul; 96(7): 1911-30.  Performed at [facility], [address], [place], [place] [address]   Vitamin B12     Status: None   Collection Time: 04/22/22  6:21 AM  Result Value Ref Range   Vitamin B-12 183 180 - 914 pg/mL    Comment: (NOTE) This assay is not validated for testing neonatal or myeloproliferative syndrome specimens for Vitamin B12 levels. Performed at [facility], [address], [place], [place] [address]     Blood Alcohol level:  Lab Results  Component Value Date   [facility] <10 04/02/2022   ETH <10 06/05/2017    Metabolic Disorder Labs: Lab Results  Component Value Date   HGBA1C 5.3 04/02/2022   MPG 105 04/02/2022   MPG 103 08/31/2016   Lab Results  Component Value Date   PROLACTIN 28.5 (H) 08/31/2016   Lab Results  Component Value Date   CHOL 201 (H) 04/02/2022   TRIG 207 (H) 04/02/2022   HDL 56 04/02/2022   CHOLHDL 3.6 04/02/2022   VLDL 41 (H) 04/02/2022   LDLCALC 104 (H) 04/02/2022   LDLCALC 76 08/31/2016    Physical Findings: AIMS:  , ,  ,  ,    CIWA:  COWS:     Musculoskeletal: Strength & Muscle Tone: within normal limits Gait & Station: normal Patient leans: N/A  Psychiatric Specialty Exam:  Presentation  General Appearance:  Appropriate for Environment; Casual  Eye Contact: Fair  Speech: Clear and Coherent  Speech Volume: Normal  Handedness: Right   Mood and Affect  Mood: Depressed  Affect: Congruent   Thought Process  Thought Processes: Coherent  Descriptions of Associations:Intact  Orientation:Full (Time, Place and  Person)  Thought Content:Logical  History of Schizophrenia/Schizoaffective disorder:No  Duration of Psychotic Symptoms:N/A  Hallucinations:Hallucinations: None  Ideas of Reference:None  Suicidal Thoughts:Suicidal Thoughts: No  Homicidal Thoughts:Homicidal Thoughts: No   Sensorium  Memory: Immediate Good  Judgment: Fair  Insight: Fair   Art therapist  Concentration: Fair  Attention Span: Fair  Recall: [employer] of Knowledge: Fair  Language: Fair   Psychomotor Activity  Psychomotor Activity: Psychomotor Activity: Normal   Assets  Assets: Resilience   Sleep  Sleep: Sleep: Fair    Physical Exam: Physical Exam Constitutional:      Appearance: Normal appearance.  HENT:     Head: Normocephalic.     Nose: Nose normal.  Eyes:     Pupils: Pupils are equal, round, and reactive to light.  Pulmonary:     Effort: Pulmonary effort is normal.  Musculoskeletal:        General: Normal range of motion.     Cervical back: Normal range of motion.  Neurological:     Mental Status: He is alert and oriented to person, place, and time.  Psychiatric:        Thought Content: Thought content normal.    Review of Systems  Constitutional: Negative.   HENT: Negative.    Eyes: Negative.   Respiratory: Negative.    Cardiovascular: Negative.   Gastrointestinal: Negative.   Genitourinary: Negative.   Musculoskeletal: Negative.   Skin: Negative.   Neurological: Negative.   Psychiatric/Behavioral:  Positive for depression and substance abuse. Negative for hallucinations, memory loss and suicidal ideas. The patient is nervous/anxious and has insomnia.    Blood pressure 137/72, pulse 78, temperature 98.1 F (36.7 C), temperature source Oral, resp. rate 16, height 5\' 7"  (1.702 m), weight 71.2 kg, SpO2 97 %. Body mass index is 24.59 kg/m.  Treatment Plan Summary: Daily contact with patient to assess and evaluate symptoms and progress in treatment and  Medication management   Observation Level/Precautions:  15 minute checks  Laboratory:  Labs reviewed   Psychotherapy:  Unit Group sessions  Medications:  See [facility]  Consultations:  To be determined   Discharge Concerns:  Safety, medication compliance, mood stability  Estimated LOS: 5-7 days  Other:  N/A    Labs Reviewed independently on 04/22/2021: Lipid panel WNL, hemoglobin A1c WNL, TSH WNL, CMP WNL.  CBC with elevated WBCs-Repeatd CBC from 1/5 show CBC WNL at 6.1.  EKG from 12/16 with QTc of 440.  Orders placed for repeat CBC, vitamin D levels low at 16.40, will replenish with vitamin d 50.000 units weekly. vitamin B12 WNL and B1 levels pending. tox screen from the [place] positive for cocaine, other substances of abuse negative.  Patient however reports that he has not used any recreational substances for 4 months.  UA from [place] negative for UTIs   PLAN Safety and Monitoring: Voluntary admission to inpatient psychiatric unit for safety, stabilization and treatment Daily contact with patient to assess and evaluate symptoms and progress in treatment Patient's case to be discussed in multi-disciplinary team meeting  Observation Level : q15 minute checks Vital signs: q12 hours Precautions:Safety   Long Term Goal(s): Improvement in symptoms so as ready for discharge   Short Term Goals: Ability to identify changes in lifestyle to reduce recurrence of condition will improve, Ability to disclose and discuss suicidal ideas, Ability to identify and develop effective coping behaviors will improve, Compliance with prescribed medications will improve, and Ability to identify triggers associated with substance abuse/mental health issues will improve   Diagnoses  Principal Problem:   MDD (major depressive disorder), recurrent severe, without psychosis ([place]) Active Problems:   Cocaine use disorder, severe, dependence ([place])   Insomnia   Social anxiety disorder   PTSD (post-traumatic stress  disorder)   Medications: Home medications restarted by admitting Provider -Continue Vitamin D 50,000 units for vitamin D deficiency -Continue Prazosin 1 mg nightly for nightmares -Discontinued Abilify on admission-as per pt's preference-states medication is ineffective -Increase Seroquel to 100 mg nightly for mood stabilization (states medication was effective in the past in controlling both mood and anxiety.)-Hemoglobin A1c WNL, lipid panel WNL, TSH WNL, EKG with QTc of 442. -Discontinue Seroquel 25 mg twice daily for anxiety and mood stabilization -Discontinued trazodone on admission-patient states medication is not effective for sleep -Continue gabapentin 600 mg 3 times daily for anxiety -Continue hydroxyzine 25 mg 3 times daily as needed -Continue Wellbutrin XL 150 mg for depressive symptoms **Patient educated on rationales, benefits, and possible side effects of all medications.  He verbalizes understanding and is agreeable to trials of all medications as listed above.   Other PRNS -Continue agitation protocol: Zyprexa 10 mg IM or p.o. twice daily as needed -Continue Tylenol 650 mg every 6 hours PRN for mild pain -Continue Maalox 30 mg every 4 hrs PRN for indigestion -Continue Milk of Magnesia as needed every 6 hrs for constipation   Discharge Planning: Social work and case management to assist with discharge planning and identification of hospital follow-up needs prior to discharge Estimated LOS: 5-7 days Discharge Concerns: Need to establish a safety plan; Medication compliance and effectiveness Discharge Goals: Return home with outpatient referrals for mental health follow-up including medication management/psychotherapy   I certify that inpatient services furnished can reasonably be expected to improve the patient's condition.    Nicholes Rough, NP 04/23/2022, 4:47 PMPatient ID: [name], male   DOB: [birth year], 37 y.o.   MRN: [id number]

## 2022-04-23 NOTE — Progress Notes (Signed)
   04/23/22 0000  Psych Admission Type (Psych Patients Only)  Admission Status Voluntary  Psychosocial Assessment  Patient Complaints Anxiety;Depression  Eye Contact Brief  Facial Expression Flat  Affect Depressed  Speech Logical/coherent  Interaction Assertive  Motor Activity Restless  Appearance/Hygiene Unremarkable  Behavior Characteristics Cooperative;Anxious  Mood Anxious  Aggressive Behavior  Effect No apparent injury  Thought Process  Coherency WDL  Content WDL  Delusions None reported or observed  Perception WDL  Hallucination None reported or observed  Judgment Limited  Confusion None  Danger to Self  Current suicidal ideation? Denies  Self-Injurious Behavior No self-injurious ideation or behavior indicators observed or expressed   Description of Agreement verbally contracts  Danger to Others  Danger to Others None reported or observed   Alert/oriented. Makes needs/concerns known to staff. Pleasant cooperative with staff. Denies SI/HI/A/V hallucinations. Med compliant. PRN med given with good effect. Patient states went to group. Will encourage continue compliance and progression towards goals. Verbally contracted for safety. Will continue to monitor.

## 2022-04-23 NOTE — [facility] Group Notes (Signed)
.  Psychoeducational Group Note    Date:  04/23/2022 Time:1300-1400    Purpose of Group: . The group focus' on teaching patients on how to identify their needs and their Life Skills:  A group where two lists are made. What people need and what are things that we do that are unhealthy. The lists are developed by the patients and it is explained that we often do the actions that are not healthy to get our list of needs met.  Goal:: to develop the coping skills needed to get their needs met  Participation Level:  did not attend Additional Comments:    Jaylee Lantry A  

## 2022-04-23 NOTE — Progress Notes (Signed)
Adult Psychoeducational Group Note  Date:  04/23/2022 Time:  9:30 PM  Group Topic/Focus:  Wrap-Up Group:   The focus of this group is to help patients review their daily goal of treatment and discuss progress on daily workbooks.  Participation Level:  Active  Participation Quality:  Appropriate  Affect:  Appropriate  Cognitive:  Appropriate  Insight: Appropriate  Engagement in Group:  Engaged  Modes of Intervention:  Education and Exploration  Additional Comments:  Patient attended and participated in group tonight. He reports that today he learn to try and look at things from another person point of view and don't let anyone steal your joy.  Salley Scarlet [facility] 04/23/2022, 9:30 PM

## 2022-04-23 NOTE — Plan of Care (Signed)
  Problem: Safety: Goal: Ability to remain free from injury will improve Outcome: Progressing   

## 2022-04-23 NOTE — Progress Notes (Signed)
Pt approached nurse today complaining of agitation. Pt standing with fists clenched.  Pt states he is having difficulty reaching parole officer on call and that is root cause of agitation. Pt states hydroxyzine taken this morning had some effect but has worn off. Provider notified, new orders noted. Q 15 minute checks ongoing.

## 2022-04-23 NOTE — Progress Notes (Signed)
Pt complaint with medications. Received atarax for anxiety and ibuprofen for c/o back pain. Pt denies suicidal and homicidal thoughts. Pt denies a/v hallucinations. Pt reports he has phone interviews today as part of discharge planning. Q 15 minute checks ongoing for safety.

## 2022-04-23 NOTE — Group Note (Addendum)
LCSW Group Therapy Note  02/09/2019   10:00-11:00am   Type of Therapy and Topic:  Group Therapy: Anger Analysis Using CBT  Participation Level:  Active   Description of Group:   In this group, patients learned how to recognize the physical, cognitive, emotional, and behavioral responses they have to anger-provoking situations.  They identified a recent time they became angry and how they reacted.  They identified the thoughts they had at the time they last were angry, and how this influenced their subsequent actions.  The group then explored how Cognitive Behavioral analysis can help change outcomes.  Therapeutic Goals: Patients will remember his/her last incident of anger and the surrounding circumstances Patients will identify how they felt emotionally and physically, what their thoughts were at the time, and what actions they took Patients will explore possible changes in their feelings and actions if their thoughts had been analyzed and actually found to be inaccurate or unhelpful.  Summary of Patient Progress:  The patient shared that his frequent source of anger was thinking about his consequences or having to deal with his consequences of the choices he has made.  He will react by exploding on others at times and by imploding on himself at times.  The patient stated he may strike other people or he may relapse, which he states he just did.    Therapeutic Modalities:   Cognitive Behavioral Therapy  Maretta Los, MSW, [place]

## 2022-04-23 NOTE — Progress Notes (Signed)
   04/23/22 2225  Psych Admission Type (Psych Patients Only)  Admission Status Voluntary  Psychosocial Assessment  Patient Complaints Anxiety;Depression  Eye Contact Brief  Facial Expression Flat  Affect Depressed  Speech Logical/coherent  Interaction Assertive  Motor Activity Restless  Appearance/Hygiene Unremarkable  Behavior Characteristics Cooperative  Mood Anxious  Aggressive Behavior  Effect No apparent injury  Thought Process  Coherency WDL  Content WDL  Delusions None reported or observed  Perception WDL  Hallucination None reported or observed  Judgment Limited  Confusion None  Danger to Self  Current suicidal ideation? Denies  Self-Injurious Behavior No self-injurious ideation or behavior indicators observed or expressed   Description of Agreement verbally contracts  Danger to Others  Danger to Others None reported or observed   Alert/oriented. Makes needs/concerns known to staff. Pleasant cooperative with staff. Denies SI/HI/A/V hallucinations. Med compliant. PRN med given with good effect. Patient states went to group. Will encourage continue compliance and progression towards goals. Verbally contracted for safety. Will continue to monitor.

## 2022-04-24 LAB — RESP PANEL BY RT-PCR (RSV, FLU A&B, COVID)  RVPGX2
Influenza A by PCR: NEGATIVE
Influenza B by PCR: NEGATIVE
Resp Syncytial Virus by PCR: NEGATIVE
SARS Coronavirus 2 by RT PCR: NEGATIVE

## 2022-04-24 MED ORDER — QUETIAPINE FUMARATE ER 50 MG PO TB24
50.0000 mg | ORAL_TABLET | Freq: Every day | ORAL | Status: DC
  Administered 2022-04-24 – 2022-04-25 (×2): 50 mg via ORAL
  Filled 2022-04-24 (×5): qty 1

## 2022-04-24 MED ORDER — QUETIAPINE FUMARATE 50 MG PO TABS: 75.0000 mg | ORAL_TABLET | Freq: Every day | ORAL | Status: DC

## 2022-04-24 MED ORDER — PRAZOSIN HCL 1 MG PO CAPS
1.0000 mg | ORAL_CAPSULE | Freq: Every day | ORAL | Status: DC
  Administered 2022-04-24 – 2022-04-25 (×2): 1 mg via ORAL
  Filled 2022-04-24 (×4): qty 1

## 2022-04-24 MED ORDER — OLANZAPINE 10 MG IM SOLR: 5.0000 mg | Freq: Two times a day (BID) | INTRAMUSCULAR | Status: DC | PRN

## 2022-04-24 MED ORDER — QUETIAPINE FUMARATE 50 MG PO TABS
75.0000 mg | ORAL_TABLET | Freq: Every day | ORAL | Status: DC
  Administered 2022-04-24: 75 mg via ORAL
  Filled 2022-04-24 (×3): qty 1

## 2022-04-24 MED ORDER — OLANZAPINE 5 MG PO TBDP
5.0000 mg | ORAL_TABLET | Freq: Two times a day (BID) | ORAL | Status: DC | PRN
  Administered 2022-04-25 – 2022-04-28 (×5): 5 mg via ORAL
  Filled 2022-04-24 (×5): qty 1

## 2022-04-24 MED ORDER — MENTHOL 3 MG MT LOZG
1.0000 | LOZENGE | OROMUCOSAL | Status: DC | PRN
  Administered 2022-04-24 – 2022-04-25 (×4): 3 mg via ORAL
  Filled 2022-04-24 (×2): qty 9

## 2022-04-24 NOTE — Progress Notes (Signed)
Pt medication compliant. Pt endorses feeling anxious and agitated at times. Pt made phone calls to half way houses this morning. Pt pacing hallway at times. Utilizing prn hydroxyzine. Q 15 minute checks ongoing for safety.

## 2022-04-24 NOTE — Progress Notes (Signed)
[facility] MD Progress Note  04/24/2022 1:21 PM Theodore Carr  MRN:  [id number] Principal Problem: MDD (major depressive disorder), recurrent severe, without psychosis ([place]) Diagnosis: Principal Problem:   MDD (major depressive disorder), recurrent severe, without psychosis ([place]) Active Problems:   Cocaine use disorder, severe, dependence ([place])   Insomnia   Social anxiety disorder   PTSD (post-traumatic stress disorder)  Reason For Admission: Theodore Carr is a 37 yo Caucasian male with a self reported past mental health history of MDD, bipolar 2 d/o, social anxiety, PTSD, and paranoia, who presented to the [facility] with complaints of worsening suicide thoughts with plan to hang himself and homicidal ideations towards his room mate. Pt was transferred voluntarily to this [facility] for treatment and stabilization of his mental status.   24 hr chart review: BP with periods of slight elevations over the past 24 hrs. DBP in the 90s. Pt has been medication compliant, but has also exhibited some med seeking type behaviors over the past 24 hrs. Required Zyprexa 10 mg earlier yesterday morning for agitation. Also has been requiring Hydroxyzine 50 mg frequently for anxiety.  Patient assessment note, 04/24/2021: Mood is depressed, affect is congruent. Pt rates depression today as 10, 10 being worst. He rates his anxiety as 8, 10 being worst. He states that "my depression is mostly frustration, of not knowing where I'm going to go after I leave from here." Pt educated on the need to call from the shelter resource list provided to him by CSW, and states that he made some calls yesterday, and has interviews tomorrow. He states that the interviews are for [place] houses. Positive reinforcements given to pt to keep calling. Pt denies SI, denies HI, denies AVH, denies paranoia. Thoughts are logical, speech is clear and organized, there is no evidence of delusional thinking.  Pt's attention to personal hygiene and  grooming is fair, eye contact is good, speech is clear & coherent. Thought contents are organized and logical, and pt currently denies SI/HI/AVH or paranoia. There is no evidence of delusional thoughts.    Patient is currently on Seroquel immediate release 100 mg nightly for sleep and mood stabilization.  During the day for the past couple of days, patient has come to the nurses station, and stated to his nurse that he is "agitated", and wants to be given Zyprexa.  He has exhibited some med seeking behaviors.  Patient has been educated, that the nurse makes determination as to if he meets criteria for any agitation protocol medication.  Patient asked what the criteria is, and Probation officer explained to him that criteria will not be explained to him, but he does not meet criteria to get it at this time.  Patient responded "so if I go into my room and start flipping shit up will I get it?Insurance underwriter explained to him that this is unacceptable behavior, and that being disruptive to the unit might result in consequences including being discharged.  Patient educated on healthy coping mechanisms for his anxiety, including journaling, drawing, and walking in his room.  Patient states he feels frustrated because he has to stay in his room due to isolation precautions that are being followed on [address] related to the new outbreak and the flu on that hall.  He is agreeable to take hydroxyzine 50 mg at this time, with no further intervention required.  We are decreasing nighttime dose of Seroquel immediate release to 75 mg nightly, and starting Seroquel extended release 50 mg in the  mornings for mood stabilization.  We are continuing all other medications as listed below.  Agitation protocol medication reduced from olanzapine 10 mg as needed to olanzapine 5 mg as needed.  Total Time spent with patient: 45 minutes  Past Psychiatric History: See H & P  Past Medical History:  Past Medical History:  Diagnosis Date    Compression fracture of T12 vertebra (HCC) 2008   Depression    Hypertension    History reviewed. No pertinent surgical history. Family History:  Family History  Problem Relation Age of Onset   Drug abuse Cousin    Family Psychiatric  History: See H & P Social History:  Social History   Substance and Sexual Activity  Alcohol Use Yes   Alcohol/week: 2.0 standard drinks of alcohol   Types: 2 Cans of beer per week   Comment: last used 4 days ago in prison     Social History   Substance and Sexual Activity  Drug Use Yes   Frequency: 2.0 times per week   Types: Cocaine, Marijuana   Comment: last used 4 days ago    Social History   Socioeconomic History   Marital status: Single    Spouse name: Not on file   Number of children: Not on file   Years of education: 12   Highest education level: GED or equivalent  Occupational History   Not on file  Tobacco Use   Smoking status: Former    Packs/day: 1.00    Years: 15.00    Total pack years: 15.00    Types: Cigarettes    Quit date: 03/18/2022    Years since quitting: 0.1   Smokeless tobacco: Never  Vaping Use   Vaping Use: Never used  Substance and Sexual Activity   Alcohol use: Yes    Alcohol/week: 2.0 standard drinks of alcohol    Types: 2 Cans of beer per week    Comment: last used 4 days ago in prison   Drug use: Yes    Frequency: 2.0 times per week    Types: Cocaine, Marijuana    Comment: last used 4 days ago   Sexual activity: Yes  Other Topics Concern   Not on file  Social History Narrative   Not on file   Social Determinants of Health   Financial Resource Strain: Not on file  Food Insecurity: No Food Insecurity (04/20/2022)   Hunger Vital Sign    Worried About Running Out of Food in the Last Year: Never true    Ran Out of Food in the Last Year: Never true  Transportation Needs: Unknown (04/20/2022)   PRAPARE - Administrator, Civil ServiceTransportation    Lack of Transportation (Medical): Patient refused    Lack of Transportation  (Non-Medical): Patient refused  Physical Activity: Not on file  Stress: Not on file  Social Connections: Not on file   Sleep: Poor  Appetite:  Fair  Current Medications: Current Facility-Administered Medications  Medication Dose Route Frequency Provider Last Rate Last Admin   acetaminophen (TYLENOL) tablet 650 mg  650 mg Oral Q6H PRN Lenard LanceAllen, Tina L, FNP   650 mg at 04/21/22 0820   alum & mag hydroxide-simeth (MAALOX/MYLANTA) 200-200-20 MG/5ML suspension 30 mL  30 mL Oral Q4H PRN Lenard LanceAllen, Tina L, FNP       buPROPion (WELLBUTRIN XL) 24 hr tablet 150 mg  150 mg Oral Daily Lenard LanceAllen, Tina L, FNP   150 mg at 04/24/22 0737   fenofibrate tablet 160 mg  160 mg Oral Daily  Starleen Blue, NP   160 mg at 04/24/22 7824   gabapentin (NEURONTIN) tablet 600 mg  600 mg Oral TID Lenard Lance, FNP   600 mg at 04/24/22 1132   hydrOXYzine (ATARAX) tablet 50 mg  50 mg Oral Q6H PRN Starleen Blue, NP   50 mg at 04/24/22 1232   ibuprofen (ADVIL) tablet 600 mg  600 mg Oral Q6H PRN Starleen Blue, NP   600 mg at 04/24/22 2353   magnesium hydroxide (MILK OF MAGNESIA) suspension 30 mL  30 mL Oral Daily PRN Lenard Lance, FNP       OLANZapine zydis (ZYPREXA) disintegrating tablet 5 mg  5 mg Oral BID PRN Starleen Blue, NP       Or   OLANZapine (ZYPREXA) injection 5 mg  5 mg Intramuscular BID PRN Starleen Blue, NP       prazosin (MINIPRESS) capsule 1 mg  1 mg Oral QHS Kaylem Gidney, NP   1 mg at 04/23/22 2108   QUEtiapine (SEROQUEL XR) 24 hr tablet 50 mg  50 mg Oral Daily Halah Whiteside, NP       QUEtiapine (SEROQUEL) tablet 75 mg  75 mg Oral QHS Avacyn Kloosterman, NP       Vitamin D (Ergocalciferol) (DRISDOL) 1.25 MG (50000 UNIT) capsule 50,000 Units  50,000 Units Oral Q7 days Starleen Blue, NP   50,000 Units at 04/22/22 1555    Lab Results:  Results for orders placed or performed during the hospital encounter of 04/20/22 (from the past 48 hour(s))  Resp panel by RT-PCR (RSV, Flu A&B, Covid) Anterior Nasal Swab      Status: None   Collection Time: 04/24/22 10:16 AM   Specimen: Anterior Nasal Swab  Result Value Ref Range   SARS Coronavirus 2 by RT PCR NEGATIVE NEGATIVE    Comment: (NOTE) SARS-CoV-2 target nucleic acids are NOT DETECTED.  The SARS-CoV-2 RNA is generally detectable in upper respiratory specimens during the acute phase of infection. The lowest concentration of SARS-CoV-2 viral copies this assay can detect is 138 copies/mL. A negative result does not preclude SARS-Cov-2 infection and should not be used as the sole basis for treatment or other patient management decisions. A negative result may occur with  improper specimen collection/handling, submission of specimen other than nasopharyngeal swab, presence of viral mutation(s) within the areas targeted by this assay, and inadequate number of viral copies(<138 copies/mL). A negative result must be combined with clinical observations, patient history, and epidemiological information. The expected result is Negative.  Fact Sheet for Patients:  BloggerCourse.com  Fact Sheet for Healthcare Providers:  SeriousBroker.it  This test is no t yet approved or cleared by the [place] FDA and  has been authorized for detection and/or diagnosis of SARS-CoV-2 by FDA under an Emergency Use Authorization (EUA). This EUA will remain  in effect (meaning this test can be used) for the duration of the COVID-19 declaration under Section 564(b)(1) of the Act, 21 U.S.C.section 360bbb-3(b)(1), unless the authorization is terminated  or revoked sooner.       Influenza A by PCR NEGATIVE NEGATIVE   Influenza B by PCR NEGATIVE NEGATIVE    Comment: (NOTE) The Xpert Xpress SARS-CoV-2/FLU/RSV plus assay is intended as an aid in the diagnosis of influenza from Nasopharyngeal swab specimens and should not be used as a sole basis for treatment. Nasal washings and aspirates are unacceptable for Xpert Xpress  SARS-CoV-2/FLU/RSV testing.  Fact Sheet for Patients: BloggerCourse.com  Fact Sheet for Healthcare Providers: SeriousBroker.it  This test is not yet approved or cleared by the [place] and has been authorized for detection and/or diagnosis of SARS-CoV-2 by FDA under an Emergency Use Authorization (EUA). This EUA will remain in effect (meaning this test can be used) for the duration of the COVID-19 declaration under Section 564(b)(1) of the Act, 21 U.S.C. section 360bbb-3(b)(1), unless the authorization is terminated or revoked.     Resp Syncytial Virus by PCR NEGATIVE NEGATIVE    Comment: (NOTE) Fact Sheet for Patients: BloggerCourse.com  Fact Sheet for Healthcare Providers: SeriousBroker.it  This test is not yet approved or cleared by the [place] FDA and has been authorized for detection and/or diagnosis of SARS-CoV-2 by FDA under an Emergency Use Authorization (EUA). This EUA will remain in effect (meaning this test can be used) for the duration of the COVID-19 declaration under Section 564(b)(1) of the Act, 21 U.S.C. section 360bbb-3(b)(1), unless the authorization is terminated or revoked.  Performed at [facility], [address], [place], [place] [address]     Blood Alcohol level:  Lab Results  Component Value Date   ETH <10 04/02/2022   ETH <10 06/05/2017    Metabolic Disorder Labs: Lab Results  Component Value Date   HGBA1C 5.3 04/02/2022   MPG 105 04/02/2022   MPG 103 08/31/2016   Lab Results  Component Value Date   PROLACTIN 28.5 (H) 08/31/2016   Lab Results  Component Value Date   CHOL 201 (H) 04/02/2022   TRIG 207 (H) 04/02/2022   HDL 56 04/02/2022   CHOLHDL 3.6 04/02/2022   VLDL 41 (H) 04/02/2022   LDLCALC 104 (H) 04/02/2022   LDLCALC 76 08/31/2016   Physical Findings: AIMS: 0 CIWA: n/a  COWS: n/a     Musculoskeletal: Strength & Muscle Tone: within normal limits Gait & Station: normal Patient leans: N/A  Psychiatric Specialty Exam:  Presentation  General Appearance:  Appropriate for Environment; Casual  Eye Contact: Good  Speech: Clear and Coherent  Speech Volume: Normal  Handedness: Right   Mood and Affect  Mood: Euthymic  Affect: Congruent   Thought Process  Thought Processes: Coherent  Descriptions of Associations:Intact  Orientation:Full (Time, Place and Person)  Thought Content:Logical  History of Schizophrenia/Schizoaffective disorder:No  Duration of Psychotic Symptoms:N/A  Hallucinations:Hallucinations: None  Ideas of Reference:None  Suicidal Thoughts:Suicidal Thoughts: No  Homicidal Thoughts:Homicidal Thoughts: No   Sensorium  Memory: Immediate Good  Judgment: Fair  Insight: Fair   Art therapist  Concentration: Fair  Attention Span: Fair  Recall: [employer] of Knowledge: Fair  Language: Fair   Psychomotor Activity  Psychomotor Activity: Psychomotor Activity: Normal   Assets  Assets: Communication Skills; Resilience   Sleep  Sleep: Sleep: Good    Physical Exam: Physical Exam Constitutional:      Appearance: Normal appearance.  HENT:     Head: Normocephalic.     Nose: Nose normal.  Eyes:     Pupils: Pupils are equal, round, and reactive to light.  Pulmonary:     Effort: Pulmonary effort is normal.  Musculoskeletal:        General: Normal range of motion.     Cervical back: Normal range of motion.  Neurological:     Mental Status: He is alert and oriented to person, place, and time.  Psychiatric:        Thought Content: Thought content normal.    Review of Systems  Constitutional: Negative.   HENT: Negative.    Eyes: Negative.  Respiratory: Negative.    Cardiovascular: Negative.   Gastrointestinal: Negative.   Genitourinary: Negative.   Musculoskeletal: Negative.   Skin:  Negative.   Neurological: Negative.   Psychiatric/Behavioral:  Positive for depression and substance abuse. Negative for hallucinations, memory loss and suicidal ideas. The patient is nervous/anxious and has insomnia.    Blood pressure (!) 117/93, pulse (!) 111, temperature 98 F (36.7 C), temperature source Oral, resp. rate 16, height 5\' 7"  (1.702 m), weight 71.2 kg, SpO2 98 %. Body mass index is 24.59 kg/m.  Treatment Plan Summary: Daily contact with patient to assess and evaluate symptoms and progress in treatment and Medication management   Observation Level/Precautions:  15 minute checks  Laboratory:  Labs reviewed   Psychotherapy:  Unit Group sessions  Medications:  See [facility]  Consultations:  To be determined   Discharge Concerns:  Safety, medication compliance, mood stability  Estimated LOS: 5-7 days  Other:  N/A    Labs Reviewed independently on 04/23/2021: Lipid panel WNL, hemoglobin A1c WNL, TSH WNL, CMP WNL.  CBC with elevated WBCs-Repeatd CBC from 1/5 show CBC WNL at 6.1.  EKG from 12/16 with QTc of 440. vitamin D levels low at 16.40, will replenish with vitamin d 50.000 units weekly. vitamin B12 WNL and B1 levels pending. tox screen from the [facility] [place] positive for cocaine, other substances of abuse negative.  Patient however reports that he has not used any recreational substances for 4 months.  UA from [facility] negative for UTIs   PLAN Safety and Monitoring: Voluntary admission to inpatient psychiatric unit for safety, stabilization and treatment Daily contact with patient to assess and evaluate symptoms and progress in treatment Patient's case to be discussed in multi-disciplinary team meeting Observation Level : q15 minute checks Vital signs: q12 hours Precautions:Safety   Long Term Goal(s): Improvement in symptoms so as ready for discharge   Short Term Goals: Ability to identify changes in lifestyle to reduce recurrence of condition will improve, Ability to disclose and  discuss suicidal ideas, Ability to identify and develop effective coping behaviors will improve, Compliance with prescribed medications will improve, and Ability to identify triggers associated with substance abuse/mental health issues will improve   Diagnoses  Principal Problem:   MDD (major depressive disorder), recurrent severe, without psychosis (HCC) Active Problems:   Cocaine use disorder, severe, dependence (HCC)   Insomnia   Social anxiety disorder   PTSD (post-traumatic stress disorder)   Medications: Home medications restarted by admitting Provider -Start Seroquel XL 50 mg daily in the mornings for mood stabilization -Continue Vitamin D 50,000 units for vitamin D deficiency -Continue Prazosin 1 mg nightly for nightmares -Discontinued Abilify on admission-as per pt's preference-states medication is ineffective -Decrease Seroquel IR from 100 mg to 75 mg nightly for mood stabilization (states medication was effective in the past in controlling both mood and anxiety.)-Hemoglobin A1c WNL, lipid panel WNL, TSH WNL, EKG with QTc of 442. -Discontinued trazodone on admission-patient states medication is not effective for sleep -Continue gabapentin 600 mg 3 times daily for anxiety -Continue hydroxyzine 25 mg 3 times daily as needed -Continue Wellbutrin XL 150 mg for depressive symptoms **Patient educated on rationales, benefits, and possible side effects of all medications.  He verbalizes understanding and is agreeable to trials of all medications as listed above.   Other PRNS -Continue agitation protocol: Decrease Zyprexa 10 mg IM or p.o. to Zyprexa 5 mg PO/IM for agitation twice daily as needed -Continue Tylenol 650 mg every 6 hours PRN for mild pain -  Continue Maalox 30 mg every 4 hrs PRN for indigestion -Continue Milk of Magnesia as needed every 6 hrs for constipation   Discharge Planning: Social work and case management to assist with discharge planning and identification of  hospital follow-up needs prior to discharge Estimated LOS: 5-7 days Discharge Concerns: Need to establish a safety plan; Medication compliance and effectiveness Discharge Goals: Return home with outpatient referrals for mental health follow-up including medication management/psychotherapy   I certify that inpatient services furnished can reasonably be expected to improve the patient's condition.    Starleen Blue, NP 04/24/2022, 1:21 PMPatient ID: Theodore Carr, male   DOB: March 16, 1986, 37 y.o.   MRN: [id number] Patient ID: Theodore Carr, male   DOB: [birth year], 37 y.o.   MRN: [id number]

## 2022-04-25 ENCOUNTER — Encounter (HOSPITAL_COMMUNITY): Payer: Self-pay

## 2022-04-25 LAB — RESP PANEL BY RT-PCR (RSV, FLU A&B, COVID)  RVPGX2
Influenza A by PCR: NEGATIVE
Influenza B by PCR: NEGATIVE
Resp Syncytial Virus by PCR: NEGATIVE
SARS Coronavirus 2 by RT PCR: NEGATIVE

## 2022-04-25 LAB — HEPATITIS PANEL, ACUTE
HCV Ab: NONREACTIVE
Hep A IgM: NONREACTIVE
Hep B C IgM: NONREACTIVE
Hepatitis B Surface Ag: NONREACTIVE

## 2022-04-25 LAB — VITAMIN B1: Vitamin B1 (Thiamine): 131.5 nmol/L (ref 66.5–200.0)

## 2022-04-25 LAB — HIV ANTIBODY (ROUTINE TESTING W REFLEX): HIV Screen 4th Generation wRfx: NONREACTIVE

## 2022-04-25 MED ORDER — QUETIAPINE FUMARATE 25 MG PO TABS
25.0000 mg | ORAL_TABLET | Freq: Every day | ORAL | Status: DC
  Filled 2022-04-25 (×2): qty 1

## 2022-04-25 MED ORDER — GABAPENTIN 300 MG PO CAPS
300.0000 mg | ORAL_CAPSULE | Freq: Three times a day (TID) | ORAL | Status: DC
  Administered 2022-04-26 (×2): 300 mg via ORAL
  Filled 2022-04-25 (×8): qty 1

## 2022-04-25 MED ORDER — GABAPENTIN 400 MG PO CAPS
400.0000 mg | ORAL_CAPSULE | Freq: Three times a day (TID) | ORAL | Status: AC
  Administered 2022-04-25: 400 mg via ORAL
  Filled 2022-04-25: qty 1

## 2022-04-25 MED ORDER — GABAPENTIN 400 MG PO CAPS
400.0000 mg | ORAL_CAPSULE | Freq: Three times a day (TID) | ORAL | Status: DC
  Filled 2022-04-25 (×3): qty 1

## 2022-04-25 MED ORDER — QUETIAPINE FUMARATE 50 MG PO TABS
50.0000 mg | ORAL_TABLET | Freq: Every day | ORAL | Status: AC
  Administered 2022-04-25: 50 mg via ORAL
  Filled 2022-04-25: qty 1

## 2022-04-25 MED ORDER — MELATONIN 5 MG PO TABS
5.0000 mg | ORAL_TABLET | Freq: Every day | ORAL | Status: DC
  Administered 2022-04-25: 5 mg via ORAL
  Filled 2022-04-25 (×4): qty 1

## 2022-04-25 MED ORDER — HYDROXYZINE HCL 50 MG PO TABS
50.0000 mg | ORAL_TABLET | Freq: Three times a day (TID) | ORAL | Status: DC
  Administered 2022-04-25 – 2022-04-26 (×3): 50 mg via ORAL
  Filled 2022-04-25 (×10): qty 1

## 2022-04-25 NOTE — BH IP Treatment Plan (Signed)
Interdisciplinary Treatment and Diagnostic Plan Update  04/25/2022 Time of Session: 9:20 AM ( UPDATE) Theodore Carr MRN: [id number]  Principal Diagnosis: MDD (major depressive disorder), recurrent severe, without psychosis (HCC)  Secondary Diagnoses: Principal Problem:   MDD (major depressive disorder), recurrent severe, without psychosis (HCC) Active Problems:   Cocaine use disorder, severe, dependence (HCC)   Insomnia   Social anxiety disorder   PTSD (post-traumatic stress disorder)   Current Medications:  Current Facility-Administered Medications  Medication Dose Route Frequency Provider Last Rate Last Admin   acetaminophen (TYLENOL) tablet 650 mg  650 mg Oral Q6H PRN Lenard Lance, FNP   650 mg at 04/21/22 0820   alum & mag hydroxide-simeth (MAALOX/MYLANTA) 200-200-20 MG/5ML suspension 30 mL  30 mL Oral Q4H PRN Lenard Lance, FNP       buPROPion (WELLBUTRIN XL) 24 hr tablet 150 mg  150 mg Oral Daily Lenard Lance, FNP   150 mg at 04/25/22 0750   fenofibrate tablet 160 mg  160 mg Oral Daily Starleen Blue, NP   160 mg at 04/25/22 0750   gabapentin (NEURONTIN) tablet 600 mg  600 mg Oral TID Lenard Lance, FNP   600 mg at 04/25/22 1201   hydrOXYzine (ATARAX) tablet 50 mg  50 mg Oral Q6H PRN Starleen Blue, NP   50 mg at 04/25/22 1333   ibuprofen (ADVIL) tablet 600 mg  600 mg Oral Q6H PRN Starleen Blue, NP   600 mg at 04/25/22 4098   magnesium hydroxide (MILK OF MAGNESIA) suspension 30 mL  30 mL Oral Daily PRN Lenard Lance, FNP       menthol-cetylpyridinium (CEPACOL) lozenge 3 mg  1 lozenge Oral PRN Starleen Blue, NP   3 mg at 04/25/22 1333   OLANZapine zydis (ZYPREXA) disintegrating tablet 5 mg  5 mg Oral BID PRN Starleen Blue, NP       Or   OLANZapine (ZYPREXA) injection 5 mg  5 mg Intramuscular BID PRN Starleen Blue, NP       prazosin (MINIPRESS) capsule 1 mg  1 mg Oral QHS Nkwenti, Doris, NP   1 mg at 04/24/22 2016   QUEtiapine (SEROQUEL XR) 24 hr tablet 50 mg  50 mg Oral Daily  Nkwenti, Tyler Aas, NP   50 mg at 04/25/22 0750   QUEtiapine (SEROQUEL) tablet 75 mg  75 mg Oral QHS Starleen Blue, NP   75 mg at 04/24/22 2016   Vitamin D (Ergocalciferol) (DRISDOL) 1.25 MG (50000 UNIT) capsule 50,000 Units  50,000 Units Oral Q7 days Starleen Blue, NP   50,000 Units at 04/22/22 1555   PTA Medications: Medications Prior to Admission  Medication Sig Dispense Refill Last Dose   fenofibrate (TRICOR) 145 MG tablet Take 145 mg by mouth daily.      ibuprofen (ADVIL) 600 MG tablet Take 600 mg by mouth every 8 (eight) hours as needed.      ARIPiprazole (ABILIFY) 10 MG tablet Take 1 tablet (10 mg total) by mouth daily. 30 tablet 0    buPROPion (WELLBUTRIN XL) 150 MG 24 hr tablet Take 1 tablet (150 mg total) by mouth daily. 30 tablet 0    gabapentin (NEURONTIN) 300 MG capsule Take 2 capsules (600 mg total) by mouth 3 (three) times daily. 180 capsule 0    hydrOXYzine (ATARAX) 25 MG tablet Take 1 tablet (25 mg total) by mouth 3 (three) times daily as needed for anxiety. 60 tablet 0    traZODone (DESYREL) 50 MG tablet Take 1 tablet (  50 mg total) by mouth at bedtime as needed for sleep. 30 tablet 0     Patient Stressors:    Patient Strengths:    Treatment Modalities: Medication Management, Group therapy, Case management,  1 to 1 session with clinician, Psychoeducation, Recreational therapy.   Physician Treatment Plan for Primary Diagnosis: MDD (major depressive disorder), recurrent severe, without psychosis (HCC) Long Term Goal(s): Improvement in symptoms so as ready for discharge   Short Term Goals: Ability to identify changes in lifestyle to reduce recurrence of condition will improve Ability to disclose and discuss suicidal ideas Ability to identify and develop effective coping behaviors will improve Compliance with prescribed medications will improve Ability to identify triggers associated with substance abuse/mental health issues will improve  Medication Management: Evaluate  patient's response, side effects, and tolerance of medication regimen.  Therapeutic Interventions: 1 to 1 sessions, Unit Group sessions and Medication administration.  Evaluation of Outcomes: Progressing  Physician Treatment Plan for Secondary Diagnosis: Principal Problem:   MDD (major depressive disorder), recurrent severe, without psychosis (HCC) Active Problems:   Cocaine use disorder, severe, dependence (HCC)   Insomnia   Social anxiety disorder   PTSD (post-traumatic stress disorder)  Long Term Goal(s): Improvement in symptoms so as ready for discharge   Short Term Goals: Ability to identify changes in lifestyle to reduce recurrence of condition will improve Ability to disclose and discuss suicidal ideas Ability to identify and develop effective coping behaviors will improve Compliance with prescribed medications will improve Ability to identify triggers associated with substance abuse/mental health issues will improve     Medication Management: Evaluate patient's response, side effects, and tolerance of medication regimen.  Therapeutic Interventions: 1 to 1 sessions, Unit Group sessions and Medication administration.  Evaluation of Outcomes: Progressing   RN Treatment Plan for Primary Diagnosis: MDD (major depressive disorder), recurrent severe, without psychosis (HCC) Long Term Goal(s): Knowledge of disease and therapeutic regimen to maintain health will improve  Short Term Goals: Ability to remain free from injury will improve, Ability to verbalize frustration and anger appropriately will improve, Ability to participate in decision making will improve, Ability to verbalize feelings will improve, Ability to identify and develop effective coping behaviors will improve, and Compliance with prescribed medications will improve  Medication Management: RN will administer medications as ordered by provider, will assess and evaluate patient's response and provide education to patient  for prescribed medication. RN will report any adverse and/or side effects to prescribing provider.  Therapeutic Interventions: 1 on 1 counseling sessions, Psychoeducation, Medication administration, Evaluate responses to treatment, Monitor vital signs and CBGs as ordered, Perform/monitor CIWA, COWS, AIMS and Fall Risk screenings as ordered, Perform wound care treatments as ordered.  Evaluation of Outcomes: Progressing   LCSW Treatment Plan for Primary Diagnosis: MDD (major depressive disorder), recurrent severe, without psychosis (HCC) Long Term Goal(s): Safe transition to appropriate next level of care at discharge, Engage patient in therapeutic group addressing interpersonal concerns.  Short Term Goals: Engage patient in aftercare planning with referrals and resources, Increase social support, Increase emotional regulation, Facilitate acceptance of mental health diagnosis and concerns, Identify triggers associated with mental health/substance abuse issues, and Increase skills for wellness and recovery  Therapeutic Interventions: Assess for all discharge needs, 1 to 1 time with Social worker, Explore available resources and support systems, Assess for adequacy in community support network, Educate family and significant other(s) on suicide prevention, Complete Psychosocial Assessment, Interpersonal group therapy.  Evaluation of Outcomes: Progressing Progress in Treatment: Attending groups: No. Participating in  groups: No. Taking medication as prescribed: No. Toleration medication: Yes. Family/Significant other contact made: Yes, individual(s) contacted:  [phone] (new Research officer, trade union) Patient understands diagnosis: Yes. Discussing patient identified problems/goals with staff: Yes. Medical problems stabilized or resolved: Yes. Denies suicidal/homicidal ideation: Yes. Issues/concerns per patient self-inventory: Yes. Other:    New problem(s) identified: None reported   New Short  Term/Long Term Goal(s): medication stabilization, elimination of SI thoughts, development of comprehensive mental wellness plan.     Patient Goals:  Coping Skills   Discharge Plan or Barriers: None Reported. CSW will continue to follow and assess for appropriate referrals and possible discharge planning.     Reason for Continuation of Hospitalization: Anxiety Depression Medication stabilization Suicidal ideation Withdrawal symptoms   Estimated Length of Stay: 3-7 Days   Last 3 [place] Suicide Severity Risk Score: [place] Admission (Current) from 04/20/2022 in [facility] Admission (Discharged) from 04/03/2022 in [facility] ED from 04/02/2022 in [facility] High Risk High Risk No Risk       Last PHQ 2/9 Scores:    04/02/2022    6:33 PM 02/11/2015   10:00 AM  Depression screen PHQ 2/9  Decreased Interest 1 0  Down, Depressed, Hopeless 2 1  PHQ - 2 Score 3 1  Altered sleeping 1   Tired, decreased energy 1   Change in appetite 1   Feeling bad or failure about yourself  2   Trouble concentrating 1   Moving slowly or fidgety/restless 0   Suicidal thoughts 2   PHQ-9 Score 11   Difficult doing work/chores Very difficult     Scribe for Treatment Team: Charlett Lango 04/25/2022 1:37 PM

## 2022-04-25 NOTE — BHH Counselor (Addendum)
CSW spoke with the Pt who states that he has contacted several [facility] and they have denied him "due to my previous behaviors or they have no beds available".  The Pt states that he also contacted [facility] and was informed that there were 7 admissions over the weekend and that there are no available beds at this time.  The Pt requested the number to [facility] in [place].  CSW provided the Pt with this phone number to contact today. CSW will follow-up with the Pt to see if any additional information is needed.   CSW attempted to contact the Pt's Research officer, trade union, Mrs. Rowe Robert [phone] (Office) and [phone] (Cell) to speak with her about possible housing arrangement for the Pt.  CSW was unable to receive an answer from either phone number.  CSW left a HIPAA compliant voicemail asking for a return call (04/25/22 at 9:24am).   CSW spoke with the Pt who states that he contacted [facility] and can be accepted into the program once the providers have documented negative results for Covid/Flu, Hep B & C, AIDS, and TB.  CSW has spoken with the provider to begin ordering these tests for the Pt.  It was explained to the Pt that the results of these tests could take up to 3 days to be received and that discharge would occur after that.  The Pt expressed understanding of this information.

## 2022-04-25 NOTE — Plan of Care (Signed)
  Problem: Safety: Goal: Ability to remain free from injury will improve Outcome: Progressing   Problem: Coping: Goal: Ability to verbalize frustrations and anger appropriately will improve Outcome: Progressing   Problem: Coping: Goal: Ability to demonstrate self-control will improve Outcome: Progressing

## 2022-04-25 NOTE — Progress Notes (Signed)
   04/24/22 2100  Psych Admission Type (Psych Patients Only)  Admission Status Voluntary  Psychosocial Assessment  Patient Complaints Anxiety  Eye Contact Fair  Facial Expression Animated  Affect Blunted  Speech Logical/coherent  Interaction Assertive  Motor Activity Pacing  Appearance/Hygiene Unremarkable  Behavior Characteristics Cooperative  Mood Anxious  Thought Process  Coherency WDL  Content WDL  Delusions None reported or observed  Perception WDL  Hallucination None reported or observed  Judgment Poor  Confusion None  Danger to Self  Current suicidal ideation? Denies  Self-Injurious Behavior No self-injurious ideation or behavior indicators observed or expressed   Agreement Not to Harm Self Yes  Description of Agreement verbal  Danger to Others  Danger to Others None reported or observed

## 2022-04-25 NOTE — [facility] Group Notes (Signed)
Pt did not attend AA group  

## 2022-04-25 NOTE — Progress Notes (Signed)
   04/25/22 0836  Psych Admission Type (Psych Patients Only)  Admission Status Voluntary  Psychosocial Assessment  Patient Complaints Anxiety  Eye Contact Fair  Facial Expression Animated  Affect Flat  Speech Logical/coherent  Interaction Assertive  Motor Activity Other (Comment) (WNL)  Appearance/Hygiene Unremarkable  Behavior Characteristics Cooperative  Mood Anxious  Thought Process  Coherency WDL  Content WDL  Delusions None reported or observed  Perception WDL  Hallucination None reported or observed  Judgment Poor  Confusion None  Danger to Self  Current suicidal ideation? Denies  Self-Injurious Behavior No self-injurious ideation or behavior indicators observed or expressed   Agreement Not to Harm Self Yes  Description of Agreement verbal  Danger to Others  Danger to Others None reported or observed

## 2022-04-25 NOTE — Group Note (Signed)
Recreation Therapy Group Note   Group Topic:Stress Management  Group Date: 04/25/2022 Start Time: 0935 End Time: 0955 Facilitators: Seddrick Flax-McCall, LRT,CTRS Location: [address] Dayroom   Goal Area(s) Addresses:  Patient will identify positive stress management techniques. Patient will identify benefits of using stress management post d/c.  Group Description:  Meditation.  LRT played a meditation for the patients that focused on having a fresh new start to your day.  Patients were to listen and follow along as meditation played to fully encompass what the meditation is saying.   Affect/Mood: N/A   Participation Level: Did not attend    Clinical Observations/Individualized Feedback:     Plan: Continue to engage patient in RT group sessions 2-3x/week.   Theodore Carr, LRT,CTRS 04/25/2022 12:56 PM

## 2022-04-25 NOTE — Progress Notes (Signed)
[facility] MD Progress Note  04/25/2022 5:07 PM Lugene Beougher  MRN:  [id number] Principal Problem: MDD (major depressive disorder), recurrent severe, without psychosis (HCC) Diagnosis: Principal Problem:   MDD (major depressive disorder), recurrent severe, without psychosis (HCC) Active Problems:   Cocaine use disorder, severe, dependence (HCC)   Insomnia   Social anxiety disorder   PTSD (post-traumatic stress disorder)  Reason For Admission: Theodore Carr is a 37 yo Caucasian male with a self reported past mental health history of MDD, bipolar 2 d/o, social anxiety, PTSD, and paranoia, who presented to the [facility] health, [place] with complaints of worsening suicide thoughts with plan to hang himself and homicidal ideations towards his room mate. Pt was transferred voluntarily to this [facility] for treatment and stabilization of his mental status.   24 hr chart review: BP with periods of slight elevations over the past 24 hrs. DBP in the 90s. Pt has been medication compliant.  As needed medications in the past 24 hours have consisted of hydroxyzine 50 mg, ibuprofen 600 mg.  Patient has not attended unit group sessions today.  No behavioral issues noted overnight.  Patient assessment note, 04/25/2021: Patient reports today that his mood has improved significantly because he found out that he can be accepted into the teen challenge program.  He states that most of his depressive symptoms come from his homelessness.  He denies SI/HI/AVH, denies paranoia and there is no evidence of delusional thinking.  There is an objectively less depressed mood, attention to personal hygiene and grooming is fair, eye contact is far, speech is clear & coherent. Thought contents are organized.  Patient reports that his sleep quality last night was good, he reports that his appetite is good, labs requested by CSW for patient to get into the teen challenge program ordered.  Patient reports that teen challenge would not let him go there  on less he is taking all of his Seroquel and also taking of gabapentin.  Patient states that he has been taking gabapentin for 10 years total, states that he did not take it for 1 year when he first went to prison, but they resumed the medication, and he took it for 4 years prior to being released from prison last year.  Patient reports that gabapentin is ordered for nerve pain.  Patient educated on the fact that given that he has been taking gabapentin for 10 years, it would not be advisable to come off the medication if it is helping him.  Patient states medication is helping with anxiety and also with his nerve pain, but insists that he has to go to this program.  Patient educated that he can look for alternative programs that will accept gabapentin and Seroquel, but insists that he wants to come off of this medication.  Patient has been educated that weaning gabapentin too rapidly can lead to seizures, but has remained insistent on being weaned off this medication.  Patient also asking for his Wellbutrin to be increased to 300 mg and gabapentin taken off.  Patient has been educated that this will not be done as this puts him at an even greater risk of seizures. We will reduce gabapentin to 400 mg 3 times daily starting today, and further reduce gabapentin to 300 mg 3 times daily starting tomorrow and see how patient tolerates this.  We will decrease Seroquel to 50 mg nightly starting tonight, and further reduce it to 25 mg x 2 doses on 01/9 & 1/10, then stop it.  Daytime  Seroquel will be discontinued.  All other medications will continue as below.  Hydroxyzine will be increased to 50 mg 3 times daily scheduled for management of anxiety while patient comes of gabapentin.  Patient has been educated on benefits, rationales, all possible side effects of all medications, verbalizes understanding, and is agreeable to plan as above.  Total Time spent with patient: 45 minutes  Past Psychiatric History: See H &  P  Past Medical History:  Past Medical History:  Diagnosis Date   Compression fracture of T12 vertebra ([place]) 2008   Depression    Hypertension    History reviewed. No pertinent surgical history. Family History:  Family History  Problem Relation Age of Onset   Drug abuse Cousin    Family Psychiatric  History: See H & P Social History:  Social History   Substance and Sexual Activity  Alcohol Use Yes   Alcohol/week: 2.0 standard drinks of alcohol   Types: 2 Cans of beer per week   Comment: last used 4 days ago in prison     Social History   Substance and Sexual Activity  Drug Use Yes   Frequency: 2.0 times per week   Types: Cocaine, Marijuana   Comment: last used 4 days ago    Social History   Socioeconomic History   Marital status: Single    Spouse name: Not on file   Number of children: Not on file   Years of education: 12   Highest education level: GED or equivalent  Occupational History   Not on file  Tobacco Use   Smoking status: Former    Packs/day: 1.00    Years: 15.00    Total pack years: 15.00    Types: Cigarettes    Quit date: 03/18/2022    Years since quitting: 0.1   Smokeless tobacco: Never  Vaping Use   Vaping Use: Never used  Substance and Sexual Activity   Alcohol use: Yes    Alcohol/week: 2.0 standard drinks of alcohol    Types: 2 Cans of beer per week    Comment: last used 4 days ago in prison   Drug use: Yes    Frequency: 2.0 times per week    Types: Cocaine, Marijuana    Comment: last used 4 days ago   Sexual activity: Yes  Other Topics Concern   Not on file  Social History Narrative   Not on file   Social Determinants of Health   Financial Resource Strain: Not on file  Food Insecurity: No Food Insecurity (04/20/2022)   Hunger Vital Sign    Worried About Running Out of Food in the Last Year: Never true    Ran Out of Food in the Last Year: Never true  Transportation Needs: Unknown (04/20/2022)   PRAPARE - Radiographer, therapeutic (Medical): Patient refused    Lack of Transportation (Non-Medical): Patient refused  Physical Activity: Not on file  Stress: Not on file  Social Connections: Not on file   Sleep: Poor  Appetite:  Fair  Current Medications: Current Facility-Administered Medications  Medication Dose Route Frequency Provider Last Rate Last Admin   acetaminophen (TYLENOL) tablet 650 mg  650 mg Oral Q6H PRN Lucky Rathke, FNP   650 mg at 04/21/22 0820   alum & mag hydroxide-simeth (MAALOX/MYLANTA) 200-200-20 MG/5ML suspension 30 mL  30 mL Oral Q4H PRN Lucky Rathke, FNP       buPROPion (WELLBUTRIN XL) 24 hr tablet 150 mg  150 mg Oral Daily Lenard Lance, FNP   150 mg at 04/25/22 0750   fenofibrate tablet 160 mg  160 mg Oral Daily Starleen Blue, NP   160 mg at 04/25/22 0750   [START ON 04/26/2022] gabapentin (NEURONTIN) capsule 300 mg  300 mg Oral TID Starleen Blue, NP       gabapentin (NEURONTIN) capsule 400 mg  400 mg Oral TID Starleen Blue, NP       hydrOXYzine (ATARAX) tablet 50 mg  50 mg Oral TID Starleen Blue, NP       ibuprofen (ADVIL) tablet 600 mg  600 mg Oral Q6H PRN Starleen Blue, NP   600 mg at 04/25/22 7829   magnesium hydroxide (MILK OF MAGNESIA) suspension 30 mL  30 mL Oral Daily PRN Lenard Lance, FNP       melatonin tablet 5 mg  5 mg Oral QHS Virda Betters, NP       menthol-cetylpyridinium (CEPACOL) lozenge 3 mg  1 lozenge Oral PRN Starleen Blue, NP   3 mg at 04/25/22 1333   OLANZapine zydis (ZYPREXA) disintegrating tablet 5 mg  5 mg Oral BID PRN Starleen Blue, NP       Or   OLANZapine (ZYPREXA) injection 5 mg  5 mg Intramuscular BID PRN Starleen Blue, NP       prazosin (MINIPRESS) capsule 1 mg  1 mg Oral QHS Cassady Turano, NP   1 mg at 04/24/22 2016   [START ON 04/26/2022] QUEtiapine (SEROQUEL) tablet 25 mg  25 mg Oral QHS Andilyn Bettcher, NP       QUEtiapine (SEROQUEL) tablet 50 mg  50 mg Oral QHS Jamarious Febo, NP       Vitamin D (Ergocalciferol) (DRISDOL) 1.25 MG  (50000 UNIT) capsule 50,000 Units  50,000 Units Oral Q7 days Starleen Blue, NP   50,000 Units at 04/22/22 1555    Lab Results:  Results for orders placed or performed during the hospital encounter of 04/20/22 (from the past 48 hour(s))  Resp panel by RT-PCR (RSV, Flu A&B, Covid) Anterior Nasal Swab     Status: None   Collection Time: 04/24/22 10:16 AM   Specimen: Anterior Nasal Swab  Result Value Ref Range   SARS Coronavirus 2 by RT PCR NEGATIVE NEGATIVE    Comment: (NOTE) SARS-CoV-2 target nucleic acids are NOT DETECTED.  The SARS-CoV-2 RNA is generally detectable in upper respiratory specimens during the acute phase of infection. The lowest concentration of SARS-CoV-2 viral copies this assay can detect is 138 copies/mL. A negative result does not preclude SARS-Cov-2 infection and should not be used as the sole basis for treatment or other patient management decisions. A negative result may occur with  improper specimen collection/handling, submission of specimen other than nasopharyngeal swab, presence of viral mutation(s) within the areas targeted by this assay, and inadequate number of viral copies(<138 copies/mL). A negative result must be combined with clinical observations, patient history, and epidemiological information. The expected result is Negative.  Fact Sheet for Patients:  BloggerCourse.com  Fact Sheet for Healthcare Providers:  SeriousBroker.it  This test is no t yet approved or cleared by the [place] FDA and  has been authorized for detection and/or diagnosis of SARS-CoV-2 by FDA under an Emergency Use Authorization (EUA). This EUA will remain  in effect (meaning this test can be used) for the duration of the COVID-19 declaration under Section 564(b)(1) of the Act, 21 U.S.C.section 360bbb-3(b)(1), unless the authorization is terminated  or revoked sooner.  Influenza A by PCR NEGATIVE NEGATIVE    Influenza B by PCR NEGATIVE NEGATIVE    Comment: (NOTE) The Xpert Xpress SARS-CoV-2/FLU/RSV plus assay is intended as an aid in the diagnosis of influenza from Nasopharyngeal swab specimens and should not be used as a sole basis for treatment. Nasal washings and aspirates are unacceptable for Xpert Xpress SARS-CoV-2/FLU/RSV testing.  Fact Sheet for Patients: EntrepreneurPulse.com.au  Fact Sheet for Healthcare Providers: IncredibleEmployment.be  This test is not yet approved or cleared by the [place] FDA and has been authorized for detection and/or diagnosis of SARS-CoV-2 by FDA under an Emergency Use Authorization (EUA). This EUA will remain in effect (meaning this test can be used) for the duration of the COVID-19 declaration under Section 564(b)(1) of the Act, 21 U.S.C. section 360bbb-3(b)(1), unless the authorization is terminated or revoked.     Resp Syncytial Virus by PCR NEGATIVE NEGATIVE    Comment: (NOTE) Fact Sheet for Patients: EntrepreneurPulse.com.au  Fact Sheet for Healthcare Providers: IncredibleEmployment.be  This test is not yet approved or cleared by the [place] FDA and has been authorized for detection and/or diagnosis of SARS-CoV-2 by FDA under an Emergency Use Authorization (EUA). This EUA will remain in effect (meaning this test can be used) for the duration of the COVID-19 declaration under Section 564(b)(1) of the Act, 21 U.S.C. section 360bbb-3(b)(1), unless the authorization is terminated or revoked.  Performed at [facility], [place] [address] [place], [place] [address]   Resp panel by RT-PCR (RSV, Flu A&B, Covid) Anterior Nasal Swab     Status: None   Collection Time: 04/25/22  1:37 PM   Specimen: Anterior Nasal Swab  Result Value Ref Range   SARS Coronavirus 2 by RT PCR NEGATIVE NEGATIVE    Comment: (NOTE) SARS-CoV-2 target nucleic acids are  NOT DETECTED.  The SARS-CoV-2 RNA is generally detectable in upper respiratory specimens during the acute phase of infection. The lowest concentration of SARS-CoV-2 viral copies this assay can detect is 138 copies/mL. A negative result does not preclude SARS-Cov-2 infection and should not be used as the sole basis for treatment or other patient management decisions. A negative result may occur with  improper specimen collection/handling, submission of specimen other than nasopharyngeal swab, presence of viral mutation(s) within the areas targeted by this assay, and inadequate number of viral copies(<138 copies/mL). A negative result must be combined with clinical observations, patient history, and epidemiological information. The expected result is Negative.  Fact Sheet for Patients:  EntrepreneurPulse.com.au  Fact Sheet for Healthcare Providers:  IncredibleEmployment.be  This test is no t yet approved or cleared by the [place] FDA and  has been authorized for detection and/or diagnosis of SARS-CoV-2 by FDA under an Emergency Use Authorization (EUA). This EUA will remain  in effect (meaning this test can be used) for the duration of the COVID-19 declaration under Section 564(b)(1) of the Act, 21 U.S.C.section 360bbb-3(b)(1), unless the authorization is terminated  or revoked sooner.       Influenza A by PCR NEGATIVE NEGATIVE   Influenza B by PCR NEGATIVE NEGATIVE    Comment: (NOTE) The Xpert Xpress SARS-CoV-2/FLU/RSV plus assay is intended as an aid in the diagnosis of influenza from Nasopharyngeal swab specimens and should not be used as a sole basis for treatment. Nasal washings and aspirates are unacceptable for Xpert Xpress SARS-CoV-2/FLU/RSV testing.  Fact Sheet for Patients: EntrepreneurPulse.com.au  Fact Sheet for Healthcare Providers: IncredibleEmployment.be  This test is not yet approved  or cleared  by the [place] and has been authorized for detection and/or diagnosis of SARS-CoV-2 by FDA under an Emergency Use Authorization (EUA). This EUA will remain in effect (meaning this test can be used) for the duration of the COVID-19 declaration under Section 564(b)(1) of the Act, 21 U.S.C. section 360bbb-3(b)(1), unless the authorization is terminated or revoked.     Resp Syncytial Virus by PCR NEGATIVE NEGATIVE    Comment: (NOTE) Fact Sheet for Patients: EntrepreneurPulse.com.au  Fact Sheet for Healthcare Providers: IncredibleEmployment.be  This test is not yet approved or cleared by the [place] FDA and has been authorized for detection and/or diagnosis of SARS-CoV-2 by FDA under an Emergency Use Authorization (EUA). This EUA will remain in effect (meaning this test can be used) for the duration of the COVID-19 declaration under Section 564(b)(1) of the Act, 21 U.S.C. section 360bbb-3(b)(1), unless the authorization is terminated or revoked.  Performed at [facility], [place] [address] [place], [place] [address]     Blood Alcohol level:  Lab Results  Component Value Date   ETH <10 04/02/2022   ETH <10 123XX123    Metabolic Disorder Labs: Lab Results  Component Value Date   HGBA1C 5.3 04/02/2022   MPG 105 04/02/2022   MPG 103 08/31/2016   Lab Results  Component Value Date   PROLACTIN 28.5 (H) 08/31/2016   Lab Results  Component Value Date   CHOL 201 (H) 04/02/2022   TRIG 207 (H) 04/02/2022   HDL 56 04/02/2022   CHOLHDL 3.6 04/02/2022   VLDL 41 (H) 04/02/2022   LDLCALC 104 (H) 04/02/2022   LDLCALC 76 08/31/2016   Physical Findings: AIMS: 0 CIWA: n/a  COWS: n/a    Musculoskeletal: Strength & Muscle Tone: within normal limits Gait & Station: normal Patient leans: N/A  Psychiatric Specialty Exam:  Presentation  General Appearance:  Appropriate for Environment;  Casual  Eye Contact: Fair  Speech: Clear and Coherent; Normal Rate  Speech Volume: Normal  Handedness: Right   Mood and Affect  Mood: Euthymic  Affect: Congruent   Thought Process  Thought Processes: Coherent  Descriptions of Associations:Intact  Orientation:Full (Time, Place and Person)  Thought Content:Logical  History of Schizophrenia/Schizoaffective disorder:No  Duration of Psychotic Symptoms:N/A  Hallucinations:Hallucinations: None  Ideas of Reference:None  Suicidal Thoughts:Suicidal Thoughts: No  Homicidal Thoughts:Homicidal Thoughts: No   Sensorium  Memory: Immediate Good  Judgment: Good  Insight: Good   Executive Functions  Concentration: Good  Attention Span: Good  Recall: Good  Fund of Knowledge: Good  Language: Good   Psychomotor Activity  Psychomotor Activity: Psychomotor Activity: Normal   Assets  Assets: Communication Skills   Sleep  Sleep: Sleep: Fair    Physical Exam: Physical Exam Constitutional:      Appearance: Normal appearance.  HENT:     Head: Normocephalic.     Nose: Nose normal.  Eyes:     Pupils: Pupils are equal, round, and reactive to light.  Pulmonary:     Effort: Pulmonary effort is normal.  Musculoskeletal:        General: Normal range of motion.     Cervical back: Normal range of motion.  Neurological:     Mental Status: He is alert and oriented to person, place, and time.  Psychiatric:        Thought Content: Thought content normal.    Review of Systems  Constitutional: Negative.   HENT: Negative.    Eyes: Negative.   Respiratory: Negative.    Cardiovascular:  Negative.   Gastrointestinal: Negative.   Genitourinary: Negative.   Musculoskeletal: Negative.   Skin: Negative.   Neurological: Negative.   Psychiatric/Behavioral:  Positive for depression and substance abuse. Negative for hallucinations, memory loss and suicidal ideas. The patient is nervous/anxious and has  insomnia.    Blood pressure (!) 149/91, pulse 91, temperature 98.2 F (36.8 C), temperature source Oral, resp. rate 18, height 5\' 7"  (1.702 m), weight 71.2 kg, SpO2 97 %. Body mass index is 24.59 kg/m.  Treatment Plan Summary: Daily contact with patient to assess and evaluate symptoms and progress in treatment and Medication management   Observation Level/Precautions:  15 minute checks  Laboratory:  Labs reviewed   Psychotherapy:  Unit Group sessions  Medications:  See [facility]  Consultations:  To be determined   Discharge Concerns:  Safety, medication compliance, mood stability  Estimated LOS: 5-7 days  Other:  N/A    Labs Reviewed independently on 04/23/2021: Lipid panel WNL, hemoglobin A1c WNL, TSH WNL, CMP WNL.  CBC with elevated WBCs-Repeatd CBC from 1/5 show CBC WNL at 6.1.  EKG from 12/16 with QTc of 440. vitamin D levels low at 16.40, will replenish with vitamin d 50.000 units weekly. vitamin B12 WNL and B1 levels pending. tox screen from the [facility] positive for cocaine, other substances of abuse negative.  Patient however reports that he has not used any recreational substances for 4 months.  UA from [place] negative for UTIs   PLAN Safety and Monitoring: Voluntary admission to inpatient psychiatric unit for safety, stabilization and treatment Daily contact with patient to assess and evaluate symptoms and progress in treatment Patient's case to be discussed in multi-disciplinary team meeting Observation Level : q15 minute checks Vital signs: q12 hours Precautions:Safety   Long Term Goal(s): Improvement in symptoms so as ready for discharge   Short Term Goals: Ability to identify changes in lifestyle to reduce recurrence of condition will improve, Ability to disclose and discuss suicidal ideas, Ability to identify and develop effective coping behaviors will improve, Compliance with prescribed medications will improve, and Ability to identify triggers associated with substance  abuse/mental health issues will improve   Diagnoses  Principal Problem:   MDD (major depressive disorder), recurrent severe, without psychosis ([place]) Active Problems:   Cocaine use disorder, severe, dependence ([place])   Insomnia   Social anxiety disorder   PTSD (post-traumatic stress disorder)   Medications -Start Melatonin 5 mg nightly for sleep -Discontinue Seroquel XL 50 mg daily in the mornings (only had med for 2 days) -Continue Vitamin D 50,000 units for vitamin D deficiency -Continue Prazosin 1 mg nightly for nightmares -Discontinued Abilify on admission-as per pt's preference-states medication is ineffective -Taper off Seroquel IR as per pt request so he can get into the teen challenge program: Decrease dose from 75 mg to 50 mg on 1/8, decrease dose to 25 mg x 2 doses on 1/9 and 1/10, then stop  -Discontinued trazodone on admission-patient states medication is not effective for sleep -Taper off gabapentin 600 mg TID per pt request. Dose reduced to 400 mg on 1/8 x 1 dose, then reduced to 300 mg TID starting 1/9. -Change Hydroxyzine 50 from PRN to scheduled TID as pt is weaned off Gabapentin -Continue Wellbutrin XL 150 mg for depressive symptoms **Patient educated on rationales, benefits, and possible side effects of all medications.  He verbalizes understanding and is agreeable to trials of all medications as listed above.   Other PRNS -Continue agitation protocol: Decrease Zyprexa 10 mg IM  or p.o. to Zyprexa 5 mg PO/IM for agitation twice daily as needed -Continue Tylenol 650 mg every 6 hours PRN for mild pain -Continue Maalox 30 mg every 4 hrs PRN for indigestion -Continue Milk of Magnesia as needed every 6 hrs for constipation   Discharge Planning: Social work and case management to assist with discharge planning and identification of hospital follow-up needs prior to discharge Estimated LOS: 5-7 days Discharge Concerns: Need to establish a safety plan; Medication compliance  and effectiveness Discharge Goals: Return home with outpatient referrals for mental health follow-up including medication management/psychotherapy   I certify that inpatient services furnished can reasonably be expected to improve the patient's condition.    Nicholes Rough, NP 04/25/2022, 5:07 PMPatient ID: Theodore Carr, male   DOB: Aug 29, 1985, 37 y.o.   MRN: [id number] Patient ID: Esley Creger, male   DOB: 03-21-1986, 37 y.o.   MRN: [id number] Patient ID: Ryen Vazguez, male   DOB: [birth year], 37 y.o.   MRN: [id number]

## 2022-04-26 LAB — HEMOGLOBIN A1C
Hgb A1c MFr Bld: 5.2 % (ref 4.8–5.6)
Mean Plasma Glucose: 103 mg/dL

## 2022-04-26 MED ORDER — MELATONIN 5 MG PO TABS
5.0000 mg | ORAL_TABLET | Freq: Every day | ORAL | Status: DC
  Administered 2022-04-26 – 2022-04-28 (×3): 5 mg via ORAL
  Filled 2022-04-26 (×5): qty 1

## 2022-04-26 MED ORDER — GABAPENTIN 100 MG PO CAPS
100.0000 mg | ORAL_CAPSULE | Freq: Three times a day (TID) | ORAL | Status: AC
  Administered 2022-04-28 (×3): 100 mg via ORAL
  Filled 2022-04-26 (×3): qty 1

## 2022-04-26 MED ORDER — PRAZOSIN HCL 1 MG PO CAPS
1.0000 mg | ORAL_CAPSULE | Freq: Every day | ORAL | Status: DC
  Administered 2022-04-26 – 2022-04-28 (×3): 1 mg via ORAL
  Filled 2022-04-26 (×5): qty 1

## 2022-04-26 MED ORDER — GABAPENTIN 300 MG PO CAPS
300.0000 mg | ORAL_CAPSULE | Freq: Once | ORAL | Status: AC
  Administered 2022-04-26: 300 mg via ORAL
  Filled 2022-04-26: qty 1

## 2022-04-26 MED ORDER — QUETIAPINE FUMARATE 25 MG PO TABS
25.0000 mg | ORAL_TABLET | Freq: Every day | ORAL | Status: AC
  Administered 2022-04-26 – 2022-04-27 (×2): 25 mg via ORAL
  Filled 2022-04-26 (×2): qty 1

## 2022-04-26 MED ORDER — HYDROXYZINE HCL 50 MG PO TABS
50.0000 mg | ORAL_TABLET | Freq: Three times a day (TID) | ORAL | Status: DC
  Administered 2022-04-26 – 2022-04-27 (×2): 50 mg via ORAL
  Filled 2022-04-26 (×5): qty 1

## 2022-04-26 MED ORDER — GABAPENTIN 100 MG PO CAPS
200.0000 mg | ORAL_CAPSULE | Freq: Three times a day (TID) | ORAL | Status: AC
  Administered 2022-04-27 (×3): 200 mg via ORAL
  Filled 2022-04-26 (×3): qty 2

## 2022-04-26 MED ORDER — GABAPENTIN 300 MG PO CAPS
300.0000 mg | ORAL_CAPSULE | Freq: Three times a day (TID) | ORAL | Status: DC
  Filled 2022-04-26: qty 1

## 2022-04-26 MED ORDER — GABAPENTIN 300 MG PO CAPS: 300.0000 mg | ORAL_CAPSULE | Freq: Three times a day (TID) | ORAL | Status: DC

## 2022-04-26 MED ORDER — GABAPENTIN 100 MG PO CAPS: 100.0000 mg | ORAL_CAPSULE | Freq: Three times a day (TID) | ORAL | Status: DC

## 2022-04-26 MED ORDER — TUBERCULIN PPD 5 UNIT/0.1ML ID SOLN
5.0000 [IU] | Freq: Once | INTRADERMAL | Status: AC
  Administered 2022-04-26: 5 [IU] via INTRADERMAL
  Filled 2022-04-26: qty 0.1

## 2022-04-26 NOTE — Plan of Care (Signed)
  Problem: Safety: Goal: Ability to remain free from injury will improve Outcome: Progressing   

## 2022-04-26 NOTE — Progress Notes (Signed)
PPD placed on patient's LEFT forearm on 04/26/2022 at 4:30 pm per MD order.

## 2022-04-26 NOTE — Group Note (Signed)
LCSW Group Therapy Note  Group Date: 04/26/2022 Start Time: 1100 End Time: 1200   Type of Therapy and Topic:  Group Therapy: Positive Affirmations  Participation Level:  Active   Description of Group:   This group addressed positive affirmation towards self and others.  Patients went around the room and identified two positive things about themselves and two positive things about a peer in the room.  Patients reflected on how it felt to share something positive with others, to identify positive things about themselves, and to hear positive things from others/ Patients were encouraged to have a daily reflection of positive characteristics or circumstances.   Therapeutic Goals: Patients will verbalize two of their positive qualities Patients will demonstrate empathy for others by stating two positive qualities about a peer in the group Patients will verbalize their feelings when voicing positive self affirmations and when voicing positive affirmations of others Patients will discuss the potential positive impact on their wellness/recovery of focusing on positive traits of self and others.  Summary of Patient Progress:  Theodore Carr actively engaged in the discussion and . [name] was able to identify positive affirmations about himself as well as other group members. Patient demonstrated good insight into the subject matter, was respectful of peers, participated throughout the entire session.  Therapeutic Modalities:   Cognitive Behavioral Therapy Motivational Interviewing    Windle Guard, LCSW 04/26/2022  1:49 PM

## 2022-04-26 NOTE — Group Note (Signed)
Date:  04/26/2022 Time:  10:24 AM  Group Topic/Focus:  Goals Group:   The focus of this group is to help patients establish daily goals to achieve during treatment and discuss how the patient can incorporate goal setting into their daily lives to aide in recovery.    Participation Level:  Did Not Attend  Participation Quality:      Affect:      Cognitive:      Insight: None  Engagement in Group:  Defensive and    Modes of Intervention:      Additional Comments:     Jerrye Beavers 04/26/2022, 10:24 AM

## 2022-04-26 NOTE — [facility] Group Notes (Signed)
Spiritual care group on grief and loss facilitated by Chaplain Janne Napoleon, [name], Chaplain Genesis Adams and Lysle Morales, [place] intern.    Group Goal:  Support / Education around grief and loss  Members engage in facilitated group support and psycho-social education.  Group Description:  Following introductions and group rules, group members engaged in facilitated group dialog and support around topic of loss, with particular support around experiences of loss in their lives. Group Identified types of loss (relationships / self / things) and identified patterns, circumstances, and changes that precipitate losses. Reflected on thoughts / feelings around loss, normalized grief responses, and recognized variety in grief experience.  Group drew on Adlerian / Rogerian, narrative, MI,  Patient Progress: Syair attended group and actively engaged in group conversation.  He shared very expressively and received positive feedback from the group.  His comments demonstrated good insight into the topic.  He shared about the loss of his mother when he was young and how he connects with other family members to feel connected to her.  [address], [place] Pager, [phone]

## 2022-04-26 NOTE — Progress Notes (Signed)
Pt is calm and pleasant upon approach.  Pt is assertive and makes his needs known.  Pt presents with logical thought process and content.  Pt tolerating medications without incident and no adverse reactions are noted.  Pt denies SI/HI/AVH.

## 2022-04-26 NOTE — Progress Notes (Signed)
   04/26/22 0000  Psych Admission Type (Psych Patients Only)  Admission Status Voluntary  Psychosocial Assessment  Patient Complaints Anxiety  Eye Contact Fair  Facial Expression Anxious  Affect Depressed  Speech Logical/coherent  Interaction Assertive  Motor Activity Pacing  Appearance/Hygiene Unremarkable  Behavior Characteristics Cooperative  Mood Anxious  Thought Process  Coherency WDL  Content WDL  Delusions None reported or observed  Perception WDL  Hallucination None reported or observed  Judgment Poor  Confusion None  Danger to Self  Current suicidal ideation? Denies  Self-Injurious Behavior No self-injurious ideation or behavior indicators observed or expressed   Agreement Not to Harm Self Yes  Description of Agreement verbal  Danger to Others  Danger to Others None reported or observed   Alert/oriented. Makes needs/concerns known to staff. Pleasant cooperative with staff. Denies SI/HI/A/V hallucinations. Med compliant. Patient states went to group earlier in shift. Will encourage continue compliance and progression towards goals. Verbally contracted for safety. Will continue to monitor.

## 2022-04-26 NOTE — Progress Notes (Signed)
[facility] MD Progress Note  04/26/2022 3:39 PM Theodore Carr  MRN:  [id number] Principal Problem: MDD (major depressive disorder), recurrent severe, without psychosis (HCC) Diagnosis: Principal Problem:   MDD (major depressive disorder), recurrent severe, without psychosis (HCC) Active Problems:   Cocaine use disorder, severe, dependence (HCC)   Insomnia   Social anxiety disorder   PTSD (post-traumatic stress disorder)  Reason For Admission: Theodore Carr is a 37 yo Caucasian male with a self reported past mental health history of MDD, bipolar 2 d/o, social anxiety, PTSD, and paranoia, who presented to the [facility] health,  with complaints of worsening suicide thoughts with plan to hang himself and homicidal ideations towards his room mate. Pt was transferred voluntarily to this [facility] for treatment and stabilization of his mental status.   24 hr chart review: Vital signs within normal limits.  As needed medications given in the past 24 hours of ibuprofen, Zyprexa 5 mg, and Tylenol.  Patient has been compliant with all of his medications in the past 24 hours.  He has attended some unit group activities.  No behavioral issues noted in the past 24 hours.  Patient assessment note, 04/26/2021: Mood is less depressed, affect is congruent. His attention to personal hygiene and grooming is fair, eye contact is good, speech is clear & coherent. Thought contents are organized and logical, and pt currently denies SI/HI/AVH or paranoia. There is no evidence of delusional thoughts.  Pt reported a headache earlier today morning, and stated that Ibuprofen relieved it. He stated that his headache was not related to him being tapered off the Gabapentin or the Seroquel, and that he typically always has headaches. He reports that his sleep; quality last night was good, and reports a good appetite.   We will continue to taper Seroquel and gabapentin as requested by pt so that he can go to the teens challenge program. We  are decreasing Gabapentin to 200 mg TID on 1/10, to 100 mg TID on 1/11, then stopping it.  Seroquel is being reduced to 25 mg x 2 doses on 01/9 & 1/10, then stopping it.  Daytime Seroquel discontinued on 1/8.   Hydroxyzine will being kept at 50 mg 3 times daily scheduled for management of anxiety while patient comes off gabapentin. Patient has been educated on benefits, rationales, all possible side effects of all medications, verbalizes understanding, and is agreeable to plan as above.  Total Time spent with patient: 45 minutes  Past Psychiatric History: See H & P  Past Medical History:  Past Medical History:  Diagnosis Date   Compression fracture of T12 vertebra (HCC) 2008   Depression    Hypertension    History reviewed. No pertinent surgical history. Family History:  Family History  Problem Relation Age of Onset   Drug abuse Cousin    Family Psychiatric  History: See H & P Social History:  Social History   Substance and Sexual Activity  Alcohol Use Yes   Alcohol/week: 2.0 standard drinks of alcohol   Types: 2 Cans of beer per week   Comment: last used 4 days ago in prison     Social History   Substance and Sexual Activity  Drug Use Yes   Frequency: 2.0 times per week   Types: Cocaine, Marijuana   Comment: last used 4 days ago    Social History   Socioeconomic History   Marital status: Single    Spouse name: Not on file   Number of children: Not on file  Years of education: 37   Highest education level: GED or equivalent  Occupational History   Not on file  Tobacco Use   Smoking status: Former    Packs/day: 1.00    Years: 15.00    Total pack years: 15.00    Types: Cigarettes    Quit date: 03/18/2022    Years since quitting: 0.1   Smokeless tobacco: Never  Vaping Use   Vaping Use: Never used  Substance and Sexual Activity   Alcohol use: Yes    Alcohol/week: 2.0 standard drinks of alcohol    Types: 2 Cans of beer per week    Comment: last used 4 days ago  in prison   Drug use: Yes    Frequency: 2.0 times per week    Types: Cocaine, Marijuana    Comment: last used 4 days ago   Sexual activity: Yes  Other Topics Concern   Not on file  Social History Narrative   Not on file   Social Determinants of Health   Financial Resource Strain: Not on file  Food Insecurity: No Food Insecurity (04/20/2022)   Hunger Vital Sign    Worried About Running Out of Food in the Last Year: Never true    Ran Out of Food in the Last Year: Never true  Transportation Needs: Unknown (04/20/2022)   [place] - Hydrologist (Medical): Patient refused    Lack of Transportation (Non-Medical): Patient refused  Physical Activity: Not on file  Stress: Not on file  Social Connections: Not on file   Sleep: Poor  Appetite:  Fair  Current Medications: Current Facility-Administered Medications  Medication Dose Route Frequency Provider Last Rate Last Admin   acetaminophen (TYLENOL) tablet 650 mg  650 mg Oral Q6H PRN Lucky Rathke, FNP   650 mg at 04/26/22 1039   alum & mag hydroxide-simeth (MAALOX/MYLANTA) 200-200-20 MG/5ML suspension 30 mL  30 mL Oral Q4H PRN Lucky Rathke, FNP       buPROPion (WELLBUTRIN XL) 24 hr tablet 150 mg  150 mg Oral Daily Lucky Rathke, FNP   150 mg at 04/26/22 1962   fenofibrate tablet 160 mg  160 mg Oral Daily Nicholes Rough, NP   160 mg at 04/26/22 0724   [START ON 04/28/2022] gabapentin (NEURONTIN) capsule 100 mg  100 mg Oral TID Nicholes Rough, NP       Derrill Memo ON 04/27/2022] gabapentin (NEURONTIN) capsule 200 mg  200 mg Oral TID Nicholes Rough, NP       gabapentin (NEURONTIN) capsule 300 mg  300 mg Oral Once Nicholes Rough, NP       hydrOXYzine (ATARAX) tablet 50 mg  50 mg Oral TID Nicholes Rough, NP       ibuprofen (ADVIL) tablet 600 mg  600 mg Oral Q6H PRN Nicholes Rough, NP   600 mg at 04/26/22 1039   magnesium hydroxide (MILK OF MAGNESIA) suspension 30 mL  30 mL Oral Daily PRN Lucky Rathke, FNP       melatonin  tablet 5 mg  5 mg Oral QHS Alie Hardgrove, NP       menthol-cetylpyridinium (CEPACOL) lozenge 3 mg  1 lozenge Oral PRN Nicholes Rough, NP   3 mg at 04/25/22 1333   OLANZapine zydis (ZYPREXA) disintegrating tablet 5 mg  5 mg Oral BID PRN Nicholes Rough, NP   5 mg at 04/25/22 1747   Or   OLANZapine (ZYPREXA) injection 5 mg  5 mg Intramuscular BID PRN  Starleen BlueNkwenti, Benjaman Artman, NP       prazosin (MINIPRESS) capsule 1 mg  1 mg Oral QHS Aurore Redinger, NP       QUEtiapine (SEROQUEL) tablet 25 mg  25 mg Oral QHS Anastasia Tompson, NP       Vitamin D (Ergocalciferol) (DRISDOL) 1.25 MG (50000 UNIT) capsule 50,000 Units  50,000 Units Oral Q7 days Starleen BlueNkwenti, Kennadie Brenner, NP   50,000 Units at 04/22/22 1555    Lab Results:  Results for orders placed or performed during the hospital encounter of 04/20/22 (from the past 48 hour(s))  Resp panel by RT-PCR (RSV, Flu A&B, Covid) Anterior Nasal Swab     Status: None   Collection Time: 04/25/22  1:37 PM   Specimen: Anterior Nasal Swab  Result Value Ref Range   SARS Coronavirus 2 by RT PCR NEGATIVE NEGATIVE    Comment: (NOTE) SARS-CoV-2 target nucleic acids are NOT DETECTED.  The SARS-CoV-2 RNA is generally detectable in upper respiratory specimens during the acute phase of infection. The lowest concentration of SARS-CoV-2 viral copies this assay can detect is 138 copies/mL. A negative result does not preclude SARS-Cov-2 infection and should not be used as the sole basis for treatment or other patient management decisions. A negative result may occur with  improper specimen collection/handling, submission of specimen other than nasopharyngeal swab, presence of viral mutation(s) within the areas targeted by this assay, and inadequate number of viral copies(<138 copies/mL). A negative result must be combined with clinical observations, patient history, and epidemiological information. The expected result is Negative.  Fact Sheet for Patients:   BloggerCourse.comhttps://www.fda.gov/media/152166/download  Fact Sheet for Healthcare Providers:  SeriousBroker.ithttps://www.fda.gov/media/152162/download  This test is no t yet approved or cleared by the [place] FDA and  has been authorized for detection and/or diagnosis of SARS-CoV-2 by FDA under an Emergency Use Authorization (EUA). This EUA will remain  in effect (meaning this test can be used) for the duration of the COVID-19 declaration under Section 564(b)(1) of the Act, 21 U.S.C.section 360bbb-3(b)(1), unless the authorization is terminated  or revoked sooner.       Influenza A by PCR NEGATIVE NEGATIVE   Influenza B by PCR NEGATIVE NEGATIVE    Comment: (NOTE) The Xpert Xpress SARS-CoV-2/FLU/RSV plus assay is intended as an aid in the diagnosis of influenza from Nasopharyngeal swab specimens and should not be used as a sole basis for treatment. Nasal washings and aspirates are unacceptable for Xpert Xpress SARS-CoV-2/FLU/RSV testing.  Fact Sheet for Patients: BloggerCourse.comhttps://www.fda.gov/media/152166/download  Fact Sheet for Healthcare Providers: SeriousBroker.ithttps://www.fda.gov/media/152162/download  This test is not yet approved or cleared by the [place] FDA and has been authorized for detection and/or diagnosis of SARS-CoV-2 by FDA under an Emergency Use Authorization (EUA). This EUA will remain in effect (meaning this test can be used) for the duration of the COVID-19 declaration under Section 564(b)(1) of the Act, 21 U.S.C. section 360bbb-3(b)(1), unless the authorization is terminated or revoked.     Resp Syncytial Virus by PCR NEGATIVE NEGATIVE    Comment: (NOTE) Fact Sheet for Patients: BloggerCourse.comhttps://www.fda.gov/media/152166/download  Fact Sheet for Healthcare Providers: SeriousBroker.ithttps://www.fda.gov/media/152162/download  This test is not yet approved or cleared by the [place] FDA and has been authorized for detection and/or diagnosis of SARS-CoV-2 by FDA under an Emergency Use Authorization (EUA).  This EUA will remain in effect (meaning this test can be used) for the duration of the COVID-19 declaration under Section 564(b)(1) of the Act, 21 U.S.C. section 360bbb-3(b)(1), unless the authorization is terminated or revoked.  Performed  at [facility], [address], [place], [place] [address]   Hemoglobin A1c     Status: None   Collection Time: 04/25/22  6:27 PM  Result Value Ref Range   Hgb A1c MFr Bld 5.2 4.8 - 5.6 %    Comment: (NOTE)         Prediabetes: 5.7 - 6.4         Diabetes: >6.4         Glycemic control for adults with diabetes: <7.0    Mean Plasma Glucose 103 mg/dL    Comment: (NOTE) Performed At: [facility] [address] [place], [place] [id number] Jolene Schimke MD UU:[id number]   HIV Antibody (routine testing w rflx)     Status: None   Collection Time: 04/25/22  6:27 PM  Result Value Ref Range   HIV Screen 4th Generation wRfx Non Reactive Non Reactive    Comment: Performed at [facility], [address], [place], [place] [address]  Hepatitis panel, acute     Status: None   Collection Time: 04/25/22  6:27 PM  Result Value Ref Range   Hepatitis B Surface Ag NON REACTIVE NON REACTIVE   HCV Ab NON REACTIVE NON REACTIVE    Comment: (NOTE) Nonreactive HCV antibody screen is consistent with no HCV infections,  unless recent infection is suspected or other evidence exists to indicate HCV infection.     Hep A IgM NON REACTIVE NON REACTIVE   Hep B C IgM NON REACTIVE NON REACTIVE    Comment: Performed at [facility], [address], [place], [place] [address]    Blood Alcohol level:  Lab Results  Component Value Date   [facility] <10 04/02/2022   ETH <10 06/05/2017    Metabolic Disorder Labs: Lab Results  Component Value Date   HGBA1C 5.2 04/25/2022   MPG 103 04/25/2022   MPG 105 04/02/2022   Lab Results  Component Value Date   PROLACTIN 28.5 (H) 08/31/2016   Lab Results  Component Value Date   CHOL 201  (H) 04/02/2022   TRIG 207 (H) 04/02/2022   HDL 56 04/02/2022   CHOLHDL 3.6 04/02/2022   VLDL 41 (H) 04/02/2022   LDLCALC 104 (H) 04/02/2022   LDLCALC 76 08/31/2016   Physical Findings: AIMS: 0 CIWA: n/a  COWS: n/a    Musculoskeletal: Strength & Muscle Tone: within normal limits Gait & Station: normal Patient leans: N/A  Psychiatric Specialty Exam:  Presentation  General Appearance:  Appropriate for Environment; Fairly Groomed  Eye Contact: Fair  Speech: Clear and Coherent  Speech Volume: Normal  Handedness: Right   Mood and Affect  Mood: Depressed  Affect: Congruent   Thought Process  Thought Processes: Coherent  Descriptions of Associations:Intact  Orientation:None  Thought Content:Logical  History of Schizophrenia/Schizoaffective disorder:No  Duration of Psychotic Symptoms:N/A  Hallucinations:Hallucinations: None  Ideas of Reference:None  Suicidal Thoughts:Suicidal Thoughts: No  Homicidal Thoughts:Homicidal Thoughts: No   Sensorium  Memory: Immediate Good  Judgment: Fair  Insight: Fair   Chartered certified accountant: Fair  Attention Span: Fair  Recall: Fair  Fund of Knowledge: Fair  Language: Fair   Psychomotor Activity  Psychomotor Activity: Psychomotor Activity: Normal   Assets  Assets: Communication Skills   Sleep  Sleep: Sleep: Good    Physical Exam: Physical Exam Constitutional:      Appearance: Normal appearance.  HENT:     Head: Normocephalic.     Nose: Nose normal.  Eyes:  Pupils: Pupils are equal, round, and reactive to light.  Pulmonary:     Effort: Pulmonary effort is normal.  Musculoskeletal:        General: Normal range of motion.     Cervical back: Normal range of motion.  Neurological:     Mental Status: He is alert and oriented to person, place, and time.  Psychiatric:        Thought Content: Thought content normal.    Review of Systems  Constitutional:  Negative.   HENT: Negative.    Eyes: Negative.   Respiratory: Negative.    Cardiovascular: Negative.   Gastrointestinal: Negative.   Genitourinary: Negative.   Musculoskeletal: Negative.   Skin: Negative.   Neurological: Negative.   Psychiatric/Behavioral:  Positive for depression and substance abuse. Negative for hallucinations, memory loss and suicidal ideas. The patient is nervous/anxious and has insomnia.    Blood pressure 135/89, pulse 96, temperature 98.2 F (36.8 C), temperature source Oral, resp. rate 14, height 5\' 7"  (1.702 m), weight 71.2 kg, SpO2 97 %. Body mass index is 24.59 kg/m.  Treatment Plan Summary: Daily contact with patient to assess and evaluate symptoms and progress in treatment and Medication management   Observation Level/Precautions:  15 minute checks  Laboratory:  Labs reviewed   Psychotherapy:  Unit Group sessions  Medications:  See [facility]  Consultations:  To be determined   Discharge Concerns:  Safety, medication compliance, mood stability  Estimated LOS: 5-7 days  Other:  N/A    Labs Reviewed independently on 04/23/2021: Lipid panel WNL, hemoglobin A1c WNL, TSH WNL, CMP WNL.  CBC with elevated WBCs-Repeatd CBC from 1/5 show CBC WNL at 6.1.  EKG from 12/16 with QTc of 440. vitamin D levels low at 16.40, will replenish with vitamin d 50.000 units weekly. vitamin B12 WNL and B1 levels pending. tox screen from the [facility] positive for cocaine, other substances of abuse negative.  Patient however reports that he has not used any recreational substances for 4 months.  UA from [place] negative for UTIs   PLAN Safety and Monitoring: Voluntary admission to inpatient psychiatric unit for safety, stabilization and treatment Daily contact with patient to assess and evaluate symptoms and progress in treatment Patient's case to be discussed in multi-disciplinary team meeting Observation Level : q15 minute checks Vital signs: q12 hours Precautions:Safety   Long Term  Goal(s): Improvement in symptoms so as ready for discharge   Short Term Goals: Ability to identify changes in lifestyle to reduce recurrence of condition will improve, Ability to disclose and discuss suicidal ideas, Ability to identify and develop effective coping behaviors will improve, Compliance with prescribed medications will improve, and Ability to identify triggers associated with substance abuse/mental health issues will improve   Diagnoses  Principal Problem:   MDD (major depressive disorder), recurrent severe, without psychosis (HCC) Active Problems:   Cocaine use disorder, severe, dependence (HCC)   Insomnia   Social anxiety disorder   PTSD (post-traumatic stress disorder)   Medications -Continue Melatonin 5 mg nightly for sleep -Discontinued Seroquel XL 50 mg daily in the mornings on 1/8 (only had med for 2 days) -Continue Vitamin D 50,000 units for vitamin D deficiency -Continue Prazosin 1 mg nightly for nightmares -Discontinued Abilify on admission-as per pt's preference-states medication is ineffective -Taper off Seroquel IR as per pt request so he can get into the teen challenge program: Decrease dose from 75 mg to 50 mg on 1/8, decrease dose to 25 mg x 2 doses on 1/9 and  1/10, then stop  -Discontinued trazodone on admission-patient states medication is not effective for sleep -Taper off gabapentin 600 mg TID per pt request. Dose reduced to 400 mg on 1/8 x 1 dose, then reduced to 300 mg TID starting 1/9. Further reduce Gabapentin to 200 mg TID on 1/10, and 100 mg TID on 1/11, then stop it. -Continue Hydroxyzine 50  TID as pt is weaned off Gabapentin -Continue Wellbutrin XL 150 mg for depressive symptoms **Patient educated on rationales, benefits, and possible side effects of all medications.  He verbalizes understanding and is agreeable to trials of all medications as listed above.   Other PRNS -Continue agitation protocol: Decrease Zyprexa 10 mg IM or p.o. to Zyprexa 5 mg  PO/IM for agitation twice daily as needed -Continue Tylenol 650 mg every 6 hours PRN for mild pain -Continue Maalox 30 mg every 4 hrs PRN for indigestion -Continue Milk of Magnesia as needed every 6 hrs for constipation   Discharge Planning: Social work and case management to assist with discharge planning and identification of hospital follow-up needs prior to discharge Estimated LOS: 5-7 days Discharge Concerns: Need to establish a safety plan; Medication compliance and effectiveness Discharge Goals: Return home with outpatient referrals for mental health follow-up including medication management/psychotherapy   I certify that inpatient services furnished can reasonably be expected to improve the patient's condition.    Starleen Blue, NP 04/26/2022, 3:39 PMPatient ID: Jones Bales, male   DOB: [birth year], 37 y.o.   MRN: [id number] Patient ID: Marquan Vokes, male   DOB: 09-18-1985, 37 y.o.   MRN: [id number] Patient ID: Ronal Maybury, male   DOB: 02-Jul-1985, 37 y.o.   MRN: [id number] Patient ID: Wood Novacek, male   DOB: 01-01-1986, 37 y.o.   MRN: [id number]

## 2022-04-26 NOTE — Group Note (Signed)
Recreation Therapy Group Note   Group Topic:Animal Assisted Therapy   Group Date: 04/26/2022 Start Time: 1430 End Time: 1515 Facilitators: Elwanda Moger-McCall, LRT,CTRS Location: [address] Dayroom   Animal-Assisted Activity (AAA) Program Checklist/Progress Notes Patient Eligibility Criteria Checklist & Daily Group note for Rec Tx Intervention  AAA/T Program Assumption of Risk Form signed by Patient/ or Parent Legal Guardian Yes  Patient is free of allergies or severe asthma Yes  Patient reports no fear of animals Yes  Patient reports no history of cruelty to animals Yes  Patient understands his/her participation is voluntary Yes  Patient washes hands before animal contact Yes  Patient washes hands after animal contact Yes   Affect/Mood: Appropriate   Participation Level: Engaged   Participation Quality: Independent   Behavior: Appropriate   Speech/Thought Process: Focused    Clinical Observations/Individualized Feedback:  Patient attended session and interacted appropriately with therapy dog and peers. Patient asked appropriate questions about therapy dog and his training. Patient shared stories about their pets at home with group.     Plan: Continue to engage patient in RT group sessions 2-3x/week.   Theodore Carr, LRT,CTRS 04/26/2022 3:47 PM

## 2022-04-26 NOTE — Progress Notes (Signed)
The patient attended group but refused to share.

## 2022-04-27 MED ORDER — HYDROXYZINE HCL 50 MG PO TABS
50.0000 mg | ORAL_TABLET | Freq: Four times a day (QID) | ORAL | Status: DC
  Administered 2022-04-27 – 2022-04-29 (×9): 50 mg via ORAL
  Filled 2022-04-27 (×16): qty 1

## 2022-04-27 NOTE — Plan of Care (Signed)
  Problem: Education: Goal: Knowledge of disease or condition will improve Outcome: Progressing   Problem: Health Behavior/Discharge Planning: Goal: Ability to identify changes in lifestyle to reduce recurrence of condition will improve Outcome: Progressing   Problem: Safety: Goal: Ability to remain free from injury will improve Outcome: Progressing   Problem: Coping: Goal: Ability to demonstrate self-control will improve Outcome: Progressing

## 2022-04-27 NOTE — Progress Notes (Signed)
   04/27/22 0000  Psych Admission Type (Psych Patients Only)  Admission Status Voluntary  Psychosocial Assessment  Patient Complaints Depression  Eye Contact Fair  Facial Expression Anxious  Affect Depressed  Speech Logical/coherent  Interaction Assertive  Motor Activity Other (Comment)  Appearance/Hygiene Unremarkable  Behavior Characteristics Cooperative  Mood Anxious  Thought Process  Coherency WDL  Content WDL  Delusions None reported or observed  Perception WDL  Hallucination None reported or observed  Judgment Poor  Confusion None  Danger to Self  Current suicidal ideation? Denies  Self-Injurious Behavior No self-injurious ideation or behavior indicators observed or expressed   Agreement Not to Harm Self Yes  Description of Agreement verbal  Danger to Others  Danger to Others None reported or observed   Alert/oriented. Makes needs/concerns known to staff. Pleasant cooperative with staff. Denies SI/HI/A/V hallucinations. Med compliant. Will encourage continue compliance and progression towards goals. Verbally contracted for safety. Will continue to monitor.

## 2022-04-27 NOTE — Group Note (Signed)
Recreation Therapy Group Note   Group Topic:Problem Solving  Group Date: 04/27/2022 Start Time: 0930 End Time: 1002 Facilitators: Kyl Givler-McCall, LRT,CTRS Location: [address] Dayroom   Goal Area(s) Addresses:  Patient will effectively work with peer towards shared goal.  Patient will identify skills used to make activity successful.  Patient will identify how skills used during activity can be used to reach post d/c goals.   Group Description: Landing Pad. In teams of 3-5, patients were given 12 plastic drinking straws and an equal length of masking tape. Using the materials provided, patients were asked to build a landing pad to catch a golf ball dropped from approximately 5 feet in the air. All materials were required to be used by the team in their design. LRT facilitated post-activity discussion.   Affect/Mood: Appropriate   Participation Level: Active   Participation Quality: Independent   Behavior: Appropriate   Speech/Thought Process: Focused   Insight: Good   Judgement: Good   Modes of Intervention: STEM Activity   Patient Response to Interventions:  Attentive   Education Outcome:  Acknowledges education and In group clarification offered    Clinical Observations/Individualized Feedback: Pt was quiet but helped with putting the landing pad together for the group.      Plan: Continue to engage patient in RT group sessions 2-3x/week.   Kyrell Ruacho-McCall, LRT,CTRS 04/27/2022 11:23 AM

## 2022-04-27 NOTE — Plan of Care (Signed)
  Problem: Safety: Goal: Ability to remain free from injury will improve Outcome: Progressing   Problem: Activity: Goal: Sleeping patterns will improve Outcome: Progressing   

## 2022-04-27 NOTE — Progress Notes (Signed)
[facility] MD Progress Note  04/27/2022 3:22 PM Theodore Carr  MRN:  [id number] Principal Problem: MDD (major depressive disorder), recurrent severe, without psychosis (HCC) Diagnosis: Principal Problem:   MDD (major depressive disorder), recurrent severe, without psychosis (HCC) Active Problems:   Cocaine use disorder, severe, dependence (HCC)   Insomnia   Social anxiety disorder   PTSD (post-traumatic stress disorder)  Reason For Admission: Theodore Carr is a 37 yo Caucasian male with a self reported past mental health history of MDD, bipolar 2 d/o, social anxiety, PTSD, and paranoia, who presented to the [facility] health, [place] with complaints of worsening suicide thoughts with plan to hang himself and homicidal ideations towards his room mate. Pt was transferred voluntarily to this [facility] for treatment and stabilization of his mental status.   24 hr chart review: Vital signs within normal limits.  As needed medications given in the past 24 hours of ibuprofen, Zyprexa 5 mg, and Tylenol.  Patient has been compliant with all of his medications in the past 24 hours.  He has attended some unit group activities.  No behavioral issues noted in the past 24 hours. However, pt was given agitation protocol medication (Zyprexa 5 mg) twice in the last 24hrs-there is no documentation as to why this medication was required, but pt states that he was "upset because it is hard coming off the Gabapentin".  Patient assessment note, 04/27/2021: Mood is continuing to be less depressed & affect remains congruent. Pt's attention to personal hygiene and grooming is fair, eye contact is good, speech is clear & coherent. Thoughts remain organized and logical, and pt continues to deny SI/HI/AVH or paranoia. There is no evidence of delusional thoughts.  Pt reports back pain, but states that alternating between taking Ibuprofen and Tylenol has been helpful for him. He was informed to ask the Teen challenge program that he will be going  into to ascertain if another medication such as Trileptal is allowed there, but states that he has asked and was told that the less meds he has, the better. He states that he is determined to not take the Gabapentin and the Seroquel any more. He is tolerating the tapers well, and the goal is for him to be completely weaned off the Gabapentin by the time that he goes to the program on Friday morning. Seroquel taper ends tonight.    Hydroxyzine will be increased to 4 times daily scheduled for management of anxiety while patient comes off gabapentin. He is complaining of worsening anxiety today. We are continuing all other medications as listed below. Pt reports that his appetite continues to be good, and sleep quality is good. He denies medication related side effects, denies all other concerns.  Total Time spent with patient: 45 minutes  Past Psychiatric History: See H & P  Past Medical History:  Past Medical History:  Diagnosis Date   Compression fracture of T12 vertebra (HCC) 2008   Depression    Hypertension    History reviewed. No pertinent surgical history. Family History:  Family History  Problem Relation Age of Onset   Drug abuse Cousin    Family Psychiatric  History: See H & P Social History:  Social History   Substance and Sexual Activity  Alcohol Use Yes   Alcohol/week: 2.0 standard drinks of alcohol   Types: 2 Cans of beer per week   Comment: last [facility] 4 days ago in prison     Social History   Substance and Sexual Activity  Drug Use Yes  Frequency: 2.0 times per week   Types: Cocaine, Marijuana   Comment: last used 4 days ago    Social History   Socioeconomic History   Marital status: Single    Spouse name: Not on file   Number of children: Not on file   Years of education: 12   Highest education level: GED or equivalent  Occupational History   Not on file  Tobacco Use   Smoking status: Former    Packs/day: 1.00    Years: 15.00    Total pack years: 15.00     Types: Cigarettes    Quit date: 03/18/2022    Years since quitting: 0.1   Smokeless tobacco: Never  Vaping Use   Vaping Use: Never used  Substance and Sexual Activity   Alcohol use: Yes    Alcohol/week: 2.0 standard drinks of alcohol    Types: 2 Cans of beer per week    Comment: last used 4 days ago in prison   Drug use: Yes    Frequency: 2.0 times per week    Types: Cocaine, Marijuana    Comment: last used 4 days ago   Sexual activity: Yes  Other Topics Concern   Not on file  Social History Narrative   Not on file   Social Determinants of Health   Financial Resource Strain: Not on file  Food Insecurity: No Food Insecurity (04/20/2022)   Hunger Vital Sign    Worried About Running Out of Food in the Last Year: Never true    Ran Out of Food in the Last Year: Never true  Transportation Needs: Unknown (04/20/2022)   [place] - Hydrologist (Medical): Patient refused    Lack of Transportation (Non-Medical): Patient refused  Physical Activity: Not on file  Stress: Not on file  Social Connections: Not on file   Sleep: Poor  Appetite:  Fair  Current Medications: Current Facility-Administered Medications  Medication Dose Route Frequency Provider Last Rate Last Admin   acetaminophen (TYLENOL) tablet 650 mg  650 mg Oral Q6H PRN Lucky Rathke, FNP   650 mg at 04/26/22 1712   alum & mag hydroxide-simeth (MAALOX/MYLANTA) 200-200-20 MG/5ML suspension 30 mL  30 mL Oral Q4H PRN Lucky Rathke, FNP       buPROPion (WELLBUTRIN XL) 24 hr tablet 150 mg  150 mg Oral Daily Lucky Rathke, FNP   150 mg at 04/27/22 0753   fenofibrate tablet 160 mg  160 mg Oral Daily Nicholes Rough, NP   160 mg at 04/27/22 0753   [START ON 04/28/2022] gabapentin (NEURONTIN) capsule 100 mg  100 mg Oral TID Nicholes Rough, NP       gabapentin (NEURONTIN) capsule 200 mg  200 mg Oral TID Nicholes Rough, NP   200 mg at 04/27/22 1357   hydrOXYzine (ATARAX) tablet 50 mg  50 mg Oral QID Nicholes Rough, NP   50 mg at 04/27/22 1141   ibuprofen (ADVIL) tablet 600 mg  600 mg Oral Q6H PRN Nicholes Rough, NP   600 mg at 04/27/22 0811   magnesium hydroxide (MILK OF MAGNESIA) suspension 30 mL  30 mL Oral Daily PRN Lucky Rathke, FNP       melatonin tablet 5 mg  5 mg Oral QHS Contrina Orona, NP   5 mg at 04/26/22 1858   menthol-cetylpyridinium (CEPACOL) lozenge 3 mg  1 lozenge Oral PRN Nicholes Rough, NP   3 mg at 04/25/22 1333   OLANZapine zydis (  ZYPREXA) disintegrating tablet 5 mg  5 mg Oral BID PRN Nicholes Rough, NP   5 mg at 04/27/22 0813   Or   OLANZapine (ZYPREXA) injection 5 mg  5 mg Intramuscular BID PRN Nicholes Rough, NP       prazosin (MINIPRESS) capsule 1 mg  1 mg Oral QHS Makinsley Schiavi, NP   1 mg at 04/26/22 1858   QUEtiapine (SEROQUEL) tablet 25 mg  25 mg Oral QHS Nicholes Rough, NP   25 mg at 04/26/22 1858   tuberculin injection 5 Units  5 Units Intradermal Once Nicholes Rough, NP   5 Units at 04/26/22 1620   Vitamin D (Ergocalciferol) (DRISDOL) 1.25 MG (50000 UNIT) capsule 50,000 Units  50,000 Units Oral Q7 days Nicholes Rough, NP   50,000 Units at 04/22/22 1555    Lab Results:  Results for orders placed or performed during the hospital encounter of 04/20/22 (from the past 48 hour(s))  Hemoglobin A1c     Status: None   Collection Time: 04/25/22  6:27 PM  Result Value Ref Range   Hgb A1c MFr Bld 5.2 4.8 - 5.6 %    Comment: (NOTE)         Prediabetes: 5.7 - 6.4         Diabetes: >6.4         Glycemic control for adults with diabetes: <7.0    Mean Plasma Glucose 103 mg/dL    Comment: (NOTE) Performed At: [facility] [place], [place] [id number] Rush Farmer MD HY:[id number]   HIV Antibody (routine testing w rflx)     Status: None   Collection Time: 04/25/22  6:27 PM  Result Value Ref Range   HIV Screen 4th Generation wRfx Non Reactive Non Reactive    Comment: Performed at [facility], [place] [address], [place], [place] [address]   Hepatitis panel, acute     Status: None   Collection Time: 04/25/22  6:27 PM  Result Value Ref Range   Hepatitis B Surface Ag NON REACTIVE NON REACTIVE   HCV Ab NON REACTIVE NON REACTIVE    Comment: (NOTE) Nonreactive HCV antibody screen is consistent with no HCV infections,  unless recent infection is suspected or other evidence exists to indicate HCV infection.     Hep A IgM NON REACTIVE NON REACTIVE   Hep B C IgM NON REACTIVE NON REACTIVE    Comment: Performed at [facility], [place] [address], [place], [place] [address]    Blood Alcohol level:  Lab Results  Component Value Date   ETH <10 04/02/2022   ETH <10 04/20/7251    Metabolic Disorder Labs: Lab Results  Component Value Date   HGBA1C 5.2 04/25/2022   MPG 103 04/25/2022   MPG 105 04/02/2022   Lab Results  Component Value Date   PROLACTIN 28.5 (H) 08/31/2016   Lab Results  Component Value Date   CHOL 201 (H) 04/02/2022   TRIG 207 (H) 04/02/2022   HDL 56 04/02/2022   CHOLHDL 3.6 04/02/2022   VLDL 41 (H) 04/02/2022   LDLCALC 104 (H) 04/02/2022   LDLCALC 76 08/31/2016   Physical Findings: AIMS: 0 CIWA: n/a  COWS: n/a    Musculoskeletal: Strength & Muscle Tone: within normal limits Gait & Station: normal Patient leans: N/A  Psychiatric Specialty Exam:  Presentation  General Appearance:  Appropriate for Environment; Casual; Fairly Groomed  Eye Contact: Fair  Speech: Clear and Coherent  Speech Volume: Normal  Handedness: Right  Mood and Affect  Mood: Depressed  Affect: Congruent   Thought Process  Thought Processes: Coherent  Descriptions of Associations:Intact  Orientation:Full (Time, Place and Person)  Thought Content:Logical  History of Schizophrenia/Schizoaffective disorder:No  Duration of Psychotic Symptoms:N/A  Hallucinations:Hallucinations: None  Ideas of Reference:None  Suicidal Thoughts:Suicidal Thoughts: No  Homicidal Thoughts:Homicidal Thoughts:  No   Sensorium  Memory: Immediate Good  Judgment: Fair  Insight: Fair   Art therapist  Concentration: Fair  Attention Span: Fair  Recall: Fair  Fund of Knowledge: Fair  Language: Fair   Psychomotor Activity  Psychomotor Activity: Psychomotor Activity: Normal   Assets  Assets: Communication Skills   Sleep  Sleep: Sleep: Good    Physical Exam: Physical Exam Constitutional:      Appearance: Normal appearance.  HENT:     Head: Normocephalic.     Nose: Nose normal.  Eyes:     Pupils: Pupils are equal, round, and reactive to light.  Pulmonary:     Effort: Pulmonary effort is normal.  Musculoskeletal:        General: Normal range of motion.     Cervical back: Normal range of motion.  Neurological:     Mental Status: He is alert and oriented to person, place, and time.  Psychiatric:        Thought Content: Thought content normal.    Review of Systems  Constitutional: Negative.   HENT: Negative.    Eyes: Negative.   Respiratory: Negative.    Cardiovascular: Negative.   Gastrointestinal: Negative.   Genitourinary: Negative.   Musculoskeletal: Negative.   Skin: Negative.   Neurological: Negative.   Psychiatric/Behavioral:  Positive for depression and substance abuse. Negative for hallucinations, memory loss and suicidal ideas. The patient is nervous/anxious and has insomnia.    Blood pressure 115/78, pulse 83, temperature 98.4 F (36.9 C), temperature source Oral, resp. rate 14, height 5\' 7"  (1.702 m), weight 71.2 kg, SpO2 97 %. Body mass index is 24.59 kg/m.  Treatment Plan Summary: Daily contact with patient to assess and evaluate symptoms and progress in treatment and Medication management   Observation Level/Precautions:  15 minute checks  Laboratory:  Labs reviewed   Psychotherapy:  Unit Group sessions  Medications:  See [facility]  Consultations:  To be determined   Discharge Concerns:  Safety, medication compliance, mood stability   Estimated LOS: 5-7 days  Other:  N/A    Labs Reviewed independently on 04/23/2021: Lipid panel WNL, hemoglobin A1c WNL, TSH WNL, CMP WNL.  CBC with elevated WBCs-Repeatd CBC from 1/5 show CBC WNL at 6.1.  EKG from 12/16 with QTc of 440. vitamin D levels low at 16.40. Repleplenishing with vitamin d 50.000 units weekly. vitamin B12 WNL and B1 levels pending. tox screen from the [facility] positive for cocaine, other substances of abuse negative.  Patient however reports that he has not used any recreational substances for 4 months.  UA from [facility] negative for UTIs   PLAN Safety and Monitoring: Voluntary admission to inpatient psychiatric unit for safety, stabilization and treatment Daily contact with patient to assess and evaluate symptoms and progress in treatment Patient's case to be discussed in multi-disciplinary team meeting Observation Level : q15 minute checks Vital signs: q12 hours Precautions:Safety   Long Term Goal(s): Improvement in symptoms so as ready for discharge   Short Term Goals: Ability to identify changes in lifestyle to reduce recurrence of condition will improve, Ability to disclose and discuss suicidal ideas, Ability to identify and develop effective coping behaviors will  improve, Compliance with prescribed medications will improve, and Ability to identify triggers associated with substance abuse/mental health issues will improve   Diagnoses  Principal Problem:   MDD (major depressive disorder), recurrent severe, without psychosis (HCC) Active Problems:   Cocaine use disorder, severe, dependence (HCC)   Insomnia   Social anxiety disorder   PTSD (post-traumatic stress disorder)   Medications -Continue Melatonin 5 mg nightly for sleep -Previously discontinued Seroquel XL 50 mg daily in the mornings on 1/8 (only had med for 2 days) -Continue Vitamin D 50,000 units for vitamin D deficiency -Continue Prazosin 1 mg nightly for nightmares -Discontinued Abilify on  admission-as per pt's preference-states medication is ineffective -Tapering off Seroquel IR as per pt request so he can get into the teen challenge program: Decrease dose from 75 mg to 50 mg on 1/8, decrease dose to 25 mg x 2 doses on 1/9 and 1/10, then stop  -Discontinued trazodone on admission-patient states medication is not effective for sleep -Tapering off gabapentin 600 mg TID per pt request. Dose reduced to 400 mg on 1/8 x 1 dose, then reduced to 300 mg TID starting 1/9. Further reduce Gabapentin to 200 mg TID on 1/10, and 100 mg TID on 1/11, then stop it. -Increase Hydroxyzine 50  QID due to worsening anxiety as pt is weaned off Gabapentin -Start Vitamin D 50.000 units weekly for vitamin D deficiency -Continue Wellbutrin XL 150 mg for depressive symptoms **Patient educated on rationales, benefits, and possible side effects of all medications.  He verbalizes understanding and is agreeable to trials of all medications as listed above.   Other PRNS -Continue agitation protocol: Continue Zyprexa 10 mg IM or p.o. to Zyprexa 5 mg PO/IM for agitation twice daily as needed -Continue Tylenol 650 mg every 6 hours PRN for mild pain -Continue Maalox 30 mg every 4 hrs PRN for indigestion -Continue Milk of Magnesia as needed every 6 hrs for constipation   Discharge Planning: Social work and case management to assist with discharge planning and identification of hospital follow-up needs prior to discharge Estimated LOS: 5-7 days Discharge Concerns: Need to establish a safety plan; Medication compliance and effectiveness Discharge Goals: Return home with outpatient referrals for mental health follow-up including medication management/psychotherapy   I certify that inpatient services furnished can reasonably be expected to improve the patient's condition.    Starleen Blue, NP 04/27/2022, 3:22 PMPatient ID: Theodore Carr, male   DOB: July 26, 1985, 37 y.o.   MRN: [id number] Patient ID: Theodore Carr, male    DOB: [birth year], 37 y.o.

## 2022-04-27 NOTE — Progress Notes (Signed)
  D: Patient alert and oriented x 4, calm and cooperative. Patient stated "I'm coming off of Gabapentin, it's making me crazy, irritable, sick in my stomach, and low energy level." Patient also reports his anxiety 8/10 and depression 8/10, he rated his overall mood 5/10 and stated that his goal was to "pray" because he is an addict. Patient denies SI,HI, AVH, & pain at this time. Patient reports he attended groups today and that has really helped him.  A: Patient received support and encouragement from staff. Education on safety and medication provided. Patient received all HS scheduled medications  per [facility] without any difficulty. Patient encouraged to report any medication adverse effects to the nurse. Patient verbally contracted for safety, and safety maintained at Q 15 minutes observation.   R: Patient demonstrated understanding to report any physical symptoms. No Adverse drug reactions observed. Patient kept safe on the unit, Q 15 minutes safety checks ongoing.

## 2022-04-27 NOTE — Progress Notes (Signed)
Psychoeducational Group Note  Date:  04/27/2022 Time:  2119  Group Topic/Focus:  Relapse Prevention Planning:   The focus of this group is to define relapse and discuss the need for planning to combat relapse.  Participation Level: Did Not Attend  Participation Quality:  Not Applicable  Affect:  Not Applicable  Cognitive:  Not Applicable  Insight:  Not Applicable  Engagement in Group: Not Applicable  Additional Comments:  The patient did not attend the group.   Archie Balboa S 04/27/2022, 9:19 PM

## 2022-04-27 NOTE — Group Note (Signed)
Date:  04/27/2022 Time:  3:42 PM  Group Topic/Focus:  Dimensions of Wellness:   The focus of this group is to introduce the topic of wellness and discuss the role each dimension of wellness plays in total health.    Participation Level:  Active  Participation Quality:  Appropriate  Affect:  Appropriate  Cognitive:  Appropriate  Insight: Appropriate  Engagement in Group:  Engaged  Modes of Intervention:  Discussion  Additional Comments: patient wants to be cognizant of not letting anyone steal his joy as evidenced by staying consistent in acknowledging his self worth.  Jerrye Beavers 04/27/2022, 3:42 PM

## 2022-04-27 NOTE — Progress Notes (Signed)
D: Patient is alert, oriented, and cooperative. Denies SI, HI, AVH, and verbally contracts for safety. Patient reports he slept good last night with sleeping medication. Patient reports his appetite as good, energy level as normal, and concentration as good. Patient rates his depression 7/10, hopelessness 7/10, and anxiety 8/10. Patient reports back pain and headaches that pain medication does help with. Patient states his goal is "dealing with coming off certain meds".   A: Scheduled medications administered per MD order. Support provided. Patient educated on safety on the unit and medications. Routine safety checks every 15 minutes. Patient stated understanding to tell nurse about any new physical symptoms. Patient understands to tell staff of any needs.     R: No adverse drug reactions noted. Patient verbally contracts for safety. Patient remains safe at this time and will continue to monitor.    04/27/22 0800  Psych Admission Type (Psych Patients Only)  Admission Status Voluntary  Psychosocial Assessment  Patient Complaints Irritability  Eye Contact Fair  Facial Expression Anxious  Affect Depressed  Speech Logical/coherent  Interaction Assertive  Motor Activity Fidgety  Appearance/Hygiene Unremarkable  Behavior Characteristics Cooperative  Mood Anxious  Thought Process  Coherency WDL  Content WDL  Delusions None reported or observed  Perception WDL  Hallucination None reported or observed  Judgment Poor  Confusion None  Danger to Self  Current suicidal ideation? Denies  Self-Injurious Behavior No self-injurious ideation or behavior indicators observed or expressed   Agreement Not to Harm Self Yes  Description of Agreement verbal contract for safety  Danger to Others  Danger to Others None reported or observed

## 2022-04-28 MED ORDER — WHITE PETROLATUM EX OINT
TOPICAL_OINTMENT | CUTANEOUS | Status: AC
  Filled 2022-04-28: qty 5

## 2022-04-28 NOTE — Progress Notes (Signed)
PPD read.  No redness or induration noted.  Negative PPD.

## 2022-04-28 NOTE — Progress Notes (Signed)
Pt said he feels "very irritable, agitated, and angry."  Pt said he was going to cuss out another nurse  earlier because he's so frustrated and going off the Neurontin is really making me jittery" Prn Zyprexa given per [facility].

## 2022-04-28 NOTE — Progress Notes (Signed)
Psychoeducational Group Note  Date:  04/28/2022 Time:  2015  Group Topic/Focus:  Wrap up group.  Participation Level: Did Not Attend  Participation Quality:  Not Applicable  Affect:  Not Applicable  Cognitive:  Not Applicable  Insight:  Not Applicable  Engagement in Group: Not Applicable  Additional Comments:  Did not attend.   Shellia Cleverly 04/28/2022, 8:45 PM

## 2022-04-28 NOTE — Progress Notes (Signed)
[facility] MD Progress Note  04/28/2022 4:51 PM Theodore Carr  MRN:  [id number] Principal Problem: MDD (major depressive disorder), recurrent severe, without psychosis (HCC) Diagnosis: Principal Problem:   MDD (major depressive disorder), recurrent severe, without psychosis (HCC) Active Problems:   Cocaine use disorder, severe, dependence (HCC)   Insomnia   Social anxiety disorder   PTSD (post-traumatic stress disorder)  Reason For Admission: Theodore Carr is a 37 yo Caucasian male with a self reported past mental health history of MDD, bipolar 2 d/o, social anxiety, PTSD, and paranoia, who presented to the [facility] health, [place] with complaints of worsening suicide thoughts with plan to hang himself and homicidal ideations towards his room mate. Pt was transferred voluntarily to this [facility] for treatment and stabilization of his mental status.   24 hr chart review: Vital signs within normal limits with slight elevations in BP in the last 24 hrs (138/90 today afternoon).  As needed medications given in the past 24 hours of ibuprofen, Zyprexa 5 mg, and Tylenol.  Patient has been compliant with all of his medications in the past 24 hours.  He has attended some unit group activities.  No behavioral issues noted in the past 24 hours. However, again, pt was given agitation protocol medication (Zyprexa 5 mg) twice in the last 24hrs-there is no documentation as to why this medication was required.  Sleep hours are documented for last night as 8.  Patient assessment note, 04/28/2021: Pt presents today with a significantly less depressed mood than at admission, attention to personal hygiene and grooming is fair, eye contact is good, speech is clear & coherent. Thought contents are organized and logical, and pt currently denies SI/HI/AVH or paranoia. There is no evidence of delusional thoughts.    Patient reports a good sleep quality last night, reports a good appetite, reports some back pain of 5-6, 10 being worst,  states that the pain is relieved with ibuprofen.  He reports that he is tolerating coming off the Seroquel and gabapentin, feeling irritable at times, but hydroxyzine is helpful for anxiety.  Tuberculin injection given 2 days ago and left forearm area.  Area assessed today afternoon, and area with no redness, no induration, and with no edema.  It can be concluded at this time that PPD is negative for tuberculosis.  Hepatitis B is nonreactive, hepatitis C is nonreactive, HIV antibody is nonreactive, hemoglobin A1c is within normal limits at 5.2, respiratory panel is negative for RSV, flu a and B, and COVID.  Patient will be discharging tomorrow to the teen challenge program as per his assigned CSW. We are continuing all of the patient's medications as listed below.  Last dose of gabapentin will be given tonight (100 mg).  Total Time spent with patient: 45 minutes  Past Psychiatric History: See H & P  Past Medical History:  Past Medical History:  Diagnosis Date   Compression fracture of T12 vertebra (HCC) 2008   Depression    Hypertension    History reviewed. No pertinent surgical history. Family History:  Family History  Problem Relation Age of Onset   Drug abuse Cousin    Family Psychiatric  History: See H & P Social History:  Social History   Substance and Sexual Activity  Alcohol Use Yes   Alcohol/week: 2.0 standard drinks of alcohol   Types: 2 Cans of beer per week   Comment: last used 4 days ago in prison     Social History   Substance and Sexual Activity  Drug  Use Yes   Frequency: 2.0 times per week   Types: Cocaine, Marijuana   Comment: last used 4 days ago    Social History   Socioeconomic History   Marital status: Single    Spouse name: Not on file   Number of children: Not on file   Years of education: 12   Highest education level: GED or equivalent  Occupational History   Not on file  Tobacco Use   Smoking status: Former    Packs/day: 1.00    Years: 15.00     Total pack years: 15.00    Types: Cigarettes    Quit date: 03/18/2022    Years since quitting: 0.1   Smokeless tobacco: Never  Vaping Use   Vaping Use: Never used  Substance and Sexual Activity   Alcohol use: Yes    Alcohol/week: 2.0 standard drinks of alcohol    Types: 2 Cans of beer per week    Comment: last used 4 days ago in prison   Drug use: Yes    Frequency: 2.0 times per week    Types: Cocaine, Marijuana    Comment: last used 4 days ago   Sexual activity: Yes  Other Topics Concern   Not on file  Social History Narrative   Not on file   Social Determinants of Health   Financial Resource Strain: Not on file  Food Insecurity: No Food Insecurity (04/20/2022)   Hunger Vital Sign    Worried About Running Out of Food in the Last Year: Never true    Ran Out of Food in the Last Year: Never true  Transportation Needs: Unknown (04/20/2022)   PRAPARE - Administrator, Civil Service (Medical): Patient refused    Lack of Transportation (Non-Medical): Patient refused  Physical Activity: Not on file  Stress: Not on file  Social Connections: Not on file   Sleep: Poor  Appetite:  Fair  Current Medications: Current Facility-Administered Medications  Medication Dose Route Frequency Provider Last Rate Last Admin   acetaminophen (TYLENOL) tablet 650 mg  650 mg Oral Q6H PRN Lenard Lance, FNP   650 mg at 04/28/22 1108   alum & mag hydroxide-simeth (MAALOX/MYLANTA) 200-200-20 MG/5ML suspension 30 mL  30 mL Oral Q4H PRN Lenard Lance, FNP       buPROPion (WELLBUTRIN XL) 24 hr tablet 150 mg  150 mg Oral Daily Lenard Lance, FNP   150 mg at 04/28/22 0801   fenofibrate tablet 160 mg  160 mg Oral Daily Braylie Badami, Tyler Aas, NP   160 mg at 04/28/22 0802   gabapentin (NEURONTIN) capsule 100 mg  100 mg Oral TID Starleen Blue, NP   100 mg at 04/28/22 1402   hydrOXYzine (ATARAX) tablet 50 mg  50 mg Oral QID Starleen Blue, NP   50 mg at 04/28/22 1402   ibuprofen (ADVIL) tablet 600 mg   600 mg Oral Q6H PRN Starleen Blue, NP   600 mg at 04/28/22 0803   magnesium hydroxide (MILK OF MAGNESIA) suspension 30 mL  30 mL Oral Daily PRN Lenard Lance, FNP       melatonin tablet 5 mg  5 mg Oral QHS Joban Colledge, NP   5 mg at 04/27/22 2000   menthol-cetylpyridinium (CEPACOL) lozenge 3 mg  1 lozenge Oral PRN Starleen Blue, NP   3 mg at 04/25/22 1333   OLANZapine zydis (ZYPREXA) disintegrating tablet 5 mg  5 mg Oral BID PRN Starleen Blue, NP   5 mg  at 04/28/22 1109   Or   OLANZapine (ZYPREXA) injection 5 mg  5 mg Intramuscular BID PRN Nicholes Rough, NP       prazosin (MINIPRESS) capsule 1 mg  1 mg Oral QHS Tava Peery, NP   1 mg at 04/27/22 2000   Vitamin D (Ergocalciferol) (DRISDOL) 1.25 MG (50000 UNIT) capsule 50,000 Units  50,000 Units Oral Q7 days Nicholes Rough, NP   50,000 Units at 04/22/22 1555    Lab Results:  No results found for this or any previous visit (from the past 48 hour(s)).   Blood Alcohol level:  Lab Results  Component Value Date   ETH <10 04/02/2022   ETH <10 78/67/6720    Metabolic Disorder Labs: Lab Results  Component Value Date   HGBA1C 5.2 04/25/2022   MPG 103 04/25/2022   MPG 105 04/02/2022   Lab Results  Component Value Date   PROLACTIN 28.5 (H) 08/31/2016   Lab Results  Component Value Date   CHOL 201 (H) 04/02/2022   TRIG 207 (H) 04/02/2022   HDL 56 04/02/2022   CHOLHDL 3.6 04/02/2022   VLDL 41 (H) 04/02/2022   LDLCALC 104 (H) 04/02/2022   LDLCALC 76 08/31/2016   Physical Findings: AIMS: 0 CIWA: n/a  COWS: n/a    Musculoskeletal: Strength & Muscle Tone: within normal limits Gait & Station: normal Patient leans: N/A  Psychiatric Specialty Exam:  Presentation  General Appearance:  Appropriate for Environment; Casual; Fairly Groomed  Eye Contact: Fair  Speech: Clear and Coherent  Speech Volume: Normal  Handedness: Right   Mood and Affect  Mood: Euthymic  Affect: Congruent   Thought Process   Thought Processes: Coherent  Descriptions of Associations:Intact  Orientation:Full (Time, Place and Person)  Thought Content:Logical  History of Schizophrenia/Schizoaffective disorder:No  Duration of Psychotic Symptoms:N/A  Hallucinations:Hallucinations: None  Ideas of Reference:None  Suicidal Thoughts:Suicidal Thoughts: No  Homicidal Thoughts:Homicidal Thoughts: No   Sensorium  Memory: Immediate Good  Judgment: Fair  Insight: Fair   Community education officer  Concentration: Fair  Attention Span: Good  Recall: Good  Fund of Knowledge: Good  Language: Good   Psychomotor Activity  Psychomotor Activity: Psychomotor Activity: Normal   Assets  Assets: Communication Skills   Sleep  Sleep: Sleep: Good    Physical Exam: Physical Exam Constitutional:      Appearance: Normal appearance.  HENT:     Head: Normocephalic.     Nose: Nose normal.  Eyes:     Pupils: Pupils are equal, round, and reactive to light.  Pulmonary:     Effort: Pulmonary effort is normal.  Musculoskeletal:        General: Normal range of motion.     Cervical back: Normal range of motion.  Neurological:     Mental Status: He is alert and oriented to person, place, and time.  Psychiatric:        Thought Content: Thought content normal.    Review of Systems  Constitutional: Negative.   HENT: Negative.    Eyes: Negative.   Respiratory: Negative.    Cardiovascular: Negative.   Gastrointestinal: Negative.   Genitourinary: Negative.   Musculoskeletal: Negative.   Skin: Negative.   Neurological: Negative.   Psychiatric/Behavioral:  Positive for depression and substance abuse. Negative for hallucinations, memory loss and suicidal ideas. The patient is nervous/anxious and has insomnia.    Blood pressure (!) 138/90, pulse 88, temperature 98.4 F (36.9 C), temperature source Oral, resp. rate 14, height 5\' 7"  (1.702 m), weight 71.2 kg,  SpO2 97 %. Body mass index is 24.59  kg/m.  Treatment Plan Summary: Daily contact with patient to assess and evaluate symptoms and progress in treatment and Medication management   Observation Level/Precautions:  15 minute checks  Laboratory:  Labs reviewed   Psychotherapy:  Unit Group sessions  Medications:  See [facility]  Consultations:  To be determined   Discharge Concerns:  Safety, medication compliance, mood stability  Estimated LOS: 5-7 days  Other:  N/A    Labs Reviewed independently on 04/23/2021: Lipid panel WNL, hemoglobin A1c WNL, TSH WNL, CMP WNL.  CBC with elevated WBCs-Repeatd CBC from 1/5 show CBC WNL at 6.1.  EKG from 12/16 with QTc of 440. vitamin D levels low at 16.40. Repleplenishing with vitamin d 50.000 units weekly. vitamin B12 WNL and B1 levels pending. tox screen from the [place] positive for cocaine, other substances of abuse negative.  Patient however reports that he has not used any recreational substances for 4 months.  UA from [place] negative for UTIs   PLAN Safety and Monitoring: Voluntary admission to inpatient psychiatric unit for safety, stabilization and treatment Daily contact with patient to assess and evaluate symptoms and progress in treatment Patient's case to be discussed in multi-disciplinary team meeting Observation Level : q15 minute checks Vital signs: q12 hours Precautions:Safety   Long Term Goal(s): Improvement in symptoms so as ready for discharge   Short Term Goals: Ability to identify changes in lifestyle to reduce recurrence of condition will improve, Ability to disclose and discuss suicidal ideas, Ability to identify and develop effective coping behaviors will improve, Compliance with prescribed medications will improve, and Ability to identify triggers associated with substance abuse/mental health issues will improve   Diagnoses  Principal Problem:   MDD (major depressive disorder), recurrent severe, without psychosis (HCC) Active Problems:   Cocaine use disorder, severe,  dependence (HCC)   Insomnia   Social anxiety disorder   PTSD (post-traumatic stress disorder)   Medications -Continue Melatonin 5 mg nightly for sleep -Previously discontinued Seroquel XL 50 mg daily in the mornings on 1/8 (only had med for 2 days) -Continue Vitamin D 50,000 units for vitamin D deficiency -Continue Prazosin 1 mg nightly for nightmares -Discontinued Abilify on admission-as per pt's preference-states medication is ineffective -Tapered off Seroquel IR as per pt request so he can get into the teen challenge program: Decrease dose from 75 mg to 50 mg on 1/8, decrease dose to 25 mg x 2 doses on 1/9 and 1/10, then stopped  -Discontinued trazodone on admission-patient states medication is not effective for sleep -Tapering off gabapentin 600 mg TID per pt request. Dose reduced to 400 mg on 1/8 x 1 dose, then reduced to 300 mg TID starting 1/9. Further reduce Gabapentin to 200 mg TID on 1/10, and 100 mg TID on 1/11 @ 2100, then stop it. -Continue Hydroxyzine 50  QID due to worsening anxiety as pt is weaned off Gabapentin -Continue Vitamin D 50.000 units weekly for vitamin D deficiency -Continue Wellbutrin XL 150 mg for depressive symptoms **Patient educated on rationales, benefits, and possible side effects of all medications.  He verbalizes understanding and is agreeable to trials of all medications as listed above.   Other PRNS -Continue agitation protocol: Continue Zyprexa 10 mg IM or p.o. to Zyprexa 5 mg PO/IM for agitation twice daily as needed -Continue Tylenol 650 mg every 6 hours PRN for mild pain -Continue Maalox 30 mg every 4 hrs PRN for indigestion -Continue Milk of Magnesia as needed every  6 hrs for constipation   Discharge Planning: Social work and case management to assist with discharge planning and identification of Carr follow-up needs prior to discharge Estimated LOS: 5-7 days Discharge Concerns: Need to establish a safety plan; Medication compliance and  effectiveness Discharge Goals: Return home with outpatient referrals for mental health follow-up including medication management/psychotherapy   I certify that inpatient services furnished can reasonably be expected to improve the patient's condition.    Nicholes Rough, NP 04/28/2022, 4:51 PMPatient ID: [name], male   DOB: 1985-11-11, 37 y.o.   MRN: [id number] Patient ID: Montel Vanderhoof, male   DOB: 06-Oct-1985, 37 y.o.   Patient ID: Jaymon Dudek, male   DOB: 04-10-1986, 37 y.o.   MRN: [id number]

## 2022-04-28 NOTE — Group Note (Unsigned)
LCSW Group Therapy Note   Group Date: 04/28/2022 Start Time: 1100 End Time: 1200   Type of Therapy and Topic:  Group Therapy: Boundaries  Participation Level:  {BHH PARTICIPATION LEVEL:22264}  Description of Group: This group will address the use of boundaries in their personal lives. Patients will explore why boundaries are important, the difference between healthy and unhealthy boundaries, and negative and postive outcomes of different boundaries and will look at how boundaries can be crossed.  Patients will be encouraged to identify current boundaries in their own lives and identify what kind of boundary is being set. Facilitators will guide patients in utilizing problem-solving interventions to address and correct types boundaries being used and to address when no boundary is being used. Understanding and applying boundaries will be explored and addressed for obtaining and maintaining a balanced life. Patients will be encouraged to explore ways to assertively make their boundaries and needs known to significant others in their lives, using other group members and facilitator for role play, support, and feedback.  Therapeutic Goals:  1.  Patient will identify areas in their life where setting clear boundaries could be  used to improve their life.  2.  Patient will identify signs/triggers that a boundary is not being respected. 3.  Patient will identify two ways to set boundaries in order to achieve balance in  their lives: 4.  Patient will demonstrate ability to communicate their needs and set boundaries  through discussion and/or role plays  Summary of Patient Progress:  *** was ***present/active throughout the session and proved open to feedback from CSW and peers. Patient demonstrated *** insight into the subject matter, was respectful of peers, and was present throughout the entire session.  Therapeutic Modalities:   Cognitive Behavioral Therapy Solution-Focused Therapy  Dywane Peruski S  Desteny Freeman, LCSWA 04/28/2022  1:22 PM    

## 2022-04-28 NOTE — BHH Counselor (Signed)
CSW attempted to contact the Pt's Research officer, trade union, Mrs. Rowe Robert [phone] (Cell phone)  but there was no answer.  CSW left a voicemail explaining that the Pt would be discharged on 04/29/2022 to [facility] in [place].  CSW asked for Mrs. Rowe Robert to return her call with any questions or concerns prior to discharge.    CSW contacted Vallery Ridge [phone] at [place] to inform him that the Pt's requested medical exams (Hep B & C, AID, and Covid/Flu) have been completed and that CSW is still waiting on documentation of the Pt's clear TB test.  Once these items are received CSW will fax them to Mr. Cherlynn Kaiser for admission purposes.  Mr. Cherlynn Kaiser asks that he be contacted tomorrow (04/30/2022) prior to the Pt's discharge so that transportation arrangements can be made.

## 2022-04-28 NOTE — Plan of Care (Signed)
  Problem: Education: Goal: Knowledge of disease or condition will improve Outcome: Progressing   Problem: Safety: Goal: Ability to remain free from injury will improve Outcome: Progressing   Problem: Education: Goal: Emotional status will improve Outcome: Progressing Goal: Mental status will improve Outcome: Progressing   Problem: Coping: Goal: Ability to demonstrate self-control will improve Outcome: Progressing

## 2022-04-28 NOTE — Group Note (Signed)
LCSW Group Therapy Note   Group Date: 04/28/2022 Start Time: 1100 End Time: 1200   Type of Therapy and Topic:  Group Therapy: Boundaries  Participation Level:  Active  Description of Group: This group will address the use of boundaries in their personal lives. Patients will explore why boundaries are important, the difference between healthy and unhealthy boundaries, and negative and postive outcomes of different boundaries and will look at how boundaries can be crossed.  Patients will be encouraged to identify current boundaries in their own lives and identify what kind of boundary is being set. Facilitators will guide patients in utilizing problem-solving interventions to address and correct types boundaries being used and to address when no boundary is being used. Understanding and applying boundaries will be explored and addressed for obtaining and maintaining a balanced life. Patients will be encouraged to explore ways to assertively make their boundaries and needs known to significant others in their lives, using other group members and facilitator for role play, support, and feedback.  Therapeutic Goals:  1.  Patient will identify areas in their life where setting clear boundaries could be  used to improve their life.  2.  Patient will identify signs/triggers that a boundary is not being respected. 3.  Patient will identify two ways to set boundaries in order to achieve balance in  their lives: 4.  Patient will demonstrate ability to communicate their needs and set boundaries  through discussion and/or role plays  Summary of Patient Progress:  Dagon was present/active throughout the session and proved open to feedback from [place] and peers. Patient demonstrated good insight into the subject matter, was respectful of peers, and was present throughout the entire session.  Therapeutic Modalities:   Cognitive Behavioral Therapy Solution-Focused Therapy  Windle Guard, LCSW 04/28/2022  1:33  PM

## 2022-04-28 NOTE — Progress Notes (Signed)
   04/28/22 1100  Psych Admission Type (Psych Patients Only)  Admission Status Voluntary  Psychosocial Assessment  Patient Complaints Anxiety;Irritability  Eye Contact Fair  Facial Expression Anxious  Affect Depressed;Anxious  Speech Logical/coherent  Interaction Defensive  Motor Activity Other (Comment) (WDL)  Appearance/Hygiene Unremarkable  Behavior Characteristics Anxious  Mood Anxious  Thought Process  Coherency WDL  Content WDL  Delusions None reported or observed  Perception WDL  Hallucination None reported or observed  Judgment WDL  Confusion None  Danger to Self  Current suicidal ideation? Denies  Danger to Others  Danger to Others None reported or observed

## 2022-04-28 NOTE — BHH Group Notes (Signed)
Adult Psychoeducational Group Note  Date:  04/28/2022 Time:  5:27 PM  Group Topic/Focus:  Managing Feelings:   The focus of this group is to identify what feelings patients have difficulty handling and develop a plan to handle them in a healthier way upon discharge.  Participation Level:  Minimal  Participation Quality:  Attentive  Affect:  Appropriate  Cognitive:  Alert and Appropriate  Insight: Appropriate and Limited  Engagement in Group:  Engaged  Modes of Intervention:  Discussion  Additional Comments:  Pt attended group but did not participate in discussion.  Vaden Becherer R Shatha Hooser 04/28/2022, 5:27 PM

## 2022-04-28 NOTE — Progress Notes (Signed)
   04/28/22 0544  15 Minute Checks  Location Bedroom  Visual Appearance Calm  Behavior Sleeping  Sleep (Behavioral Health Patients Only)  Calculate sleep? (Click Yes once per 24 hr at 0600 safety check) Yes  Documented sleep last 24 hours 8

## 2022-04-29 ENCOUNTER — Encounter (HOSPITAL_COMMUNITY): Payer: Self-pay

## 2022-04-29 LAB — QUANTIFERON-TB GOLD PLUS (RQFGPL)
QuantiFERON Mitogen Value: >10 IU/mL
QuantiFERON Nil Value: 0.02 IU/mL
QuantiFERON TB1 Ag Value: 0.02 IU/mL
QuantiFERON TB2 Ag Value: 0.03 IU/mL

## 2022-04-29 LAB — QUANTIFERON-TB GOLD PLUS: QuantiFERON-TB Gold Plus: NEGATIVE

## 2022-04-29 MED ORDER — HYDROXYZINE HCL 25 MG PO TABS: 25.0000 mg | ORAL_TABLET | Freq: Three times a day (TID) | ORAL | 0 refills | Status: DC | PRN

## 2022-04-29 MED ORDER — FENOFIBRATE 145 MG PO TABS: 145.0000 mg | ORAL_TABLET | Freq: Every day | ORAL | 0 refills | Status: DC

## 2022-04-29 MED ORDER — PRAZOSIN HCL 1 MG PO CAPS: 1.0000 mg | ORAL_CAPSULE | Freq: Every day | ORAL | 0 refills | Status: DC

## 2022-04-29 MED ORDER — BUPROPION HCL ER (XL) 150 MG PO TB24: 150.0000 mg | ORAL_TABLET | Freq: Every day | ORAL | 0 refills | Status: DC

## 2022-04-29 MED ORDER — HYDROXYZINE HCL 25 MG PO TABS
25.0000 mg | ORAL_TABLET | Freq: Three times a day (TID) | ORAL | Status: DC | PRN
  Filled 2022-04-29: qty 30

## 2022-04-29 MED ORDER — VITAMIN D (ERGOCALCIFEROL) 1.25 MG (50000 UNIT) PO CAPS: 50000.0000 [IU] | ORAL_CAPSULE | ORAL | 0 refills | Status: DC
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
## 2022-04-29 NOTE — Progress Notes (Signed)
  [facility] Adult Case Management Discharge Plan :  Will you be returning to the same living situation after discharge:  No. [place] discharge, do you have transportation home?: Yes,  [name] at [place]  Do you have the ability to pay for your medications: Yes,  Community Support   Release of information consent forms completed and in the chart;  Patient's signature needed at discharge.  Patient to Follow up at:  [place]. Go to.   Specialty: Behavioral Health Why: Please go to this provider for an assessment, to obtain therapy and medication management services on Monday through Friday, arrive no later than 7:20 am as services are first come, first served. Contact information: [place] [phone]                Next level of care provider has access to [place] and Suicide Prevention discussed: Yes,  with patient      Has patient been referred to the Quitline?: N/A patient is not a smoker  Patient has been referred for addiction treatment: Yes  Darleen Crocker, [place] 04/29/2022, 9:37 AM

## 2022-04-29 NOTE — BHH Group Notes (Signed)
Adult Psychoeducational Group Note  Date:  04/29/2022 Time:  10:02 AM  Group Topic/Focus:  Goals Group:   The focus of this group is to help patients establish daily goals to achieve during treatment and discuss how the patient can incorporate goal setting into their daily lives to aide in recovery.  Participation Level:  Active  Participation Quality:  Appropriate  Affect:  Appropriate  Cognitive:  Appropriate  Insight: Appropriate  Engagement in Group:  Engaged  Modes of Intervention:  Discussion  Additional Comments:  Patient gaol of the day is to go to [employer] [facility].   Marleta Lapierre R Saia Derossett 04/29/2022, 10:02 AM
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
## 2022-04-29 NOTE — BHH Suicide Risk Assessment (Signed)
Suicide Risk Assessment  Discharge Assessment    [facility] Discharge Suicide Risk Assessment   Principal Problem: MDD (major depressive disorder), recurrent severe, without psychosis ([place]) Discharge Diagnoses: Principal Problem:   MDD (major depressive disorder), recurrent severe, without psychosis ([place]) Active Problems:   Cocaine use disorder, severe, dependence ([place])   Insomnia   Social anxiety disorder   PTSD (post-traumatic stress disorder)  HPI: Theodore Carr is a 37 year old Caucasian male with past psychiatric history of cocaine use disorder severe dependence, major depressive disorder recurrent severe without psychosis, insomnia, social anxiety disorder, Post Traumatic Stress Disorder, Intermittent Explosive Disorder, substance induced mood disorder who presented to the [facility] with complaints of worsening suicide thoughts with plan to hang himself and homicidal ideations towards his room mate. Pt was transferred voluntarily to this [facility] for treatment and stabilization of his mental status.   24 hr assessment: Vital signs within normal limits 116/84, PR 92. PRN medications  given in the past 24 hours of Ibuprofen, Zyprexa 5 mg, and Tylenol.  Patient has been medication .  He has attended some unit group activities.  No behavioral issues noted in the past 24 hours. However, again, pt was given agitation protocol medication (Zyprexa 5 mg) twice in the last 24hrs for some breakthrough anxiety/agitation. Last dose of Gabapentin (100 mg) given. Patient noted to be sleeping throughout the night.    Assessment: Pt presents today with a dysphoric mood, blunt affect. Attention to personal hygiene and grooming is fair, eye contact is good, speech is clear & coherent. Thought contents are organized and logical, and pt currently denies SI/HI/AVH or paranoia. There is no evidence of delusional thoughts.     Patient reports improved quality sleep last night. He continues to report some  breakthrough agitation and feelings of irritability while coming off the Seroquel and gabapentin, feels Hydroxyzine is helpful. Expresses some relief and worry about transitioning to new placement. States having some nervousness about a new place and being off medications he's been 'used to' but feels he will 'be okay'. Reports feeling excited about going to a program where he will be able to have 7 months clean and learn work skills, continue his education and finish his parole; states he's never been able to complete or remain clean. Drug of choice is crack cocaine. Released from prison after serving a 5 year sentence. Lab work completed and negative as noted below. Patient will be discharging today to the [facility]. Patient medications continued as noted below.   Labs: PPD, Hepatitis B, C, HIV (non-reactive), RSV, Influenza A, B, COVID: negative  Total Time spent with patient: 45 minutes  Musculoskeletal: Strength & Muscle Tone: within normal limits Gait & Station: normal Patient leans: N/A  Psychiatric Specialty Exam  Presentation  General Appearance:  Appropriate for Environment; Casual  Eye Contact: Fair  Speech: Clear and Coherent  Speech Volume: Normal  Handedness: Right   Mood and Affect  Mood: Dysphoric  Duration of Depression Symptoms: Greater than two weeks  Affect: Congruent   Thought Process  Thought Processes: Coherent  Descriptions of Associations:Intact  Orientation:Full (Time, Place and Person)  Thought Content:Logical  History of Schizophrenia/Schizoaffective disorder:No  Duration of Psychotic Symptoms:N/A  Hallucinations:Hallucinations: None  Ideas of Reference:None  Suicidal Thoughts:Suicidal Thoughts: No  Homicidal Thoughts:Homicidal Thoughts: No   Sensorium  Memory: Immediate Fair; Recent Fair  Judgment: Fair  Insight: Fair   Community education officer  Concentration: Fair  Attention  Span: Fair  Recall: Fair  Fund of Knowledge: Fair  Language: Fair   Psychomotor Activity  Psychomotor Activity: Psychomotor Activity: Normal   Assets  Assets: Manufacturing systems engineer; Physical Health; Resilience; Housing   Sleep  Sleep: Sleep: Good   Physical Exam: Physical Exam Vitals and nursing note reviewed.  Constitutional:      Appearance: He is normal weight.  HENT:     Head: Normocephalic.     Nose: Nose normal.     Mouth/Throat:     Mouth: Mucous membranes are moist.     Pharynx: Oropharynx is clear.  Eyes:     Pupils: Pupils are equal, round, and reactive to light.  Cardiovascular:     Rate and Rhythm: Normal rate.     Pulses: Normal pulses.  Pulmonary:     Effort: Pulmonary effort is normal.  Abdominal:     Palpations: Abdomen is soft.  Musculoskeletal:        General: Normal range of motion.     Cervical back: Normal range of motion.  Skin:    General: Skin is warm and dry.  Neurological:     Mental Status: He is alert and oriented to person, place, and time.  Psychiatric:        Attention and Perception: Attention and perception normal.        Mood and Affect: Affect is blunt.        Speech: Speech normal.        Behavior: Behavior normal. Behavior is cooperative.        Thought Content: Thought content is not paranoid or delusional. Thought content does not include homicidal or suicidal ideation. Thought content does not include homicidal or suicidal plan.        Cognition and Memory: Cognition and memory normal.        Judgment: Judgment normal.    Review of Systems  Psychiatric/Behavioral:  Positive for substance abuse. Negative for depression, hallucinations and suicidal ideas. The patient is not nervous/anxious and does not have insomnia.   All other systems reviewed and are negative.  Blood pressure 116/84, pulse 92, temperature 98.4 F (36.9 C), temperature source Oral, resp. rate 16, height 5\' 7"  (1.702 m), weight 71.2 kg, SpO2  100 %. Body mass index is 24.59 kg/m.  Mental Status Per Nursing Assessment::   On Admission:  Thoughts of violence towards others, Self-harm behaviors  Demographic Factors:  Male, Adolescent or young adult, Caucasian, and Low socioeconomic status  Loss Factors: Decrease in vocational status, Legal issues, and Financial problems/change in socioeconomic status  Historical Factors: Family history of mental illness or substance abuse and Impulsivity  Risk Reduction Factors:   Responsible for children under 56 years of age and Sense of responsibility to family  Continued Clinical Symptoms:  Depression:   Comorbid alcohol abuse/dependence Alcohol/Substance Abuse/Dependencies  Cognitive Features That Contribute To Risk:  None    Suicide Risk:  Mild:  Suicidal ideation of limited frequency, intensity, duration, and specificity.  There are no identifiable plans, no associated intent, mild dysphoria and related symptoms, good self-control (both objective and subjective assessment), few other risk factors, and identifiable protective factors, including available and accessible social support.   Follow-up Information     [facility]. Go to.   Specialty: Behavioral Health Why: Please go to this provider for an assessment, to obtain therapy and medication management services on Monday through Friday, arrive no later than 7:20 am as services are first come, first served. Contact information: [address]  [place] [phone]                Plan Of Care/Follow-up recommendations:  Activity: as tolerated  Diet: heart healthy  Other: -Follow-up with your outpatient psychiatric provider -instructions on appointment date, time, and address (location) are provided to you in discharge paperwork.  -Take your psychiatric medications as prescribed at discharge - instructions are provided to you in the discharge paperwork  -Follow-up  with outpatient primary care doctor and other specialists -for management of preventative medicine and chronic medical disease, including:   -Testing: Follow-up with outpatient provider for abnormal lab results: Vitamin D, AST 44  -Recommend abstinence from alcohol, tobacco, and other illicit drug use at discharge.   -If your psychiatric symptoms recur, worsen, or if you have side effects to your psychiatric medications, call your outpatient psychiatric provider, 911, [phone] or go to the nearest emergency department.  -If suicidal thoughts recur, call your outpatient psychiatric provider, 911, [phone] or go to the nearest emergency department.  Inda Merlin, NP 04/29/2022, 10:15 AM

## 2022-04-29 NOTE — Group Note (Signed)
Recreation Therapy Group Note   Group Topic:Stress Management  Group Date: 04/29/2022 Start Time: 0935 End Time: 0950 Facilitators: Hassie Mandt-McCall, LRT,CTRS Location: [address] Dayroom   Goal Area(s) Addresses:  Patient will actively participate in stress management techniques presented during session.  Patient will successfully identify benefit of practicing stress management post d/c.   Group Description: Guided Imagery. LRT provided education, instruction, and demonstration on practice of visualization via guided imagery. Patient was asked to participate in the technique introduced during session. LRT debriefed including topics of mindfulness, stress management and specific scenarios each patient could use these techniques. Patients were given suggestions of ways to access scripts post d/c and encouraged to explore Youtube and other apps available on smartphones, tablets, and computers.   Affect/Mood: N/A   Participation Level: Did not attend    Clinical Observations/Individualized Feedback:     Plan: Continue to engage patient in RT group sessions 2-3x/week.   Spero Gunnels-McCall, LRT,CTRS 04/29/2022 12:07 PM

## 2022-04-29 NOTE — BH IP Treatment Plan (Signed)
Interdisciplinary Treatment and Diagnostic Plan Update  04/29/2022 Time of Session: 1120 Theodore Carr MRN: [id number]  Principal Diagnosis: MDD (major depressive disorder), recurrent severe, without psychosis (HCC)  Secondary Diagnoses: Principal Problem:   MDD (major depressive disorder), recurrent severe, without psychosis (HCC) Active Problems:   Cocaine use disorder, severe, dependence (HCC)   Insomnia   Social anxiety disorder   PTSD (post-traumatic stress disorder)   Current Medications:  Current Facility-Administered Medications  Medication Dose Route Frequency Provider Last Rate Last Admin   acetaminophen (TYLENOL) tablet 650 mg  650 mg Oral Q6H PRN Lenard Lance, FNP   650 mg at 04/29/22 1018   alum & mag hydroxide-simeth (MAALOX/MYLANTA) 200-200-20 MG/5ML suspension 30 mL  30 mL Oral Q4H PRN Lenard Lance, FNP       buPROPion (WELLBUTRIN XL) 24 hr tablet 150 mg  150 mg Oral Daily Lenard Lance, FNP   150 mg at 04/29/22 6213   fenofibrate tablet 160 mg  160 mg Oral Daily Starleen Blue, NP   160 mg at 04/29/22 0865   hydrOXYzine (ATARAX) tablet 50 mg  50 mg Oral QID Starleen Blue, NP   50 mg at 04/29/22 1110   ibuprofen (ADVIL) tablet 600 mg  600 mg Oral Q6H PRN Starleen Blue, NP   600 mg at 04/29/22 0810   magnesium hydroxide (MILK OF MAGNESIA) suspension 30 mL  30 mL Oral Daily PRN Lenard Lance, FNP       melatonin tablet 5 mg  5 mg Oral QHS Nkwenti, Doris, NP   5 mg at 04/28/22 1906   menthol-cetylpyridinium (CEPACOL) lozenge 3 mg  1 lozenge Oral PRN Starleen Blue, NP   3 mg at 04/25/22 1333   OLANZapine zydis (ZYPREXA) disintegrating tablet 5 mg  5 mg Oral BID PRN Starleen Blue, NP   5 mg at 04/28/22 1109   Or   OLANZapine (ZYPREXA) injection 5 mg  5 mg Intramuscular BID PRN Starleen Blue, NP       prazosin (MINIPRESS) capsule 1 mg  1 mg Oral QHS Nkwenti, Doris, NP   1 mg at 04/28/22 1906   Vitamin D (Ergocalciferol) (DRISDOL) 1.25 MG (50000 UNIT) capsule 50,000  Units  50,000 Units Oral Q7 days Starleen Blue, NP   50,000 Units at 04/22/22 1555   PTA Medications: Medications Prior to Admission  Medication Sig Dispense Refill Last Dose   ibuprofen (ADVIL) 600 MG tablet Take 600 mg by mouth every 8 (eight) hours as needed.      [DISCONTINUED] fenofibrate (TRICOR) 145 MG tablet Take 145 mg by mouth daily.      ARIPiprazole (ABILIFY) 10 MG tablet Take 1 tablet (10 mg total) by mouth daily. 30 tablet 0    gabapentin (NEURONTIN) 300 MG capsule Take 2 capsules (600 mg total) by mouth 3 (three) times daily. 180 capsule 0    traZODone (DESYREL) 50 MG tablet Take 1 tablet (50 mg total) by mouth at bedtime as needed for sleep. 30 tablet 0    [DISCONTINUED] buPROPion (WELLBUTRIN XL) 150 MG 24 hr tablet Take 1 tablet (150 mg total) by mouth daily. 30 tablet 0    [DISCONTINUED] hydrOXYzine (ATARAX) 25 MG tablet Take 1 tablet (25 mg total) by mouth 3 (three) times daily as needed for anxiety. 60 tablet 0     Patient Stressors:  Grief loss of mother. Legal issues and substance use  Patient Strengths:  Medication compliance  Treatment Modalities: Medication Management, Group therapy, Case management,  1  to 1 session with clinician, Psychoeducation, Recreational therapy.   Physician Treatment Plan for Primary Diagnosis: MDD (major depressive disorder), recurrent severe, without psychosis ([place]) Long Term Goal(s): Improvement in symptoms so as ready for discharge   Short Term Goals: Ability to identify changes in lifestyle to reduce recurrence of condition will improve Ability to disclose and discuss suicidal ideas Ability to identify and develop effective coping behaviors will improve Compliance with prescribed medications will improve Ability to identify triggers associated with substance abuse/mental health issues will improve  Medication Management: Evaluate patient's response, side effects, and tolerance of medication regimen.  Therapeutic Interventions: 1  to 1 sessions, Unit Group sessions and Medication administration.  Evaluation of Outcomes: Progressing  Physician Treatment Plan for Secondary Diagnosis: Principal Problem:   MDD (major depressive disorder), recurrent severe, without psychosis ([place]) Active Problems:   Cocaine use disorder, severe, dependence ([place])   Insomnia   Social anxiety disorder   PTSD (post-traumatic stress disorder)  Long Term Goal(s): Improvement in symptoms so as ready for discharge   Short Term Goals: Ability to identify changes in lifestyle to reduce recurrence of condition will improve Ability to disclose and discuss suicidal ideas Ability to identify and develop effective coping behaviors will improve Compliance with prescribed medications will improve Ability to identify triggers associated with substance abuse/mental health issues will improve     Medication Management: Evaluate patient's response, side effects, and tolerance of medication regimen.  Therapeutic Interventions: 1 to 1 sessions, Unit Group sessions and Medication administration.  Evaluation of Outcomes: Progressing   RN Treatment Plan for Primary Diagnosis: MDD (major depressive disorder), recurrent severe, without psychosis ([place]) Long Term Goal(s): Knowledge of disease and therapeutic regimen to maintain health will improve  Short Term Goals: Ability to remain free from injury will improve, Ability to verbalize frustration and anger appropriately will improve, Ability to demonstrate self-control, Ability to participate in decision making will improve, Ability to verbalize feelings will improve, Ability to disclose and discuss suicidal ideas, Ability to identify and develop effective coping behaviors will improve, and Compliance with prescribed medications will improve  Medication Management: RN will administer medications as ordered by provider, will assess and evaluate patient's response and provide education to patient for prescribed  medication. RN will report any adverse and/or side effects to prescribing provider.  Therapeutic Interventions: 1 on 1 counseling sessions, Psychoeducation, Medication administration, Evaluate responses to treatment, Monitor vital signs and CBGs as ordered, Perform/monitor CIWA, COWS, AIMS and Fall Risk screenings as ordered, Perform wound care treatments as ordered.  Evaluation of Outcomes: Progressing   LCSW Treatment Plan for Primary Diagnosis: MDD (major depressive disorder), recurrent severe, without psychosis ([place]) Long Term Goal(s): Safe transition to appropriate next level of care at discharge, Engage patient in therapeutic group addressing interpersonal concerns.  Short Term Goals: Engage patient in aftercare planning with referrals and resources, Increase social support, Increase ability to appropriately verbalize feelings, Increase emotional regulation, Facilitate acceptance of mental health diagnosis and concerns, Facilitate patient progression through stages of change regarding substance use diagnoses and concerns, Identify triggers associated with mental health/substance abuse issues, and Increase skills for wellness and recovery  Therapeutic Interventions: Assess for all discharge needs, 1 to 1 time with Social worker, Explore available resources and support systems, Assess for adequacy in community support network, Educate family and significant other(s) on suicide prevention, Complete Psychosocial Assessment, Interpersonal group therapy.  Evaluation of Outcomes: Progressing   Progress in Treatment: Attending groups: Yes. Participating in groups: Yes. Taking medication as prescribed:  Yes. Toleration medication: Yes. Family/Significant other contact made: Yes, individual(s) contacted:  Mother Patient understands diagnosis: Yes. Discussing patient identified problems/goals with staff: Yes. Medical problems stabilized or resolved: Yes. Denies suicidal/homicidal ideation:  Yes. Issues/concerns per patient self-inventory: Yes. Other:   New problem(s) identified: No, Describe:  None Reported  New Short Term/Long Term Goal(s): Housing/Sobriety  Patient Goals:  Sobriety   Discharge Plan or Barriers: homelessness  Reason for Continuation of Hospitalization: Anxiety Depression Medication stabilization Suicidal ideation Withdrawal symptoms  Estimated Length of Stay: 3-7 Days  Last 3 [place] Suicide Severity Risk Score: [place] Admission (Current) from 04/20/2022 in [facility]B Admission (Discharged) from 04/03/2022 in [facility] 400B ED from 04/02/2022 in [facility] High Risk High Risk No Risk       Last PHQ 2/9 Scores:    04/02/2022    6:33 PM 02/11/2015   10:00 AM  Depression screen PHQ 2/9  Decreased Interest 1 0  Down, Depressed, Hopeless 2 1  PHQ - 2 Score 3 1  Altered sleeping 1   Tired, decreased energy 1   Change in appetite 1   Feeling bad or failure about yourself  2   Trouble concentrating 1   Moving slowly or fidgety/restless 0   Suicidal thoughts 2   PHQ-9 Score 11   Difficult doing work/chores Very difficult   detox, medication management for mood stabilization; elimination of SI thoughts; development of comprehensive mental wellness/sobriety plan  OR   medication stabilization, elimination of SI thoughts, development of comprehensive mental wellness plan.    Scribe for Treatment Team: Windle Guard, LCSW 04/29/2022 1:13 PM

## 2022-04-29 NOTE — Progress Notes (Signed)
Pt left facility at this time via transport from [facility].  Pt removed all belongings, prescriptions, and verbalized understanding of medications and discharge instructions. Pt denies suicidal and homicidal thoughts as well as thoughts of self harm. Pt denies auditory and visual hallucinations.

## 2022-04-29 NOTE — Progress Notes (Signed)
   04/29/22 0601  15 Minute Checks  Location Bedroom  Visual Appearance Calm  Behavior Composed  Sleep (Behavioral Health Patients Only)  Calculate sleep? (Click Yes once per 24 hr at 0600 safety check) Yes  Documented sleep last 24 hours 8.5

## 2022-06-06 ENCOUNTER — Other Ambulatory Visit: Payer: Self-pay

## 2022-06-06 ENCOUNTER — Inpatient Hospital Stay (HOSPITAL_COMMUNITY)
Admission: AD | Admit: 2022-06-06 | Discharge: 2022-06-16 | DRG: 885 | Disposition: A | Discharge status: Home / Self Care | Payer: Medicaid Other | Source: Intra-hospital | Attending: Psychiatry | Admitting: Psychiatry

## 2022-06-06 ENCOUNTER — Encounter (HOSPITAL_COMMUNITY): Payer: Self-pay | Admitting: Psychiatry

## 2022-06-06 ENCOUNTER — Ambulatory Visit (HOSPITAL_COMMUNITY)
Admission: EM | Admit: 2022-06-06 | Discharge: 2022-06-06 | Disposition: A | Discharge status: Home / Self Care | Payer: Medicaid Other | Attending: Psychiatry | Admitting: Psychiatry

## 2022-06-06 ENCOUNTER — Other Ambulatory Visit (HOSPITAL_COMMUNITY): Admission: RE | Admit: 2022-06-06 | Payer: Self-pay | Source: Ambulatory Visit

## 2022-06-06 DIAGNOSIS — Z87891 Personal history of nicotine dependence: Secondary | ICD-10-CM

## 2022-06-06 DIAGNOSIS — G894 Chronic pain syndrome: Secondary | ICD-10-CM | POA: Insufficient documentation

## 2022-06-06 DIAGNOSIS — F1320 Sedative, hypnotic or anxiolytic dependence, uncomplicated: Secondary | ICD-10-CM | POA: Diagnosis present

## 2022-06-06 DIAGNOSIS — E785 Hyperlipidemia, unspecified: Secondary | ICD-10-CM | POA: Diagnosis present

## 2022-06-06 DIAGNOSIS — F602 Antisocial personality disorder: Secondary | ICD-10-CM | POA: Diagnosis present

## 2022-06-06 DIAGNOSIS — Z20822 Contact with and (suspected) exposure to covid-19: Secondary | ICD-10-CM | POA: Diagnosis present

## 2022-06-06 DIAGNOSIS — M549 Dorsalgia, unspecified: Secondary | ICD-10-CM | POA: Diagnosis present

## 2022-06-06 DIAGNOSIS — F1994 Other psychoactive substance use, unspecified with psychoactive substance-induced mood disorder: Secondary | ICD-10-CM | POA: Diagnosis present

## 2022-06-06 DIAGNOSIS — Z87820 Personal history of traumatic brain injury: Secondary | ICD-10-CM

## 2022-06-06 DIAGNOSIS — Z560 Unemployment, unspecified: Secondary | ICD-10-CM | POA: Diagnosis not present

## 2022-06-06 DIAGNOSIS — G4700 Insomnia, unspecified: Secondary | ICD-10-CM | POA: Diagnosis present

## 2022-06-06 DIAGNOSIS — I10 Essential (primary) hypertension: Secondary | ICD-10-CM | POA: Diagnosis present

## 2022-06-06 DIAGNOSIS — Z79899 Other long term (current) drug therapy: Secondary | ICD-10-CM | POA: Diagnosis not present

## 2022-06-06 DIAGNOSIS — E781 Pure hyperglyceridemia: Secondary | ICD-10-CM | POA: Diagnosis present

## 2022-06-06 DIAGNOSIS — F4310 Post-traumatic stress disorder, unspecified: Secondary | ICD-10-CM | POA: Diagnosis present

## 2022-06-06 DIAGNOSIS — R45850 Homicidal ideations: Secondary | ICD-10-CM | POA: Diagnosis present

## 2022-06-06 DIAGNOSIS — F1324 Sedative, hypnotic or anxiolytic dependence with sedative, hypnotic or anxiolytic-induced mood disorder: Secondary | ICD-10-CM | POA: Diagnosis present

## 2022-06-06 DIAGNOSIS — Z5900 Homelessness unspecified: Secondary | ICD-10-CM | POA: Diagnosis not present

## 2022-06-06 DIAGNOSIS — Z91148 Patient's other noncompliance with medication regimen for other reason: Secondary | ICD-10-CM | POA: Insufficient documentation

## 2022-06-06 DIAGNOSIS — F411 Generalized anxiety disorder: Secondary | ICD-10-CM | POA: Diagnosis present

## 2022-06-06 DIAGNOSIS — F6381 Intermittent explosive disorder: Secondary | ICD-10-CM | POA: Diagnosis present

## 2022-06-06 DIAGNOSIS — F319 Bipolar disorder, unspecified: Secondary | ICD-10-CM | POA: Diagnosis not present

## 2022-06-06 DIAGNOSIS — R45851 Suicidal ideations: Secondary | ICD-10-CM | POA: Diagnosis present

## 2022-06-06 DIAGNOSIS — F332 Major depressive disorder, recurrent severe without psychotic features: Principal | ICD-10-CM | POA: Diagnosis present

## 2022-06-06 DIAGNOSIS — Z1152 Encounter for screening for COVID-19: Secondary | ICD-10-CM | POA: Insufficient documentation

## 2022-06-06 LAB — CBC WITH DIFFERENTIAL/PLATELET
Abs Immature Granulocytes: 0.02 10*3/uL (ref 0.00–0.07)
Basophils Absolute: 0.1 10*3/uL (ref 0.0–0.1)
Basophils Relative: 1 %
Eosinophils Absolute: 0.3 10*3/uL (ref 0.0–0.5)
Eosinophils Relative: 3 %
HCT: 45.9 % (ref 39.0–52.0)
Hemoglobin: 15.3 g/dL (ref 13.0–17.0)
Immature Granulocytes: 0 %
Lymphocytes Relative: 29 %
Lymphs Abs: 2.4 10*3/uL (ref 0.7–4.0)
MCH: 30.5 pg (ref 26.0–34.0)
MCHC: 33.3 g/dL (ref 30.0–36.0)
MCV: 91.4 fL (ref 80.0–100.0)
Monocytes Absolute: 0.6 10*3/uL (ref 0.1–1.0)
Monocytes Relative: 7 %
Neutro Abs: 5 10*3/uL (ref 1.7–7.7)
Neutrophils Relative %: 60 %
Platelets: 326 10*3/uL (ref 150–400)
RBC: 5.02 MIL/uL (ref 4.22–5.81)
RDW: 12.7 % (ref 11.5–15.5)
WBC: 8.4 10*3/uL (ref 4.0–10.5)
nRBC: 0 % (ref 0.0–0.2)

## 2022-06-06 LAB — COMPREHENSIVE METABOLIC PANEL
ALT: 31 U/L (ref 0–44)
AST: 27 U/L (ref 15–41)
Albumin: 4.5 g/dL (ref 3.5–5.0)
Alkaline Phosphatase: 63 U/L (ref 38–126)
Anion gap: 11 (ref 5–15)
BUN: 11 mg/dL (ref 6–20)
CO2: 25 mmol/L (ref 22–32)
Calcium: 9.6 mg/dL (ref 8.9–10.3)
Chloride: 103 mmol/L (ref 98–111)
Creatinine, Ser: 1.07 mg/dL (ref 0.61–1.24)
GFR, Estimated: >60 mL/min (ref >60)
Glucose, Bld: 86 mg/dL (ref 70–99)
Potassium: 3.7 mmol/L (ref 3.5–5.1)
Sodium: 139 mmol/L (ref 135–145)
Total Bilirubin: 0.7 mg/dL (ref 0.3–1.2)
Total Protein: 7 g/dL (ref 6.5–8.1)

## 2022-06-06 LAB — RESP PANEL BY RT-PCR (RSV, FLU A&B, COVID)  RVPGX2
Influenza A by PCR: NEGATIVE
Influenza B by PCR: NEGATIVE
Resp Syncytial Virus by PCR: NEGATIVE
SARS Coronavirus 2 by RT PCR: NEGATIVE

## 2022-06-06 LAB — MAGNESIUM: Magnesium: 2 mg/dL (ref 1.7–2.4)

## 2022-06-06 LAB — POCT URINE DRUG SCREEN - MANUAL ENTRY (I-SCREEN)
POC Amphetamine UR: NOT DETECTED
POC Buprenorphine (BUP): NOT DETECTED
POC Cocaine UR: NOT DETECTED
POC Marijuana UR: NOT DETECTED
POC Methadone UR: NOT DETECTED
POC Methamphetamine UR: NOT DETECTED
POC Morphine: NOT DETECTED
POC Oxazepam (BZO): NOT DETECTED
POC Oxycodone UR: NOT DETECTED
POC Secobarbital (BAR): NOT DETECTED

## 2022-06-06 LAB — URINALYSIS, COMPLETE (UACMP) WITH MICROSCOPIC
Bilirubin Urine: NEGATIVE
Glucose, UA: NEGATIVE mg/dL
Hgb urine dipstick: NEGATIVE
Ketones, ur: NEGATIVE mg/dL
Leukocytes,Ua: NEGATIVE
Nitrite: NEGATIVE
Protein, ur: NEGATIVE mg/dL
Specific Gravity, Urine: <1.005 — ABNORMAL LOW (ref 1.005–1.030)
pH: 6 (ref 5.0–8.0)

## 2022-06-06 LAB — ETHANOL: Alcohol, Ethyl (B): <10 mg/dL (ref <10)

## 2022-06-06 MED ORDER — ZIPRASIDONE MESYLATE 20 MG IM SOLR: 20.0000 mg | INTRAMUSCULAR | Status: DC | PRN

## 2022-06-06 MED ORDER — ALUM & MAG HYDROXIDE-SIMETH 200-200-20 MG/5ML PO SUSP: 30.0000 mL | ORAL | Status: DC | PRN

## 2022-06-06 MED ORDER — LORAZEPAM 1 MG PO TABS: 1.0000 mg | ORAL_TABLET | ORAL | Status: DC | PRN

## 2022-06-06 MED ORDER — LORAZEPAM 1 MG PO TABS
1.0000 mg | ORAL_TABLET | ORAL | Status: AC | PRN
  Administered 2022-06-06: 1 mg via ORAL
  Filled 2022-06-06: qty 1

## 2022-06-06 MED ORDER — MAGNESIUM HYDROXIDE 400 MG/5ML PO SUSP: 30.0000 mL | Freq: Every day | ORAL | Status: DC | PRN

## 2022-06-06 MED ORDER — OLANZAPINE 5 MG PO TBDP
5.0000 mg | ORAL_TABLET | Freq: Three times a day (TID) | ORAL | Status: DC | PRN
  Administered 2022-06-06 – 2022-06-07 (×2): 5 mg via ORAL
  Filled 2022-06-06 (×2): qty 1

## 2022-06-06 MED ORDER — HYDROXYZINE HCL 25 MG PO TABS
25.0000 mg | ORAL_TABLET | Freq: Three times a day (TID) | ORAL | Status: DC | PRN
  Administered 2022-06-06: 25 mg via ORAL
  Filled 2022-06-06: qty 1

## 2022-06-06 MED ORDER — ACETAMINOPHEN 325 MG PO TABS
650.0000 mg | ORAL_TABLET | Freq: Four times a day (QID) | ORAL | Status: DC | PRN
  Administered 2022-06-07 – 2022-06-08 (×2): 650 mg via ORAL
  Filled 2022-06-06 (×2): qty 2

## 2022-06-06 MED ORDER — GABAPENTIN 300 MG PO CAPS: 600.0000 mg | ORAL_CAPSULE | Freq: Two times a day (BID) | ORAL | Status: DC

## 2022-06-06 MED ORDER — BUPROPION HCL ER (XL) 150 MG PO TB24: 150.0000 mg | ORAL_TABLET | Freq: Every day | ORAL | 0 refills | Status: DC

## 2022-06-06 MED ORDER — GABAPENTIN 300 MG PO CAPS
600.0000 mg | ORAL_CAPSULE | Freq: Two times a day (BID) | ORAL | Status: DC
  Administered 2022-06-06: 600 mg via ORAL
  Filled 2022-06-06: qty 2

## 2022-06-06 MED ORDER — TRAZODONE HCL 50 MG PO TABS: 50.0000 mg | ORAL_TABLET | Freq: Every evening | ORAL | Status: DC | PRN

## 2022-06-06 MED ORDER — ACETAMINOPHEN 325 MG PO TABS: 650.0000 mg | ORAL_TABLET | Freq: Four times a day (QID) | ORAL | Status: DC | PRN

## 2022-06-06 MED ORDER — BUPROPION HCL ER (XL) 150 MG PO TB24: 150.0000 mg | ORAL_TABLET | Freq: Every day | ORAL | Status: DC

## 2022-06-06 MED ORDER — TRAZODONE HCL 50 MG PO TABS
50.0000 mg | ORAL_TABLET | Freq: Every evening | ORAL | Status: DC | PRN
  Administered 2022-06-06 – 2022-06-15 (×10): 50 mg via ORAL
  Filled 2022-06-06 (×10): qty 1

## 2022-06-06 MED ORDER — WHITE PETROLATUM EX OINT
TOPICAL_OINTMENT | CUTANEOUS | Status: AC
  Filled 2022-06-06: qty 10

## 2022-06-06 MED ORDER — HYDROXYZINE HCL 25 MG PO TABS
25.0000 mg | ORAL_TABLET | Freq: Three times a day (TID) | ORAL | Status: DC | PRN
  Administered 2022-06-06 – 2022-06-07 (×2): 25 mg via ORAL
  Filled 2022-06-06 (×2): qty 1

## 2022-06-06 MED ORDER — GABAPENTIN 300 MG PO CAPS
600.0000 mg | ORAL_CAPSULE | Freq: Two times a day (BID) | ORAL | Status: DC
  Administered 2022-06-06 – 2022-06-07 (×2): 600 mg via ORAL
  Filled 2022-06-06 (×7): qty 2

## 2022-06-06 MED ORDER — OLANZAPINE 5 MG PO TBDP: 5.0000 mg | ORAL_TABLET | Freq: Three times a day (TID) | ORAL | Status: DC | PRN

## 2022-06-06 MED ORDER — BUPROPION HCL ER (XL) 150 MG PO TB24
150.0000 mg | ORAL_TABLET | Freq: Every day | ORAL | Status: DC
  Administered 2022-06-06: 150 mg via ORAL
  Filled 2022-06-06: qty 1

## 2022-06-06 MED ORDER — NICOTINE 14 MG/24HR TD PT24
14.0000 mg | MEDICATED_PATCH | Freq: Every day | TRANSDERMAL | Status: DC
  Filled 2022-06-06 (×5): qty 1

## 2022-06-06 MED ORDER — BUPROPION HCL ER (XL) 150 MG PO TB24
150.0000 mg | ORAL_TABLET | Freq: Every day | ORAL | Status: DC
  Administered 2022-06-07 – 2022-06-13 (×7): 150 mg via ORAL
  Filled 2022-06-06 (×9): qty 1

## 2022-06-06 MED ORDER — GABAPENTIN 600 MG PO TABS: 600.0000 mg | ORAL_TABLET | Freq: Two times a day (BID) | ORAL | 0 refills | Status: DC

## 2022-06-06 NOTE — Progress Notes (Signed)
   06/06/22 2200  Psych Admission Type (Psych Patients Only)  Admission Status Voluntary  Psychosocial Assessment  Patient Complaints Anxiety  Eye Contact Fair  Facial Expression Animated  Affect Appropriate to circumstance  Speech Logical/coherent  Interaction Assertive  Motor Activity Fidgety  Appearance/Hygiene Unremarkable  Behavior Characteristics Cooperative;Appropriate to situation  Mood Anxious  Thought Process  Coherency WDL  Content WDL  Delusions None reported or observed  Perception WDL  Hallucination None reported or observed  Judgment Poor  Confusion None  Danger to Self  Current suicidal ideation? Denies  Self-Injurious Behavior No self-injurious ideation or behavior indicators observed or expressed   Agreement Not to Harm Self Yes  Description of Agreement verbal  Danger to Others  Danger to Others None reported or observed  Danger to Others Abnormal  Harmful Behavior to others No threats or harm toward other people  Destructive Behavior No threats or harm toward property

## 2022-06-06 NOTE — Discharge Instructions (Addendum)
Transfer to [facility] for IP treatment, Dr. Caswell Corwin accepting md     [facility] [place], [place], [address] [phone] phone   New Patient Assessment/Therapy Walk-Ins:  Monday and Wednesday: 8 am until slots are full. Every 1st and 2nd Fridays of the month: 1 pm - 5 pm.  NO ASSESSMENT/THERAPY WALK-INS ON [place]  New Patient Assessment/Medication Management Walk-Ins:  Monday - Friday:  8 am - 11 am.  For all walk-ins, we ask that you arrive by 7:15- 7:30 am because patients will be seen in the order of arrival.  Availability is limited; therefore, you may not be seen on the same day that you walk-in.  Our goal is to serve and meet the needs of our community to the best of our ability.

## 2022-06-06 NOTE — Plan of Care (Signed)
Nurse discussed anxiety with pt
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
## 2022-06-06 NOTE — Group Note (Signed)
Occupational Therapy Group Note  Group Topic: Sleep Hygiene  Group Date: 06/06/2022 Start Time: 1430 End Time: 1505 Facilitators: Brantley Stage, OT   Group Description: Group encouraged increased participation and engagement through topic focused on sleep hygiene. Patients reflected on the quality of sleep they typically receive and identified areas that need improvement. Group was given background information on sleep and sleep hygiene, including common sleep disorders. Group members also received information on how to improve one's sleep and introduced a sleep diary as a tool that can be utilized to track sleep quality over a length of time. Group session ended with patients identifying one or more strategies they could utilize or implement into their sleep routine in order to improve overall sleep quality.        Therapeutic Goal(s):  Identify one or more strategies to improve overall sleep hygiene  Identify one or more areas of sleep that are negatively impacted (sleep too much, too little, etc)     Participation Level: Engaged   Participation Quality: Independent   Behavior: Appropriate   Speech/Thought Process: Barely audible   Affect/Mood: Appropriate   Insight: Fair   Judgement: Fair   Individualization: pt was passively engaged in their participation of group discussion/activity. New skills were identified  Modes of Intervention: Discussion  Patient Response to Interventions:  Attentive   Plan: Continue to engage patient in OT groups 2 - 3x/week.  06/06/2022  Brantley Stage, OT  Cornell Barman, OT

## 2022-06-06 NOTE — ED Triage Notes (Signed)
Pt presents to [facility] accompanied by a worker at the residential program that he is residing at, seeking a mental health evaluation. Pt reports worsening HI towards staff members at the program for the past 3 days. Pt reports he thought of a plan to use a knife to harm them. Pt states he notified staff of the thoughts he was having and they brought him to this facility for further assistance.Pt states he has been without medication for about 1 month because the program originally did not allow him to take psychotropic medications. Pt reports he is diagnosed with PTSD, Bipolar I, social anxiety and Intermittent explosive disorder. Pt states without his medication he begins to have violent thoughts. Pt also reports passive SI with no clear plan at this time. Pt reports 2 prior suicide attempts, one last month where he tried to hang himself and another in 2013 where he attemped to overdose on medication. Pt reports being hospitalized both times. Pt states he would like to get stabilized on his medications. Pt denies AVH at this time.

## 2022-06-06 NOTE — BHH Group Notes (Signed)
Spiritual care group on grief and loss facilitated by Chaplain Janne Napoleon, B[facility] and Lysle Morales, counseling intern.  Group Goal: Support / Education around grief and loss  Members engage in facilitated group support and psycho-social education.  Group Description:  Following introductions and group rules, group members engaged in facilitated group dialogue and support around topic of loss, with particular support around experiences of loss in their lives. Group Identified types of loss (relationships / self / things) and identified patterns, circumstances, and changes that precipitate losses. Reflected on thoughts / feelings around loss, normalized grief responses, and recognized variety in grief experience. Group encouraged individual reflection on safe space and on the coping skills that they are already utilizing.  Group drew on Adlerian / Rogerian and narrative framework  Patient Progress: Did not attend.

## 2022-06-06 NOTE — ED Notes (Signed)
Report given to [facility], Paramedic.

## 2022-06-06 NOTE — Plan of Care (Signed)
Nurse discussed anxiety, depression and coping skills with patient.  

## 2022-06-06 NOTE — Tx Team (Signed)
Initial Treatment Plan 06/06/2022 7:52 PM [name] [id number]    PATIENT STRESSORS: Financial difficulties   Medication change or noncompliance   Traumatic event     PATIENT STRENGTHS: Average or above average intelligence  Capable of independent living  Communication skills  General fund of knowledge  Motivation for treatment/growth  Physical Health    PATIENT IDENTIFIED PROBLEMS: "Suicidal thoughts"  "Thoughts to hurt others"  "Anxiety"  "Depression"               DISCHARGE CRITERIA:  Ability to meet basic life and health needs Improved stabilization in mood, thinking, and/or behavior Motivation to continue treatment in a less acute level of care Need for constant or close observation no longer present Reduction of life-threatening or endangering symptoms to within safe limits Safe-care adequate arrangements made Verbal commitment to aftercare and medication compliance Withdrawal symptoms are absent or subacute and managed without 24-hour nursing intervention  PRELIMINARY DISCHARGE PLAN: Attend aftercare/continuing care group Attend PHP/IOP Attend 12-step recovery group Outpatient therapy Placement in alternative living arrangements  PATIENT/FAMILY INVOLVEMENT: This treatment plan has been presented to and reviewed with the patient, Theodore Carr.  The patient and family have been given the opportunity to ask questions and make suggestions.  Grayland Ormond [place], [place] 06/06/2022, 7:52 PM
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
## 2022-06-06 NOTE — ED Notes (Signed)
Pt admitted to [facility] OBS unit while awaiting placement at [facility]. Pt has been accepted there pending labwork and covid. Pt is cooperative with admission and states '' I just feel like I can't give up but so frustrated. They took me off my medications at that treatment facility, told me I couldn't be on the seroquel or the gabapentin. And so I did it because I want to stay clean and stay out of trouble. But then these thoughts are bothering me so now I told them about it and now they are kicking me out. ''  Allowed pt to ventilate and support given. Pt oriented to unit, signed vol consent and faxed to bhh. Cooperative with blood draw and EKG, UDS.  Labs sent.  Pt given sandwich and drink and granola bar. Pt reports he wants to ensure his items and belongings are sent to [facility] from previous treatment facility. Compliant with medications. Pt denies any SI /HI at this time but reports continued intrusive thoughts of hurting others that he '' does not want to act on '' and appear to be distressing for the patient. Pt is safe, no evidence of responding to internal stimuli. No skin issues noted. No contraband found.
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
## 2022-06-06 NOTE — Progress Notes (Signed)
Pt was accepted to [facility] 06/06/2022, pending labs, covid PCR, EKG, and signed voluntary consent faxed to [phone]. Bed assignment: [id number]  Pt meets inpatient criteria per Thomes Lolling, NP  Attending Physician will be Janine Limbo, MD  Report can be called to: - Adult unit: [phone]  Pt can arrive after pending items are received; [facility] to coordinate arrival time  Care Team Notified: [facility] Lynnda Shields, RN, Thomes Lolling, NP, Will Hilma Favors, RN, and Leonia Reader, RN  [place], [place]  06/06/2022 11:56 AM

## 2022-06-07 ENCOUNTER — Encounter (HOSPITAL_COMMUNITY): Payer: Self-pay | Admitting: Psychiatry

## 2022-06-07 DIAGNOSIS — F332 Major depressive disorder, recurrent severe without psychotic features: Principal | ICD-10-CM

## 2022-06-07 DIAGNOSIS — F1320 Sedative, hypnotic or anxiolytic dependence, uncomplicated: Secondary | ICD-10-CM | POA: Diagnosis present

## 2022-06-07 LAB — RPR: RPR Ser Ql: NONREACTIVE

## 2022-06-07 LAB — GC/CHLAMYDIA PROBE AMP (~~LOC~~) NOT AT ARMC
Chlamydia: NEGATIVE
Comment: NEGATIVE
Comment: NORMAL
Neisseria Gonorrhea: NEGATIVE

## 2022-06-07 MED ORDER — HYDROXYZINE HCL 50 MG PO TABS
50.0000 mg | ORAL_TABLET | Freq: Three times a day (TID) | ORAL | Status: DC | PRN
  Administered 2022-06-07 – 2022-06-10 (×9): 50 mg via ORAL
  Filled 2022-06-07 (×9): qty 1

## 2022-06-07 MED ORDER — GABAPENTIN 300 MG PO CAPS
600.0000 mg | ORAL_CAPSULE | Freq: Three times a day (TID) | ORAL | Status: DC
  Administered 2022-06-07 – 2022-06-08 (×3): 600 mg via ORAL
  Filled 2022-06-07 (×6): qty 2

## 2022-06-07 NOTE — Progress Notes (Signed)
Adult Psychoeducational Group Note  Date:  06/07/2022 Time:  9:32 PM  Group Topic/Focus:  Wrap-Up Group:   The focus of this group is to help patients review their daily goal of treatment and discuss progress on daily workbooks.  Participation Level:  Did Not Attend  Participation Quality:   did not attend  Affect:   did not attend  Cognitive:   did not attend  Insight: None  Engagement in Group:   did not attend  Modes of Intervention:   did not attend  Additional Comments:  did not attend  Debe Coder 06/07/2022, 9:32 PM

## 2022-06-07 NOTE — Progress Notes (Signed)
D:  Patient's self inventory sheet, patient sleeps good, sleep medication helpful.  Good appetite, normal energy level, good concentration.  Rated depression 8.  Hopeless 8.  Anxiety 8.  Denied withdrawals.  SI, sometimes.  Physical problems, pain, T-12 compression fracture.  Pain medicine for back.  Goal is evicting devil from his mind.  Need help feeling good in me.  All of above is for staff.  Death over streets. A:  Medications administered per MD orders.  Emotional support and encouragement given patient. R:  Denied SI and HI, contracts for safety.  Denied  A/V hallucinations.  Safety maintained with 15 minute checks.

## 2022-06-07 NOTE — BHH Counselor (Signed)
BHH/BMU LCSW Progress Note   06/07/2022    3:57 PM  [name]      Type of Note: BATS application / Transitional Housing   After completing patient assessment he was provided a list of resources to start back his search in getting accepted into another residential. Patient was upfront with CSW about places that were not an option for him such as [facility], [facility] [place], [facility], and [facility] due to his criminal background record or behavior. However, patient mentioned that his next two options were [facility] [place] or [facility]. CSW did explain that transportation would need to be initiated by him since facility cannot do [place] nor [place] ( but CSW did mention that if he felt comfortable a PART bus pass could be provided to [facility]). Patient understood and completed his BATS program application.      Signed:   Silas Flood, MSW, [facility] 06/07/2022 3:57 PM
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
## 2022-06-07 NOTE — Group Note (Signed)
Recreation Therapy Group Note   Group Topic:Animal Assisted Therapy   Group Date: 06/07/2022 Start Time: 1430 End Time: 1500 Facilitators: Louann Hopson-McCall, LRT,CTRS Location: 300 Hall Dayroom   Animal-Assisted Activity (AAA) Program Checklist/Progress Notes Patient Eligibility Criteria Checklist & Daily Group note for Rec Tx Intervention  AAA/T Program Assumption of Risk Form signed by Patient/ or Parent Legal Guardian Yes  Patient is free of allergies or severe asthma Yes  Patient reports no fear of animals Yes  Patient reports no history of cruelty to animals Yes  Patient understands his/her participation is voluntary Yes  Patient washes hands before animal contact Yes  Patient washes hands after animal contact Yes   Affect/Mood: Appropriate   Participation Level: Engaged   Participation Quality: Independent   Behavior: Appropriate    Clinical Observations/Individualized Feedback: Patient attended session and interacted appropriately with therapy dog and peers. Patient asked appropriate questions about therapy dog and his training. Patient shared stories about their pets at home with group.     Plan: Continue to engage patient in RT group sessions 2-3x/week.   Apryle Stowell-McCall, LRT,CTRS 06/07/2022 3:55 PM

## 2022-06-07 NOTE — BHH Group Notes (Signed)
Adult Psychoeducational Group Note  Date:  06/07/2022 Time:  10:41 AM  Group Topic/Focus:  Goals Group:   The focus of this group is to help patients establish daily goals to achieve during treatment and discuss how the patient can incorporate goal setting into their daily lives to aide in recovery.  Participation Level:  Did Not Attend  Participation Quality:   Did not attend  Affect:   Did not attend  Cognitive:   Did not attend  Insight: None  Engagement in Group:  Did not attend  Modes of Intervention:   Did not attend  Additional Comments:  Pt did not attend goal group due to being in bed.  Laprecious Austill, Georgiann Mccoy 06/07/2022, 10:41 AM

## 2022-06-07 NOTE — Plan of Care (Signed)
Nurse discussed anxiety, depression and coping skills with patient.  

## 2022-06-07 NOTE — Progress Notes (Addendum)
Patient wants to know why neurotin has not been changed to three times a day. If MD does not come to me, I am going back there and get them.
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
## 2022-06-07 NOTE — Progress Notes (Signed)
Patient came back to med window, stated:  I'm going on a hunger strike.  Or I will hurt myself.  If I don't get my gabapentin three times a day.  Breakfast, lunch, dinner.  That is how I am supposed to get my medicine.  I was taking this medicine five years like this.  That's it.  Patient stated tell them to have a blest day.

## 2022-06-07 NOTE — Progress Notes (Signed)
Patient wants his tylenol to be changed to ibuprofen for pain.   Needs to have cholesterol checked, supposed to be on Tricor. Neurotin needs to be tid.
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
## 2022-06-07 NOTE — Group Note (Signed)
LCSW Group Therapy Note  Group Date: 06/07/2022 Start Time: 1100 End Time: 1200   Type of Therapy and Topic:  Group Therapy - How To Cope with Nervousness about Discharge   Participation Level:  Did Not Attend   Description of Group This process group involved identification of patients' feelings about discharge. Some of them are scheduled to be discharged soon, while others are new admissions, but each of them was asked to share thoughts and feelings surrounding discharge from the hospital. One common theme was that they are excited at the prospect of going home, while another was that many of them are apprehensive about sharing why they were hospitalized. Patients were given the opportunity to discuss these feelings with their peers in preparation for discharge.  Therapeutic Goals  Patient will identify their overall feelings about pending discharge. Patient will think about how they might proactively address issues that they believe will once again arise once they get home (i.e. with parents). Patients will participate in discussion about having hope for change.     Therapeutic Modalities Cognitive Behavioral Therapy   Windle Guard, LCSW 06/07/2022  3:08 PM

## 2022-06-08 ENCOUNTER — Encounter (HOSPITAL_COMMUNITY): Payer: Self-pay

## 2022-06-08 MED ORDER — QUETIAPINE FUMARATE 100 MG PO TABS
100.0000 mg | ORAL_TABLET | Freq: Every day | ORAL | Status: DC
  Administered 2022-06-08: 100 mg via ORAL
  Filled 2022-06-08 (×3): qty 1

## 2022-06-08 MED ORDER — QUETIAPINE FUMARATE 25 MG PO TABS
25.0000 mg | ORAL_TABLET | Freq: Every morning | ORAL | Status: DC
  Administered 2022-06-09: 25 mg via ORAL
  Filled 2022-06-08 (×2): qty 1

## 2022-06-08 MED ORDER — IBUPROFEN 200 MG PO TABS
200.0000 mg | ORAL_TABLET | Freq: Four times a day (QID) | ORAL | Status: DC | PRN
  Administered 2022-06-08 – 2022-06-09 (×3): 200 mg via ORAL
  Filled 2022-06-08 (×3): qty 1

## 2022-06-08 MED ORDER — QUETIAPINE FUMARATE 25 MG PO TABS
25.0000 mg | ORAL_TABLET | Freq: Every day | ORAL | Status: DC
  Administered 2022-06-08: 25 mg via ORAL
  Filled 2022-06-08 (×3): qty 1

## 2022-06-08 MED ORDER — GABAPENTIN 300 MG PO CAPS
600.0000 mg | ORAL_CAPSULE | Freq: Three times a day (TID) | ORAL | Status: DC
  Administered 2022-06-08 – 2022-06-13 (×15): 600 mg via ORAL
  Filled 2022-06-08 (×21): qty 2

## 2022-06-08 MED ORDER — ZIPRASIDONE MESYLATE 20 MG IM SOLR: 20.0000 mg | INTRAMUSCULAR | Status: DC | PRN

## 2022-06-08 MED ORDER — OLANZAPINE 10 MG PO TBDP
10.0000 mg | ORAL_TABLET | Freq: Three times a day (TID) | ORAL | Status: DC | PRN
  Administered 2022-06-09 – 2022-06-15 (×8): 10 mg via ORAL
  Filled 2022-06-08 (×8): qty 1

## 2022-06-08 NOTE — BH IP Treatment Plan (Signed)
Interdisciplinary Treatment and Diagnostic Plan Update  06/08/2022 Time of Session: 9:05am  Theodore Carr MRN: [id number]  Principal Diagnosis: MDD (major depressive disorder), recurrent severe, without psychosis ([place])  Secondary Diagnoses: Principal Problem:   MDD (major depressive disorder), recurrent severe, without psychosis ([place]) Active Problems:   MDD (major depressive disorder), recurrent episode, severe ([place])   Severe benzodiazepine use disorder ([place])   Current Medications:  Current Facility-Administered Medications  Medication Dose Route Frequency Provider Last Rate Last Admin   alum & mag hydroxide-simeth (MAALOX/MYLANTA) 200-200-20 MG/5ML suspension 30 mL  30 mL Oral Q4H PRN Revonda Humphrey, NP       buPROPion (WELLBUTRIN XL) 24 hr tablet 150 mg  150 mg Oral Daily Revonda Humphrey, NP   150 mg at 06/08/22 0758   gabapentin (NEURONTIN) capsule 600 mg  600 mg Oral TID Merrily Brittle, DO   600 mg at 06/08/22 1359   hydrOXYzine (ATARAX) tablet 50 mg  50 mg Oral TID PRN Merrily Brittle, DO   50 mg at 06/08/22 1137   ibuprofen (ADVIL) tablet 200 mg  200 mg Oral Q6H PRN Merrily Brittle, DO   200 mg at 06/08/22 1137   magnesium hydroxide (MILK OF MAGNESIA) suspension 30 mL  30 mL Oral Daily PRN Revonda Humphrey, NP       nicotine (NICODERM CQ - dosed in mg/24 hours) patch 14 mg  14 mg Transdermal Daily Massengill, Nathan, MD       OLANZapine zydis (ZYPREXA) disintegrating tablet 10 mg  10 mg Oral Q8H PRN Merrily Brittle, DO       And   ziprasidone (GEODON) injection 20 mg  20 mg Intramuscular PRN Merrily Brittle, DO       [START ON 06/09/2022] QUEtiapine (SEROQUEL) tablet 25 mg  25 mg Oral q AM Merrily Brittle, DO       And   QUEtiapine (SEROQUEL) tablet 25 mg  25 mg Oral Q1200 Merrily Brittle, DO   25 mg at 06/08/22 1138   And   QUEtiapine (SEROQUEL) tablet 100 mg  100 mg Oral QHS Merrily Brittle, DO       traZODone (DESYREL) tablet 50 mg  50 mg Oral QHS PRN Revonda Humphrey, NP   50 mg  at 06/07/22 2118   PTA Medications: Medications Prior to Admission  Medication Sig Dispense Refill Last Dose   buPROPion (WELLBUTRIN XL) 150 MG 24 hr tablet Take 1 tablet (150 mg total) by mouth daily. 30 tablet 0    buPROPion (WELLBUTRIN XL) 150 MG 24 hr tablet Take 1 tablet (150 mg total) by mouth daily.      gabapentin (NEURONTIN) 300 MG capsule Take 2 capsules (600 mg total) by mouth 2 (two) times daily.      gabapentin (NEURONTIN) 600 MG tablet Take 1 tablet (600 mg total) by mouth 2 (two) times daily. 60 tablet 0     Patient Stressors: Financial difficulties   Medication change or noncompliance   Traumatic event    Patient Strengths: Average or above average intelligence  Capable of independent living  Communication skills  General fund of knowledge  Motivation for treatment/growth  Physical Health   Treatment Modalities: Medication Management, Group therapy, Case management,  1 to 1 session with clinician, Psychoeducation, Recreational therapy.   Physician Treatment Plan for Primary Diagnosis: MDD (major depressive disorder), recurrent severe, without psychosis ([place]) Long Term Goal(s):     Short Term Goals:    Medication Management: Evaluate patient's response, side effects, and  tolerance of medication regimen.  Therapeutic Interventions: 1 to 1 sessions, Unit Group sessions and Medication administration.  Evaluation of Outcomes: Not Met  Physician Treatment Plan for Secondary Diagnosis: Principal Problem:   MDD (major depressive disorder), recurrent severe, without psychosis ([place]) Active Problems:   MDD (major depressive disorder), recurrent episode, severe ([place])   Severe benzodiazepine use disorder ([place])  Long Term Goal(s):     Short Term Goals:       Medication Management: Evaluate patient's response, side effects, and tolerance of medication regimen.  Therapeutic Interventions: 1 to 1 sessions, Unit Group sessions and Medication  administration.  Evaluation of Outcomes: Not Met   RN Treatment Plan for Primary Diagnosis: MDD (major depressive disorder), recurrent severe, without psychosis ([place]) Long Term Goal(s): Knowledge of disease and therapeutic regimen to maintain health will improve  Short Term Goals: Ability to remain free from injury will improve, Ability to participate in decision making will improve, Ability to verbalize feelings will improve, Ability to disclose and discuss suicidal ideas, and Ability to identify and develop effective coping behaviors will improve  Medication Management: RN will administer medications as ordered by provider, will assess and evaluate patient's response and provide education to patient for prescribed medication. RN will report any adverse and/or side effects to prescribing provider.  Therapeutic Interventions: 1 on 1 counseling sessions, Psychoeducation, Medication administration, Evaluate responses to treatment, Monitor vital signs and CBGs as ordered, Perform/monitor CIWA, COWS, AIMS and Fall Risk screenings as ordered, Perform wound care treatments as ordered.  Evaluation of Outcomes: Not Met   LCSW Treatment Plan for Primary Diagnosis: MDD (major depressive disorder), recurrent severe, without psychosis ([place]) Long Term Goal(s): Safe transition to appropriate next level of care at discharge, Engage patient in therapeutic group addressing interpersonal concerns.  Short Term Goals: Engage patient in aftercare planning with referrals and resources, Increase social support, Increase emotional regulation, Facilitate acceptance of mental health diagnosis and concerns, Identify triggers associated with mental health/substance abuse issues, and Increase skills for wellness and recovery  Therapeutic Interventions: Assess for all discharge needs, 1 to 1 time with Social worker, Explore available resources and support systems, Assess for adequacy in community support network, Educate  family and significant other(s) on suicide prevention, Complete Psychosocial Assessment, Interpersonal group therapy.  Evaluation of Outcomes: Not Met   Progress in Treatment: Attending groups: Yes. Participating in groups: Yes. Taking medication as prescribed: Yes. Toleration medication: Yes. Family/Significant other contact made: Yes, individual(s) contacted:  Research officer, trade union  Patient understands diagnosis: Yes. Discussing patient identified problems/goals with staff: Yes. Medical problems stabilized or resolved: Yes. Denies suicidal/homicidal ideation: Yes. Issues/concerns per patient self-inventory: No.   New problem(s) identified: No, Describe:  None   New Short Term/Long Term Goal(s): medication stabilization, elimination of SI thoughts, development of comprehensive mental wellness plan.   Patient Goals: "Develop a better mindset"  Discharge Plan or Barriers: Patient recently admitted. CSW will continue to follow and assess for appropriate referrals and possible discharge planning.   Reason for Continuation of Hospitalization: Anxiety Depression Homicidal ideation Medication stabilization Suicidal ideation  Estimated Length of Stay: 3 to 7 days   Last [place] Suicide Severity Risk Score: [place] Admission (Current) from 06/06/2022 in [facility] Most recent reading at 06/06/2022  6:41 PM ED from 06/06/2022 in [facility] Most recent reading at 06/06/2022 12:24 PM Admission (Discharged) from 04/20/2022 in [facility] Most recent reading at 04/20/2022 10:28 PM  C-SSRS  RISK CATEGORY High Risk Low Risk High Risk       Last PHQ 2/9 Scores:    04/02/2022    6:33 PM 02/11/2015   10:00 AM  Depression screen PHQ 2/9  Decreased Interest 1 0  Down, Depressed, Hopeless 2 1  PHQ - 2 Score 3 1  Altered sleeping 1   Tired, decreased energy 1   Change in appetite 1   Feeling  bad or failure about yourself  2   Trouble concentrating 1   Moving slowly or fidgety/restless 0   Suicidal thoughts 2   PHQ-9 Score 11   Difficult doing work/chores Very difficult     Scribe for Treatment Team: Darleen Crocker, Latanya Presser 06/08/2022 3:01 PM

## 2022-06-08 NOTE — BHH Counselor (Signed)
BHH/BMU LCSW Progress Note   06/08/2022    1:30 PM  [name]      Type of Note: BATS referral   CSW faxed patient BATS application along with H&P, progress note from doctor, medication list, and facesheet.     Signed:   Silas Flood, MSW, LCSWA 06/08/2022 1:30 PM

## 2022-06-08 NOTE — Progress Notes (Signed)
Pt medication compliant and attending groups. Pt reports be awoken overnight from someone calling his name but states staff told him no one was calling his name. Pt endorses anxiety and back pain. Pt reports working on anger as a goal. Pt does continue to endorse passive hi. Denies plan or intent. Q 15 minute checks ongoing.

## 2022-06-08 NOTE — Progress Notes (Signed)
Psychoeducational Group Note  Date:  06/08/2022 Time:  2130  Group Topic/Focus:  Relapse Prevention Planning:   The focus of this group is to define relapse and discuss the need for planning to combat relapse.  Participation Level: Did Not Attend  Participation Quality:  Not Applicable  Affect:  Not Applicable  Cognitive:  Not Applicable  Insight:  Not Applicable  Engagement in Group: Not Applicable  Additional Comments:  The patient did not attend the evening group.   Nicolae Vasek S 06/08/2022, 9:30 PM

## 2022-06-08 NOTE — BHH Counselor (Addendum)
BHH/BMU LCSW Progress Note   06/08/2022    2:23 PM  Theodore Carr      Type of Note: Visit with Patient / [facility] Administrator, arts ) In [place] went to see patient per request regarding questions that he had about his options , CSW reviewed what was sent off such as BATS application and Referral to [facility] residential in [place]. Patient then said that he has been calling [facility] and needed to know his estimated DC [name] possibly next week but it can change. After going over the options patient kept asking, " if I go to any of the places will I be able to manage my own medications and take them when I needed to? " CSW informed  patient that he would need to take them based on how they are prescribe, time and dosage, and speak with doctor if he has any questions about anything but to ask the programs what the process is once he goes there.    CSW spoke with [name], the coordinator over this program . [name] stated that he has spoken with patient about criteria and said that he does not reserve any beds, patient would have to call everyday to see if a bed is available, right now ,he has 2 open, but can change. However, [name] continued with the programs criteria, patient can be on parole, would need a 30-day supply of medication , does not need to be detoxing because they are not a detox program, no cell phones for the first few months, and intake is Monday-Friday from 7AM-1:00 PM.    Signed:   Silas Flood, MSW, LCSWA 06/08/2022 2:23 PM

## 2022-06-08 NOTE — BHH Suicide Risk Assessment (Signed)
[place] INPATIENT:  Family/Significant Other Suicide Prevention Education  Suicide Prevention Education:  Education Completed; Erskine C [phone] or Jenetta [phone],  (name of family member/significant other) has been identified by the patient as the family member/significant other with whom the patient will be residing, and identified as the person(s) who will aid the patient in the event of a mental health crisis (suicidal ideations/suicide attempt).  With written consent from the patient, the family member/significant other has been provided the following suicide prevention education, prior to the and/or following the discharge of the patient.  The suicide prevention education provided includes the following: Suicide risk factors Suicide prevention and interventions National Suicide Hotline telephone number [facility] assessment telephone number [facility] Emergency Assistance [place] and/or Residential Mobile Crisis Unit telephone number  Request made of family/significant other to: Remove weapons (e.g., guns, rifles, knives), all items previously/currently identified as safety concern.   Remove drugs/medications (over-the-counter, prescriptions, illicit drugs), all items previously/currently identified as a safety concern.  The family member/significant other verbalizes understanding of the suicide prevention education information provided.  The family member/significant other agrees to remove the items of safety concern listed above.  CSW spoke with [employer] Ms. Rowe Robert to completed safety planning. Ms. Rowe Robert stated that patient was doing really well, passed his drug screen and all of a sudden just started having violent thoughts against one of the workers at his last residential program. CSW did inform Ms.Rowe Robert , patient A,B, and C options of other residential programs and confirmed that patient could leave the county. CSW did inform Ms.  Rowe Robert the names and locations, and asked if she would be able to assist  with transportation and take patient to the [facility] residential in [place], [place] and she said she will check with her supervisor to get it approved but should not be a problem. Ms. Rowe Robert had no further questions or concerns and stated that patient did not have any access to guns or weapons.   Sherre Lain 06/08/2022, 4:38 PM

## 2022-06-08 NOTE — Progress Notes (Signed)
   06/07/22 2200  Psych Admission Type (Psych Patients Only)  Admission Status Voluntary  Psychosocial Assessment  Patient Complaints Anxiety  Eye Contact Fair  Facial Expression Animated  Affect Appropriate to circumstance  Speech Logical/coherent  Interaction Assertive  Motor Activity Slow  Appearance/Hygiene Unremarkable  Behavior Characteristics Appropriate to situation  Mood Anxious  Thought Process  Coherency WDL  Content WDL  Delusions None reported or observed  Perception WDL  Hallucination None reported or observed  Judgment Poor  Confusion None  Danger to Self  Current suicidal ideation? Denies  Self-Injurious Behavior No self-injurious ideation or behavior indicators observed or expressed   Agreement Not to Harm Self Yes  Description of Agreement verbal  Danger to Others  Danger to Others None reported or observed  Danger to Others Abnormal  Harmful Behavior to others No threats or harm toward other people  Destructive Behavior No threats or harm toward property

## 2022-06-08 NOTE — Progress Notes (Signed)
[facility] MD Progress Note  Date: 06/08/2022 11:11 AM Name: Theodore Carr  DOB: 18-May-1985  MRN:  [id number] Unit: [id number]/[id number]  CC: active HI/SI  Raybon Dedominicis is a 37 y.o. 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  At [facility] 1/3-03/2023  Last 24h: Required agitation PRN Documented sleep last 24 hours: 7.75   Subjective:  On assessment today, patient was initially seen asleep in the room, awoken easily, no acute distress, steady gait. Patient was pleasant, calm, cooperative during evaluation. Continued to endorse passive HI towards staff members at residential that he came from, denied intention.  Reported that he has thoughts to go into thirds when confronting him, denied wanting to harm the person.  Continued to endorse hypervigilance/hyperarousal from past trauma in jail.  Reported that yesterday when he became upset, he was able to stop and think about his grandmother, son antibiotic and situation.  Showering, himself down.  Reported that he talked to his grandmother, son and brother over the phone yesterday, reported good conversation.  He feels that day [facility] recovery would be the best place for him to go after discharge.  Requested that his gabapentin evening time be changed per below.  Requested ibuprofen rather than Tylenol, as he feels more effective.  Amenable to starting Seroquel, per below.  Patient side effects to current scheduled psychiatric medications: Denied  Mood: Depressed Sleep:Fair Appetite: Good  Suicidal Thoughts: No-last SI was passive on 2/20 Homicidal Thoughts:  Yes, Passive HI Passive Intent and/or Plan: Without Intent Hallucinations: None  Review of Systems  Respiratory:  Negative for shortness of breath.   Cardiovascular:  Negative for chest pain and palpitations.  Gastrointestinal:  Negative for abdominal pain, constipation, diarrhea, nausea and vomiting.  Neurological:  Negative for dizziness and headaches.     Principal Problem: MDD (major depressive disorder), recurrent severe, without psychosis ([place]) Diagnosis: Principal Problem:   MDD (major depressive disorder), recurrent severe, without psychosis ([place]) Active Problems:   MDD (major depressive disorder), recurrent episode, severe ([place])   Severe benzodiazepine use disorder ([place])   Past Psychiatric History: See H&P Past Medical History:  Past Medical History:  Diagnosis Date   Compression fracture of T12 vertebra ([place]) 2008   Depression    Hypertension    Insomnia 04/21/2022    History reviewed. No pertinent surgical history. Family History:  Family History  Problem Relation Age of Onset   Drug abuse Cousin    Family Psychiatric History: See H&P Social History:  Social History   Substance and Sexual Activity  Alcohol Use Not Currently   Alcohol/week: 2.0 standard drinks of alcohol   Types: 2 Cans of beer per week   Comment: last used 4 days ago in prison     Social History   Substance and Sexual Activity  Drug Use Not Currently   Frequency: 2.0 times per week   Types: Cocaine   Comment: last used 4 days ago    Social History   Socioeconomic History   Marital status: Single    Spouse name: Not on file   Number of children: Not on file   Years of education: 81  Highest education level: GED or equivalent  Occupational History   Not on file  Tobacco Use   Smoking status: Former    Packs/day: 1.00    Years: 15.00    Total pack years: 15.00    Types: Cigarettes    Quit date: 03/18/2022    Years since quitting: 0.2   Smokeless tobacco: Former   Tobacco  comments:    Refused nicotine patch and nicotine gum  Vaping Use   Vaping Use: Never used  Substance and Sexual Activity   Alcohol use: Not Currently    Alcohol/week: 2.0 standard drinks of alcohol    Types: 2 Cans of beer per week    Comment: last used 4 days ago in prison   Drug use: Not Currently    Frequency: 2.0 times per week    Types: Cocaine    Comment: last used 4 days ago   Sexual activity: Yes  Other Topics Concern   Not on file  Social History Narrative   Not on file   Social Determinants of Health   Financial Resource Strain: Not on file  Food Insecurity: No Food Insecurity (06/06/2022)   Hunger Vital Sign    Worried About Running Out of Food in the Last Year: Never true    Ran Out of Food in the Last Year: Never true  Transportation Needs: Unknown (06/06/2022)   [place] - Hydrologist (Medical): Patient refused    Lack of Transportation (Non-Medical): Patient refused  Physical Activity: Not on file  Stress: Not on file  Social Connections: Not on file   Additional Social History:                         Current Medications: Current Facility-Administered Medications  Medication Dose Route Frequency Provider Last Rate Last Admin   acetaminophen (TYLENOL) tablet 650 mg  650 mg Oral Q6H PRN Revonda Humphrey, NP   650 mg at 06/08/22 0638   alum & mag hydroxide-simeth (MAALOX/MYLANTA) 200-200-20 MG/5ML suspension 30 mL  30 mL Oral Q4H PRN Revonda Humphrey, NP       buPROPion (WELLBUTRIN XL) 24 hr tablet 150 mg  150 mg Oral Daily Revonda Humphrey, NP   150 mg at 06/08/22 0758   gabapentin (NEURONTIN) capsule 600 mg  600 mg Oral TID Merrily Brittle, DO       hydrOXYzine (ATARAX) tablet 50 mg  50 mg Oral TID PRN Merrily Brittle, DO   50 mg at 06/08/22 [id number]   magnesium hydroxide (MILK OF MAGNESIA) suspension 30 mL  30 mL Oral Daily PRN Revonda Humphrey, NP       nicotine (NICODERM CQ - dosed in mg/24 hours) patch 14 mg  14 mg  Transdermal Daily Massengill, Nathan, MD       OLANZapine zydis (ZYPREXA) disintegrating tablet 5 mg  5 mg Oral Q8H PRN Revonda Humphrey, NP   5 mg at 06/07/22 1411   And   ziprasidone (GEODON) injection 20 mg  20 mg Intramuscular PRN Revonda Humphrey, NP       [START ON 06/09/2022] QUEtiapine (SEROQUEL) tablet 25 mg  25 mg Oral q AM Merrily Brittle, DO       And   QUEtiapine (SEROQUEL) tablet 25 mg  25 mg Oral Q1200 Merrily Brittle, DO       And   QUEtiapine (SEROQUEL) tablet 100 mg  100 mg Oral QHS Merrily Brittle, DO  traZODone (DESYREL) tablet 50 mg  50 mg Oral QHS PRN Revonda Humphrey, NP   50 mg at 06/07/22 2118    Lab Results:  Results for orders placed or performed during the hospital encounter of 06/06/22 (from the past 48 hour(s))  Resp panel by RT-PCR (RSV, Flu A&B, Covid) Anterior Nasal Swab     Status: None   Collection Time: 06/06/22 11:44 AM   Specimen: Anterior Nasal Swab  Result Value Ref Range   SARS Coronavirus 2 by RT PCR NEGATIVE NEGATIVE   Influenza A by PCR NEGATIVE NEGATIVE   Influenza B by PCR NEGATIVE NEGATIVE    Comment: (NOTE) The Xpert Xpress SARS-CoV-2/FLU/RSV plus assay is intended as an aid in the diagnosis of influenza from Nasopharyngeal swab specimens and should not be used as a sole basis for treatment. Nasal washings and aspirates are unacceptable for Xpert Xpress SARS-CoV-2/FLU/RSV testing.  Fact Sheet for Patients: EntrepreneurPulse.com.au  Fact Sheet for Healthcare Providers: IncredibleEmployment.be  This test is not yet approved or cleared by the [place] FDA and has been authorized for detection and/or diagnosis of SARS-CoV-2 by FDA under an Emergency Use Authorization (EUA). This EUA will remain in effect (meaning this test can be used) for the duration of the COVID-19 declaration under Section 564(b)(1) of the Act, 21 U.S.C. section 360bbb-3(b)(1), unless the authorization is terminated  or revoked.     Resp Syncytial Virus by PCR NEGATIVE NEGATIVE    Comment: (NOTE) Fact Sheet for Patients: EntrepreneurPulse.com.au  Fact Sheet for Healthcare Providers: IncredibleEmployment.be  This test is not yet approved or cleared by the [place] FDA and has been authorized for detection and/or diagnosis of SARS-CoV-2 by FDA under an Emergency Use Authorization (EUA). This EUA will remain in effect (meaning this test can be used) for the duration of the COVID-19 declaration under Section 564(b)(1) of the Act, 21 U.S.C. section 360bbb-3(b)(1), unless the authorization is terminated or revoked.  Performed at [facility], [place] [address] [place], [place] [address]   GC/Chlamydia probe amp ([place])not at [facility]     Status: None   Collection Time: 06/06/22 11:44 AM  Result Value Ref Range   Neisseria Gonorrhea Negative    Chlamydia Negative    Comment Normal Reference Ranger Chlamydia - Negative    Comment      Normal Reference Range Neisseria Gonorrhea - Negative  Urinalysis, Complete w Microscopic -Urine, Clean Catch     Status: Abnormal   Collection Time: 06/06/22 11:44 AM  Result Value Ref Range   Color, Urine YELLOW YELLOW   APPearance CLEAR CLEAR   Specific Gravity, Urine <1.005 (L) 1.005 - 1.030   pH 6.0 5.0 - 8.0   Glucose, UA NEGATIVE NEGATIVE mg/dL   Hgb urine dipstick NEGATIVE NEGATIVE   Bilirubin Urine NEGATIVE NEGATIVE   Ketones, ur NEGATIVE NEGATIVE mg/dL   Protein, ur NEGATIVE NEGATIVE mg/dL   Nitrite NEGATIVE NEGATIVE   Leukocytes,Ua NEGATIVE NEGATIVE   Squamous Epithelial / HPF 0-5 0 - 5 /HPF   WBC, UA 0-5 0 - 5 WBC/hpf   RBC / HPF 0-5 0 - 5 RBC/hpf   Bacteria, UA RARE (A) NONE SEEN   Mucus PRESENT     Comment: Performed at [facility], [address], [place], [place] [address]  CBC with Differential/Platelet     Status: None   Collection Time: 06/06/22 11:45 AM  Result Value Ref Range    WBC 8.4 4.0 - 10.5 K/uL  RBC 5.02 4.22 - 5.81 MIL/uL   Hemoglobin 15.3 13.0 - 17.0 g/dL   HCT 45.9 39.0 - 52.0 %   MCV 91.4 80.0 - 100.0 fL   MCH 30.5 26.0 - 34.0 pg   MCHC 33.3 30.0 - 36.0 g/dL   RDW 12.7 11.5 - 15.5 %   Platelets 326 150 - 400 K/uL   nRBC 0.0 0.0 - 0.2 %   Neutrophils Relative % 60 %   Neutro Abs 5.0 1.7 - 7.7 K/uL   Lymphocytes Relative 29 %   Lymphs Abs 2.4 0.7 - 4.0 K/uL   Monocytes Relative 7 %   Monocytes Absolute 0.6 0.1 - 1.0 K/uL   Eosinophils Relative 3 %   Eosinophils Absolute 0.3 0.0 - 0.5 K/uL   Basophils Relative 1 %   Basophils Absolute 0.1 0.0 - 0.1 K/uL   Immature Granulocytes 0 %   Abs Immature Granulocytes 0.02 0.00 - 0.07 K/uL    Comment: Performed at [facility] [address], [place], [place] [address]  Comprehensive metabolic panel     Status: None   Collection Time: 06/06/22 11:45 AM  Result Value Ref Range   Sodium 139 135 - 145 mmol/L   Potassium 3.7 3.5 - 5.1 mmol/L   Chloride 103 98 - 111 mmol/L   CO2 25 22 - 32 mmol/L   Glucose, Bld 86 70 - 99 mg/dL    Comment: Glucose reference range applies only to samples taken after fasting for at least 8 hours.   BUN 11 6 - 20 mg/dL   Creatinine, Ser 1.07 0.61 - 1.24 mg/dL   Calcium 9.6 8.9 - 10.3 mg/dL   Total Protein 7.0 6.5 - 8.1 g/dL   Albumin 4.5 3.5 - 5.0 g/dL   AST 27 15 - 41 U/L   ALT 31 0 - 44 U/L   Alkaline Phosphatase 63 38 - 126 U/L   Total Bilirubin 0.7 0.3 - 1.2 mg/dL   GFR, Estimated >60 >60 mL/min    Comment: (NOTE) Calculated using the CKD-EPI Creatinine Equation (2021)    Anion gap 11 5 - 15    Comment: Performed at [facility] [address] [place], [place] [address]  Magnesium     Status: None   Collection Time: 06/06/22 11:45 AM  Result Value Ref Range   Magnesium 2.0 1.7 - 2.4 mg/dL    Comment: Performed at [facility] [address], [place], [place] [address]  Ethanol     Status: None   Collection Time: 06/06/22 11:45 AM   Result Value Ref Range   Alcohol, Ethyl (B) <10 <10 mg/dL    Comment: (NOTE) Lowest detectable limit for serum alcohol is 10 mg/dL.  For medical purposes only. Performed at [facility], [place] [address] [place], [place] [address]   RPR     Status: None   Collection Time: 06/06/22 11:45 AM  Result Value Ref Range   RPR Ser Ql NON REACTIVE NON REACTIVE    Comment: Performed at [facility], [place] [address] [name], [place] [address]  POCT Urine Drug Screen - (I-Screen)     Status: Normal   Collection Time: 06/06/22 11:49 AM  Result Value Ref Range   POC Amphetamine UR None Detected NONE DETECTED (Cut Off Level 1000 ng/mL)   POC Secobarbital (BAR) None Detected NONE DETECTED (Cut Off Level 300 ng/mL)   POC Buprenorphine (BUP) None Detected NONE DETECTED (Cut Off Level  10 ng/mL)   POC Oxazepam (BZO) None Detected NONE DETECTED (Cut Off Level 300 ng/mL)   POC Cocaine UR None Detected NONE DETECTED (Cut Off Level 300 ng/mL)   POC Methamphetamine UR None Detected NONE DETECTED (Cut Off Level 1000 ng/mL)   POC Morphine None Detected NONE DETECTED (Cut Off Level 300 ng/mL)   POC Methadone UR None Detected NONE DETECTED (Cut Off Level 300 ng/mL)   POC Oxycodone UR None Detected NONE DETECTED (Cut Off Level 100 ng/mL)   POC Marijuana UR None Detected NONE DETECTED (Cut Off Level 50 ng/mL)    Blood Alcohol level:  Lab Results  Component Value Date   ETH <10 06/06/2022   ETH <10 [id number]    Metabolic Disorder Labs: Lab Results  Component Value Date   HGBA1C 5.2 04/25/2022   MPG 103 04/25/2022   MPG 105 04/02/2022   Lab Results  Component Value Date   PROLACTIN 28.5 (H) 08/31/2016   Lab Results  Component Value Date   CHOL 201 (H) 04/02/2022   TRIG 207 (H) 04/02/2022   HDL 56 04/02/2022   CHOLHDL 3.6 04/02/2022   VLDL 41 (H) 04/02/2022   LDLCALC 104 (H) 04/02/2022   LDLCALC 76 08/31/2016    Physical Findings: BP 137/89 (BP Location: Left Arm)   Pulse (!)  104   Temp 97.8 F (36.6 C) (Oral)   Resp 18   Ht 5' 7"$  (1.702 m)   Wt 72.6 kg   SpO2 100%   BMI 25.06 kg/m   Physical Exam Physical Exam Vitals and nursing note reviewed.  Constitutional:      General: He is not in acute distress.    Appearance: He is not ill-appearing, toxic-appearing or diaphoretic.  HENT:     Head: Normocephalic.  Pulmonary:     Effort: Pulmonary effort is normal. No respiratory distress.  Neurological:     Mental Status: He is alert.     AIMS:   ,  ,  ,  ,       Psychiatric Specialty Exam: Presentation  General Appearance: Appropriate for Environment; Casual; Fairly Groomed  Eye Contact: Fair  Speech: Clear and Coherent; Normal Rate  Volume: Normal  Handedness:Right   Mood and Affect  Mood: Depressed  Affect: Appropriate; Congruent; Full Range   Thought Process  Thought Process: Coherent; Goal Directed  Descriptions of Associations: Circumstantial   Thought Content Suicidal Thoughts:No With Plan  Homicidal Thoughts:Yes, Passive Without Intent  Hallucinations:None Telling me to kill myself  Ideas of Reference:None  Thought Content:Perseveration; Rumination   Sensorium  Memory:Immediate Good  Judgment:Impaired  Insight:Shallow   Executive Functions  Orientation:Full (Time, Place and Person)  Language:Good  Concentration:Good  Short of Knowledge:Good   Psychomotor Activity  Psychomotor Activity:Normal   Assets  Assets:Communication Skills; Desire for Improvement; Resilience   Sleep  Quality:Fair  Documented sleep last 24 hours: 7.75   ASSESSMENT/PLAN: Diagnoses / Active Problems: Principal Problem:   MDD (major depressive disorder), recurrent severe, without psychosis ([place]) Active Problems:   MDD (major depressive disorder), recurrent episode, severe ([facility])   Severe benzodiazepine use disorder ([place])   Safety and Monitoring: Voluntary admission to inpatient  psychiatric unit for safety, stabilization and treatment   Daily contact with patient to assess and evaluate symptoms and progress in treatment Patient's case to be discussed in multi-disciplinary team meeting Observation Level: q15 minute checks  Vital signs: q12 hours Precautions: suicide, elopement, and assault  2. Psychiatric Diagnoses and Treatment:  MDD  v SIMD Continued wellbutrin 150 mg daily for depression Continued gabapentin 600 mg TID for mood lability - changed PM time to 6PM STARTED Seroquel 25 mg qAM + 25 mg qNoon + 100 mg qPM - mood lability    3. Medical Issues Being Addressed:  Mild Hypertriglyceridemia See below. No need for fibrate or omega 3 at this time per the REDUCE-IT criteria. Recommended diet management  4. Routine and other pertinent labs:   BMI:25.05  CMP WNL CBC WNL BAL <10 A1c 5.2 (04/25/2022)  Lipid Panel: Chol 201, triglyceride 207, VLDL 41, LDL 104, otherwise WNL (04/02/2022) TSH 1.700 (04/02/2022)   4. Discharge Planning:  Social work and case management to assist with discharge planning and identification of hospital follow-up needs prior to discharge Estimated LOS: 5-7 days Discharge Concerns: Need to establish a safety plan; Medication compliance and effectiveness Discharge Goals: Return home with outpatient referrals for mental health follow-up including medication management/psychotherapy   Treatment Plan Summary: Daily contact with patient to assess and evaluate symptoms and progress in treatment and Medication management  Total Time spent with patient: See attending attestation Patient's case was discussed with Attending Dian Situ, MD.   Signed: Merrily Brittle, DO Psychiatry Resident, PGY-2 [facility]  [address] [place], [place] [address] Ph: [phone] Fax: [phone] 06/08/2022, 11:11 AM

## 2022-06-08 NOTE — Group Note (Unsigned)
Date:  06/08/2022 Time:  11:09 AM  Group Topic/Focus:  Personal Choices and Values:   The focus of this group is to help patients assess and explore the importance of values in their lives, how their values affect their decisions, how they express their values and what opposes their expression.     Participation Level:  {BHH PARTICIPATION WO:[id number]  Participation Quality:  {BHH PARTICIPATION QUALITY:22265}  Affect:  {BHH AFFECT:22266}  Cognitive:  {BHH COGNITIVE:22267}  Insight: {BHH Insight2:20797}  Engagement in Group:  {BHH ENGAGEMENT IN BP:[id number]  Modes of Intervention:  {BHH MODES OF INTERVENTION:22269}  Additional Comments:  ***  Theodore Carr 06/08/2022, 11:09 AM

## 2022-06-09 MED ORDER — IBUPROFEN 600 MG PO TABS
600.0000 mg | ORAL_TABLET | Freq: Four times a day (QID) | ORAL | Status: DC | PRN
  Administered 2022-06-09 – 2022-06-16 (×16): 600 mg via ORAL
  Filled 2022-06-09 (×17): qty 1

## 2022-06-09 MED ORDER — QUETIAPINE FUMARATE 100 MG PO TABS
100.0000 mg | ORAL_TABLET | Freq: Every day | ORAL | Status: DC
  Administered 2022-06-09: 100 mg via ORAL
  Filled 2022-06-09 (×3): qty 1

## 2022-06-09 MED ORDER — IBUPROFEN 400 MG PO TABS: 400.0000 mg | ORAL_TABLET | Freq: Four times a day (QID) | ORAL | Status: DC | PRN

## 2022-06-09 MED ORDER — PANTOPRAZOLE SODIUM 20 MG PO TBEC
20.0000 mg | DELAYED_RELEASE_TABLET | ORAL | Status: DC
  Administered 2022-06-12 – 2022-06-13 (×2): 20 mg via ORAL
  Filled 2022-06-09 (×7): qty 1

## 2022-06-09 MED ORDER — QUETIAPINE FUMARATE 50 MG PO TABS
50.0000 mg | ORAL_TABLET | Freq: Every day | ORAL | Status: DC
  Filled 2022-06-09: qty 1

## 2022-06-09 MED ORDER — QUETIAPINE FUMARATE 50 MG PO TABS
50.0000 mg | ORAL_TABLET | Freq: Every morning | ORAL | Status: DC
  Administered 2022-06-09 – 2022-06-10 (×2): 50 mg via ORAL
  Filled 2022-06-09 (×2): qty 1

## 2022-06-09 NOTE — BHH Group Notes (Signed)
Pt attended wrap-up group.  Pt rated day 10.  He stated his goal for tomorrow is to not get worked up.

## 2022-06-09 NOTE — Progress Notes (Signed)
   06/08/22 1945  Psych Admission Type (Psych Patients Only)  Admission Status Voluntary  Psychosocial Assessment  Patient Complaints None  Eye Contact Fair  Facial Expression Flat  Affect Flat  Speech Logical/coherent  Interaction Assertive  Motor Activity Other (Comment) (WDL)  Appearance/Hygiene Unremarkable  Behavior Characteristics Appropriate to situation  Mood Pleasant  Thought Process  Coherency WDL  Content WDL  Delusions None reported or observed  Perception WDL  Hallucination None reported or observed  Judgment Poor  Confusion None  Danger to Self  Current suicidal ideation? Denies  Agreement Not to Harm Self Yes  Description of Agreement Verbal Contract  Danger to Others  Danger to Others None reported or observed  Danger to Others Abnormal  Harmful Behavior to others No threats or harm toward other people

## 2022-06-09 NOTE — Progress Notes (Signed)
[facility] MD Progress Note  Date: 06/09/2022 7:18 AM Name: Theodore Carr  DOB: [birth year]  MRN:  [id number] Unit: [id number]  CC: active HI/SI  Theodore Carr is a 37 y.o. 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  At [facility] 1/3-03/2023  Last 24h: No agitation PRNs, NAEON. Did not attend group.  Documented sleep last 24 hours: 7.25   Subjective:  On assessment today, patient was initially seen asleep in the room, awoken easily, no acute distress. Patient was pleasant, calm, cooperative during evaluation.   Reported tolerating seroquel well, that it is helping with his mood lability. Patient was ok with dc nicotine patch, he hasn't smoked for >5 years. Reassured patient about his cholesterol, and recommended diet modification. Otherwise, denied any other questions or concerns. Patient continues to be active in dispo, requesting information for [facility] call.   Patient side effects to current scheduled psychiatric medications: Denied  Mood: Depressed Sleep:Fair - No issues falling asleep because of seroquel. Woke up at night due to perceived voice calling his name and back discomfort. Appetite: Good  Suicidal Thoughts: No  Homicidal Thoughts: Yes, Passive HI Passive Intent and/or Plan: Without Intent Hallucinations: None  Review of Systems  Respiratory:  Negative for shortness of breath.   Cardiovascular:  Negative for chest pain and palpitations.  Gastrointestinal:  Negative for abdominal pain, constipation, diarrhea, nausea and vomiting.  Musculoskeletal:  Positive for  back pain.  Neurological:  Negative for dizziness, tremors and headaches.     Principal Problem: MDD (major depressive disorder), recurrent severe, without psychosis ([place]) Diagnosis: Principal Problem:   MDD (major depressive disorder), recurrent severe, without psychosis ([place]) Active Problems:   MDD (major depressive disorder), recurrent episode, severe ([place])   Severe benzodiazepine use disorder ([place])  Past Psychiatric History: See H&P Past Medical History:  Past Medical History:  Diagnosis Date   Compression fracture of T12 vertebra ([place]) 2008   Depression    Hypertension    Insomnia 04/21/2022    History reviewed. No pertinent surgical history. Family History:  Family History  Problem Relation Age of Onset   Drug abuse Cousin    Family Psychiatric History: See H&P Social History:  Social History   Substance and Sexual Activity  Alcohol Use Not Currently   Alcohol/week: 2.0 standard drinks of alcohol   Types: 2 Cans of beer per week   Comment: last used 4 days ago in prison     Social History   Substance and Sexual Activity  Drug Use Not Currently   Frequency: 2.0 times per week   Types: Cocaine   Comment: last used 4 days ago    Social History   Socioeconomic History   Marital status: Single    Spouse name: Not on file   Number of children: Not on file   Years of education: 12   Highest education level: GED or equivalent  Occupational History   Not on file  Tobacco Use   Smoking status: Former    Packs/day: 1.00    Years: 15.00    Total pack years: 15.00    Types: Cigarettes  Quit date: 03/18/2022    Years since quitting: 0.2   Smokeless tobacco: Former   Tobacco comments:    Refused nicotine patch and nicotine gum  Vaping Use   Vaping Use: Never used  Substance and Sexual Activity   Alcohol use: Not Currently    Alcohol/week: 2.0 standard drinks of alcohol    Types: 2 Cans of beer per week    Comment: last used 4 days ago in prison   Drug  use: Not Currently    Frequency: 2.0 times per week    Types: Cocaine    Comment: last used 4 days ago   Sexual activity: Yes  Other Topics Concern   Not on file  Social History Narrative   Not on file   Social Determinants of Health   Financial Resource Strain: Not on file  Food Insecurity: No Food Insecurity (06/06/2022)   Hunger Vital Sign    Worried About Running Out of Food in the Last Year: Never true    Ran Out of Food in the Last Year: Never true  Transportation Needs: Unknown (06/06/2022)   [place] - Hydrologist (Medical): Patient refused    Lack of Transportation (Non-Medical): Patient refused  Physical Activity: Not on file  Stress: Not on file  Social Connections: Not on file   Additional Social History:                         Current Medications: Current Facility-Administered Medications  Medication Dose Route Frequency Provider Last Rate Last Admin   alum & mag hydroxide-simeth (MAALOX/MYLANTA) 200-200-20 MG/5ML suspension 30 mL  30 mL Oral Q4H PRN Revonda Humphrey, NP       buPROPion (WELLBUTRIN XL) 24 hr tablet 150 mg  150 mg Oral Daily Revonda Humphrey, NP   150 mg at 06/08/22 0758   gabapentin (NEURONTIN) capsule 600 mg  600 mg Oral TID Merrily Brittle, DO   600 mg at 06/08/22 1721   hydrOXYzine (ATARAX) tablet 50 mg  50 mg Oral TID PRN Merrily Brittle, DO   50 mg at 06/09/22 [id number]   ibuprofen (ADVIL) tablet 200 mg  200 mg Oral Q6H PRN Merrily Brittle, DO   200 mg at 06/09/22 [id number]   magnesium hydroxide (MILK OF MAGNESIA) suspension 30 mL  30 mL Oral Daily PRN Revonda Humphrey, NP       nicotine (NICODERM CQ - dosed in mg/24 hours) patch 14 mg  14 mg Transdermal Daily Massengill, Nathan, MD       OLANZapine zydis (ZYPREXA) disintegrating tablet 10 mg  10 mg Oral Q8H PRN Merrily Brittle, DO       And   ziprasidone (GEODON) injection 20 mg  20 mg Intramuscular PRN Merrily Brittle, DO       QUEtiapine (SEROQUEL) tablet 25 mg  25  mg Oral q AM Merrily Brittle, DO       And   QUEtiapine (SEROQUEL) tablet 25 mg  25 mg Oral Q1200 Merrily Brittle, DO   25 mg at 06/08/22 1138   And   QUEtiapine (SEROQUEL) tablet 100 mg  100 mg Oral QHS Merrily Brittle, DO   100 mg at 06/08/22 1944   traZODone (DESYREL) tablet 50 mg  50 mg Oral QHS PRN Revonda Humphrey, NP   50 mg at 06/08/22 2126    Lab Results:  No results found for this or any previous visit (from the past 50  hour(s)).   Blood Alcohol level:  Lab Results  Component Value Date   ETH <10 06/06/2022   ETH <10 [id number]    Metabolic Disorder Labs: Lab Results  Component Value Date   HGBA1C 5.2 04/25/2022   MPG 103 04/25/2022   MPG 105 04/02/2022   Lab Results  Component Value Date   PROLACTIN 28.5 (H) 08/31/2016   Lab Results  Component Value Date   CHOL 201 (H) 04/02/2022   TRIG 207 (H) 04/02/2022   HDL 56 04/02/2022   CHOLHDL 3.6 04/02/2022   VLDL 41 (H) 04/02/2022   LDLCALC 104 (H) 04/02/2022   LDLCALC 76 08/31/2016    Physical Findings: BP (!) 110/90 (BP Location: Left Arm)   Pulse (!) 114   Temp (!) 97.5 F (36.4 C) (Oral)   Resp 18   Ht 5' 7"$  (1.702 m)   Wt 72.6 kg   SpO2 99%   BMI 25.06 kg/m   Physical Exam Physical Exam Vitals and nursing note reviewed.  Constitutional:      General: He is not in acute distress.    Appearance: He is not ill-appearing, toxic-appearing or diaphoretic.  HENT:     Head: Normocephalic.  Pulmonary:     Effort: Pulmonary effort is normal. No respiratory distress.  Neurological:     Mental Status: He is alert.     AIMS:   ,  ,  ,  ,      Psychiatric Specialty Exam: Presentation  General Appearance: Appropriate for Environment; Casual; Fairly Groomed  Eye Contact: Fair  Speech: Clear and Coherent; Normal Rate  Volume: Normal  Handedness:Right   Mood and Affect  Mood: Depressed  Affect: Appropriate; Congruent; Full Range   Thought Process  Thought Process: Coherent; Goal  Directed  Descriptions of Associations: Circumstantial   Thought Content Suicidal Thoughts:No With Plan  Homicidal Thoughts:Yes, Passive Without Intent  Hallucinations:None Telling me to kill myself  Ideas of Reference:None  Thought Content:Perseveration; Rumination   Sensorium  Memory:Immediate Good  Judgment:Impaired  Insight:Shallow   Executive Functions  Orientation:Full (Time, Place and Person)  Language:Good  Concentration:Good  [place] of Knowledge:Good   Psychomotor Activity  Psychomotor Activity:Normal   Assets  Assets:Communication Skills; Desire for Improvement; Resilience   Sleep  Quality:Fair  Documented sleep last 24 hours: 7.25   ASSESSMENT/PLAN: Diagnoses / Active Problems: Principal Problem:   MDD (major depressive disorder), recurrent severe, without psychosis ([place]) Active Problems:   MDD (major depressive disorder), recurrent episode, severe ([facility])   Severe benzodiazepine use disorder ([place])   Safety and Monitoring: Voluntary admission to inpatient psychiatric unit for safety, stabilization and treatment   Daily contact with patient to assess and evaluate symptoms and progress in treatment Patient's case to be discussed in multi-disciplinary team meeting Observation Level: q15 minute checks  Vital signs: q12 hours Precautions: suicide, elopement, and assault  2. Psychiatric Diagnoses and Treatment:  MDD v SIMD Continued wellbutrin 150 mg daily for depression Continued gabapentin 600 mg TID for mood lability - changed PM time to 6PM Continued Seroquel 25 mg qAM + 25 mg qNoon + 100 mg qPM - mood lability    3. Medical Issues Being Addressed:  Mild Hypertriglyceridemia See below. No need for fibrate or omega 3 at this time per the REDUCE-IT criteria. Recommended diet management  Back pain  NSAID use Back pain well controlled with motrin, given constant use, will start ppi as gastritis  ppx INCREASED motrin 200 mg to 400 mg q6hrs  STARTED protonix 20 mg daily  4. Routine and other pertinent labs:   BMI:25.05  CMP WNL CBC WNL BAL <10 A1c 5.2 (04/25/2022)  Lipid Panel: Chol 201, triglyceride 207, VLDL 41, LDL 104, otherwise WNL (04/02/2022) TSH 1.700 (04/02/2022)  EKG NSR, Qtc 402  4. Discharge Planning:  Social work and case management to assist with discharge planning and identification of hospital follow-up needs prior to discharge Estimated LOS: 5-7 days Discharge Concerns: Need to establish a safety plan; Medication compliance and effectiveness Discharge Goals: Return home with outpatient referrals for mental health follow-up including medication management/psychotherapy  [facility]po[facility] [place] Date: 06/13/2022  Treatment Plan Summary: Daily contact with patient to assess and evaluate symptoms and progress in treatment and Medication management  Total Time spent with patient: See attending attestation Patient's case was discussed with Attending Dian Situ, MD.   Signed: Merrily Brittle, DO Psychiatry Resident, PGY-2 [facility]  [address] [place], [place] [address] Ph: [phone] Fax: [phone] 06/09/2022, 7:18 AM

## 2022-06-09 NOTE — Progress Notes (Signed)
Pt in the hallway yelling and threatening writer, pt was informed to put his shirt on pt began Leisure centre manager and cursing stating he was going to Games developer, pt has violent history with the law. Pt very disrespectful towards staff

## 2022-06-09 NOTE — Progress Notes (Signed)
Pt denies SI/HI/AVH.  Pt is actively engaged in his treatment plan and often asks for medications before they are due.  Pt would like Atarax q 6 hours instead of q 8 hours.  RN administered general overview of [facility] services and spoke to LCSW regarding pt's request.  Pt is coping well at this time.  RN will continue to monitor pt's progress and provide support as needed.

## 2022-06-09 NOTE — Progress Notes (Signed)
   06/09/22 0630  15 Minute Checks  Location Dayroom  Visual Appearance Calm  Behavior Composed  Sleep (Behavioral Health Patients Only)  Calculate sleep? (Click Yes once per 24 hr at 0600 safety check) Yes  Documented sleep last 24 hours 7.25

## 2022-06-09 NOTE — Progress Notes (Signed)
Adult Psychoeducational Group Note  Date:  06/09/2022 Time:  10:45 AM  Group Topic/Focus:  Goals Group:   The focus of this group is to help patients establish daily goals to achieve during treatment and discuss how the patient can incorporate goal setting into their daily lives to aide in recovery. Orientation:   The focus of this group is to educate the patient on the purpose and policies of crisis stabilization and provide a format to answer questions about their admission.  The group details unit policies and expectations of patients while admitted.  Participation Level: Pt did not attend the orientation/goals group.

## 2022-06-09 NOTE — Progress Notes (Signed)
Adult Psychoeducational Group Note  Date:  06/09/2022 Time:  7:44 PM  Group Topic/Focus: Trivia  Participation Level:  Active  Participation Quality:  Appropriate  Affect:  Appropriate  Cognitive:  Appropriate  Insight: Appropriate  Engagement in Group:  Engaged  Modes of Intervention:  Discussion  Additional Comments:  Pt attended the trivia group and remained appropriate and engaged throughout the duration of the group.   Beryle Beams 06/09/2022, 7:44 PM

## 2022-06-09 NOTE — Progress Notes (Signed)
Pt visibly upset that it is too early to get Atarax.  Pt angry and agitated.  PRN Zyprexa given.  Pt has calmed down and is on the phone at this time.

## 2022-06-09 NOTE — Group Note (Signed)
Occupational Therapy Group Note  Group Topic:Coping Skills  Group Date: 06/09/2022 Start Time: 1430 End Time: 1500 Facilitators: Brantley Stage, OT   Group Description: Group encouraged increased engagement and participation through discussion and activity focused on "Coping Ahead." Patients were split up into teams and selected a card from a stack of positive coping strategies. Patients were instructed to act out/charade the coping skill for other peers to guess and receive points for their team. Discussion followed with a focus on identifying additional positive coping strategies and patients shared how they were going to cope ahead over the weekend while continuing hospitalization stay.  Therapeutic Goal(s): Identify positive vs negative coping strategies. Identify coping skills to be used during hospitalization vs coping skills outside of hospital/at home Increase participation in therapeutic group environment and promote engagement in treatment   Participation Level: Engaged   Participation Quality: Independent   Behavior: Appropriate   Speech/Thought Process: Relevant   Affect/Mood: Appropriate   Insight: Fair   Judgement: Fair   Individualization: pt was engaged in their participation of group discussion/activity. New skills identified  Modes of Intervention: Education  Patient Response to Interventions:  Attentive   Plan: Continue to engage patient in OT groups 2 - 3x/week.  06/09/2022  Brantley Stage, OT Cornell Barman, OT

## 2022-06-09 NOTE — Group Note (Signed)
[facility] LCSW Group Therapy Note   Group Date: 06/09/2022 Start Time: 1100 End Time: 1200   Type of Therapy/Topic:  Group Therapy:  Balance in Life  Participation Level:  Active   Description of Group:    This group will address the concept of balance and how it feels and looks when one is unbalanced. Patients will be encouraged to process areas in their lives that are out of balance, and identify reasons for remaining unbalanced. Facilitators will guide patients utilizing problem- solving interventions to address and correct the stressor making their life unbalanced. Understanding and applying boundaries will be explored and addressed for obtaining  and maintaining a balanced life. Patients will be encouraged to explore ways to assertively make their unbalanced needs known to significant others in their lives, using other group members and facilitator for support and feedback.  Therapeutic Goals: Patient will identify two or more emotions or situations they have that consume much of in their lives. Patient will identify signs/triggers that life has become out of balance:  Patient will identify two ways to set boundaries in order to achieve balance in their lives:  Patient will demonstrate ability to communicate their needs through discussion and/or role plays  Summary of Patient Progress:    Pt appropriately engaged throughout the session    Therapeutic Modalities:   Cognitive Behavioral Therapy Solution-Focused Therapy Assertiveness Training   Stacey Street, LCSW

## 2022-06-10 MED ORDER — QUETIAPINE FUMARATE 200 MG PO TABS
200.0000 mg | ORAL_TABLET | Freq: Every day | ORAL | Status: DC
  Administered 2022-06-10: 200 mg via ORAL
  Filled 2022-06-10 (×2): qty 1

## 2022-06-10 MED ORDER — HYDROXYZINE HCL 50 MG PO TABS
50.0000 mg | ORAL_TABLET | Freq: Four times a day (QID) | ORAL | Status: DC | PRN
  Administered 2022-06-10 – 2022-06-16 (×17): 50 mg via ORAL
  Filled 2022-06-10 (×18): qty 1

## 2022-06-10 MED ORDER — QUETIAPINE FUMARATE 100 MG PO TABS
100.0000 mg | ORAL_TABLET | Freq: Every morning | ORAL | Status: DC
  Filled 2022-06-10: qty 1

## 2022-06-10 MED ORDER — QUETIAPINE FUMARATE 100 MG PO TABS
100.0000 mg | ORAL_TABLET | ORAL | Status: DC
  Administered 2022-06-10 – 2022-06-11 (×2): 100 mg via ORAL
  Filled 2022-06-10 (×4): qty 1

## 2022-06-10 MED ORDER — QUETIAPINE FUMARATE 100 MG PO TABS
100.0000 mg | ORAL_TABLET | Freq: Every day | ORAL | Status: DC
  Filled 2022-06-10: qty 1

## 2022-06-10 MED ORDER — IRBESARTAN 75 MG PO TABS
75.0000 mg | ORAL_TABLET | Freq: Every day | ORAL | Status: DC
  Administered 2022-06-10 – 2022-06-13 (×4): 75 mg via ORAL
  Filled 2022-06-10 (×7): qty 1

## 2022-06-10 MED ORDER — QUETIAPINE FUMARATE 200 MG PO TABS
200.0000 mg | ORAL_TABLET | Freq: Every day | ORAL | Status: DC
  Filled 2022-06-10: qty 1

## 2022-06-10 NOTE — Progress Notes (Signed)
Pt continues to be irritable and labile.  Was able to control behavior tonight but appeared on edge.   06/10/22 2331  Psych Admission Type (Psych Patients Only)  Admission Status Voluntary  Psychosocial Assessment  Patient Complaints Anxiety;Agitation  Eye Contact Fair  Facial Expression Animated  Affect Anxious;Preoccupied;Labile  Speech Logical/coherent  Interaction Assertive;Demanding  Motor Activity Other (Comment) (WDL)  Appearance/Hygiene Unremarkable  Behavior Characteristics Appropriate to situation  Mood Anxious;Labile;Preoccupied;Irritable  Thought Process  Coherency WDL  Content WDL  Delusions None reported or observed  Perception WDL  Hallucination None reported or observed  Judgment Poor  Confusion None  Danger to Self  Current suicidal ideation? Denies  Self-Injurious Behavior No self-injurious ideation or behavior indicators observed or expressed   Agreement Not to Harm Self Yes  Description of Agreement verbal  Danger to Others  Danger to Others None reported or observed  Danger to Others Abnormal  Harmful Behavior to others No threats or harm toward other people  Destructive Behavior No threats or harm toward property

## 2022-06-10 NOTE — Progress Notes (Signed)
Pt has been calm and respectful this shift.  Pt denied SI/HI/AVH. Pt taking medications without incident and no adverse reactions were noted.  RN will continue to assess pt's status and intervene as indicated.

## 2022-06-10 NOTE — Group Note (Signed)
Recreation Therapy Group Note   Group Topic:Problem Solving  Group Date: 06/10/2022 Start Time: 0930 End Time: 1000 Facilitators: Oriana Horiuchi-McCall, LRT,CTRS Location: [address] Dayroom   Goal Area(s) Addresses:  Patient will effectively work with peer towards shared goal.  Patient will identify skills used to make activity successful.  Patient will identify how skills used during activity can be applied to reach post d/c goals.   Group Description: [employer]. In teams of 5-6, patients were given 11 craft pipe cleaners. Using the materials provided, patients were instructed to compete again the opposing team(s) to build the tallest free-standing structure from floor level. The activity was timed; difficulty increased by Probation officer as Pharmacist, hospital continued.  Systematically resources were removed with additional directions for example, placing one arm behind their back, working in silence, and shape stipulations. LRT facilitated post-activity discussion reviewing team processes and necessary communication skills involved in completion. Patients were encouraged to reflect how the skills utilized, or not utilized, in this activity can be incorporated to positively impact support systems post discharge.   Affect/Mood: Appropriate   Participation Level: Active   Participation Quality: Independent   Behavior: Appropriate   Speech/Thought Process: Focused   Insight: Good   Judgement: Good   Modes of Intervention: STEM Activity   Patient Response to Interventions:  Attentive   Education Outcome:  Acknowledges education and In group clarification offered    Clinical Observations/Individualized Feedback: Pt was called out of group to meet with doctor.  When pt returned, pt was more observant.  Pt would give suggestions to peers.  Pt also stated the group had to use communication to complete activity.  Pt went onto say a person has to be wiling to ask for help when  needed.    Plan: Continue to engage patient in RT group sessions 2-3x/week.   Lilliane Sposito-McCall, LRT,CTRS 06/10/2022 12:33 PM

## 2022-06-10 NOTE — Progress Notes (Signed)
Patient refused Protonix this morning C/O back pain 8/10, Ibuprofen prn given and atarax for anxiety.

## 2022-06-10 NOTE — Progress Notes (Signed)
[facility] MD Progress Note  Date: 06/10/2022 9:59 AM Name: Theodore Carr  DOB: Jun 06, 1985  MRN:  [id number] Unit: 0301/0301-01  CC: active HI/SI  Theodore Carr is a 37 y.o. 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  At [facility] 1/3-03/2023  Last 24h: Agitated, threatening staff.  Attended groups. Documented sleep last 24 hours: 9.25   Subjective:  On assessment today, patient was initially seen asleep in the room, awoken easily, no acute distress. Patient was pleasant, calm, cooperative during evaluation.   Still having significant mood lability and outbursts. Main trigger is scheduling of meds and group therapy. Denied side effects to increased seroquel dose. Amenable to further titration per below.   Mood: Depressed Sleep:Fair - no nightmares Appetite: Good  Suicidal Thoughts: No  Homicidal Thoughts: Yes, Passive HI Passive Intent and/or Plan: Without Intent Hallucinations: None  Review of Systems  Respiratory:  Negative for shortness of breath.   Cardiovascular:  Negative for chest pain and palpitations.  Gastrointestinal:  Negative for abdominal pain, constipation, diarrhea, nausea and vomiting.  Neurological:  Negative for dizziness, tremors and headaches.     Principal Problem: MDD (major depressive disorder), recurrent severe, without psychosis ([place]) Diagnosis: Principal Problem:   MDD (major depressive disorder), recurrent severe, without psychosis ([place]) Active Problems:   MDD (major depressive disorder), recurrent episode, severe ([place])   Severe benzodiazepine use disorder  ([place])  Past Psychiatric History: See H&P Past Medical History:  Past Medical History:  Diagnosis Date   Compression fracture of T12 vertebra ([place]) 2008   Depression    Hypertension    Insomnia 04/21/2022    History reviewed. No pertinent surgical history. Family History:  Family History  Problem Relation Age of Onset   Drug abuse Cousin    Family Psychiatric History: See H&P Social History:  Social History   Substance and Sexual Activity  Alcohol Use Not Currently   Alcohol/week: 2.0 standard drinks of alcohol   Types: 2 Cans of beer per week   Comment: last used 4 days ago in prison     Social History   Substance and Sexual Activity  Drug Use Not Currently   Frequency: 2.0 times per week   Types: Cocaine   Comment: last used 4 days ago    Social History   Socioeconomic History   Marital status: Single    Spouse name: Not on file   Number of children: Not on file   Years of education: 12   Highest education level: GED or equivalent  Occupational History   Not on file  Tobacco Use   Smoking status: Former    Packs/day: 1.00    Years: 15.00    Total pack years: 15.00    Types: Cigarettes    Quit date: 03/18/2022    Years since quitting: 0.2   Smokeless tobacco: Former   Tobacco comments:    Refused nicotine patch and nicotine gum  Vaping Use   Vaping Use: Never used  Substance and Sexual Activity   Alcohol use: Not Currently    Alcohol/week: 2.0 standard drinks of alcohol    Types: 2 Cans of beer  per week    Comment: last used 4 days ago in prison   Drug use: Not Currently    Frequency: 2.0 times per week    Types: Cocaine    Comment: last used 4 days ago   Sexual activity: Yes  Other Topics Concern   Not on file  Social History Narrative   Not on file   Social Determinants of Health   Financial Resource Strain: Not on file  Food Insecurity: No Food Insecurity (06/06/2022)   Hunger Vital Sign    Worried About Running Out of Food in the Last  Year: Never true    Ran Out of Food in the Last Year: Never true  Transportation Needs: Unknown (06/06/2022)   [place] - Hydrologist (Medical): Patient refused    Lack of Transportation (Non-Medical): Patient refused  Physical Activity: Not on file  Stress: Not on file  Social Connections: Not on file   Additional Social History:                         Current Medications: Current Facility-Administered Medications  Medication Dose Route Frequency Provider Last Rate Last Admin   alum & mag hydroxide-simeth (MAALOX/MYLANTA) 200-200-20 MG/5ML suspension 30 mL  30 mL Oral Q4H PRN Revonda Humphrey, NP       buPROPion (WELLBUTRIN XL) 24 hr tablet 150 mg  150 mg Oral Daily Revonda Humphrey, NP   150 mg at 06/10/22 0800   gabapentin (NEURONTIN) capsule 600 mg  600 mg Oral TID Merrily Brittle, DO   600 mg at 06/10/22 0800   hydrOXYzine (ATARAX) tablet 50 mg  50 mg Oral Q6H PRN Merrily Brittle, DO       ibuprofen (ADVIL) tablet 600 mg  600 mg Oral Q6H PRN Winfred Leeds, Nadir, MD   600 mg at 06/10/22 0615   magnesium hydroxide (MILK OF MAGNESIA) suspension 30 mL  30 mL Oral Daily PRN Revonda Humphrey, NP       OLANZapine zydis (ZYPREXA) disintegrating tablet 10 mg  10 mg Oral Q8H PRN Merrily Brittle, DO   10 mg at 06/09/22 1814   And   ziprasidone (GEODON) injection 20 mg  20 mg Intramuscular PRN Merrily Brittle, DO       pantoprazole (PROTONIX) EC tablet 20 mg  20 mg Oral Aldean Baker, Almyra Free, DO       [START ON 06/11/2022] QUEtiapine (SEROQUEL) tablet 100 mg  100 mg Oral q AM Merrily Brittle, DO       And   QUEtiapine (SEROQUEL) tablet 100 mg  100 mg Oral Q1200 Merrily Brittle, DO       And   QUEtiapine (SEROQUEL) tablet 200 mg  200 mg Oral QHS Merrily Brittle, DO       traZODone (DESYREL) tablet 50 mg  50 mg Oral QHS PRN Revonda Humphrey, NP   50 mg at 06/09/22 2005    Lab Results:  No results found for this or any previous visit (from the past 37  hour(s)).   Blood Alcohol level:  Lab Results  Component Value Date   [facility] <10 06/06/2022   ETH <10 [id number]    Metabolic Disorder Labs: Lab Results  Component Value Date   HGBA1C 5.2 04/25/2022   MPG 103 04/25/2022   MPG 105 04/02/2022   Lab Results  Component Value Date   PROLACTIN 28.5 (H) 08/31/2016   Lab Results  Component Value Date  CHOL 201 (H) 04/02/2022   TRIG 207 (H) 04/02/2022   HDL 56 04/02/2022   CHOLHDL 3.6 04/02/2022   VLDL 41 (H) 04/02/2022   LDLCALC 104 (H) 04/02/2022   LDLCALC 76 08/31/2016    Physical Findings: BP (!) 132/100 (BP Location: Left Arm)   Pulse (!) 105   Temp 97.6 F (36.4 C) (Oral)   Resp 18   Ht '5\' 7"'$  (1.702 m)   Wt 72.6 kg   SpO2 94%   BMI 25.06 kg/m   Physical Exam Physical Exam Vitals and nursing note reviewed.  Constitutional:      General: He is not in acute distress.    Appearance: He is not ill-appearing, toxic-appearing or diaphoretic.  HENT:     Head: Normocephalic.  Pulmonary:     Effort: Pulmonary effort is normal. No respiratory distress.  Neurological:     Mental Status: He is alert.     AIMS:   ,  ,  ,  ,      Psychiatric Specialty Exam: Presentation  General Appearance: Appropriate for Environment; Casual; Fairly Groomed  Eye Contact: Fair  Speech: Clear and Coherent; Normal Rate  Volume: Normal  Handedness:Right   Mood and Affect  Mood: Depressed  Affect: Appropriate; Congruent; Full Range   Thought Process  Thought Process: Coherent; Goal Directed  Descriptions of Associations: Circumstantial   Thought Content Suicidal Thoughts:No With Plan  Homicidal Thoughts:Yes, Passive Without Intent  Hallucinations:None Telling me to kill myself  Ideas of Reference:None  Thought Content:Perseveration; Rumination   Sensorium  Memory:Immediate Good  Judgment:Impaired  Insight:Shallow   Executive Functions  Orientation:Full (Time, Place and  Person)  Language:Good  Concentration:Good  [place] of Knowledge:Good   Psychomotor Activity  Psychomotor Activity:Normal   Assets  Assets:Communication Skills; Desire for Improvement; Resilience   Sleep  Quality:Fair  Documented sleep last 24 hours: 9.25   ASSESSMENT/PLAN: Diagnoses / Active Problems: Principal Problem:   MDD (major depressive disorder), recurrent severe, without psychosis ([place]) Active Problems:   MDD (major depressive disorder), recurrent episode, severe (HCC)   Severe benzodiazepine use disorder ([place])   Safety and Monitoring: Voluntary admission to inpatient psychiatric unit for safety, stabilization and treatment   Daily contact with patient to assess and evaluate symptoms and progress in treatment Patient's case to be discussed in multi-disciplinary team meeting Observation Level: q15 minute checks  Vital signs: q12 hours Precautions: suicide, elopement, and assault  2. Psychiatric Diagnoses and Treatment:  MDD v SIMD  IED Still labile with irritability. Pt very particular, doesn't handle deviation from schedule well.  Continued wellbutrin 150 mg daily for depression Continued gabapentin 600 mg TID for mood lability - changed PM time to 6PM INCREASED Seroquel 100 mg qAM + 100 mg qNoon + 200 mg qPM - mood lability    3. Medical Issues Being Addressed:  HTN  STARTED Avapro 75 mg daily medicine, appreciate assistance and recommendation  Mild Hypertriglyceridemia Recommended diet management  Back pain  NSAID use Back pain well controlled with motrin, given constant use, will start ppi as gastritis ppx Continued motrin 400 mg q6hrs  Continued protonix 20 mg daily  4. Routine and other pertinent labs:   BMI:25.05  CMP WNL CBC WNL BAL <10 A1c 5.2 (04/25/2022)  Lipid Panel: Chol 201, triglyceride 207, VLDL 41, LDL 104, otherwise WNL (04/02/2022) TSH 1.700 (04/02/2022)  EKG NSR, Qtc 402  4. Discharge  Planning:  Social work and case management to assist with discharge planning and identification  of hospital follow-up needs prior to discharge Estimated LOS: 5-7 days Discharge Concerns: Need to establish a safety plan; Medication compliance and effectiveness Discharge Goals: Return home with outpatient referrals for mental health follow-up including medication management/psychotherapy  [facility]po: [facility] [place] Date: 06/13/2022  Treatment Plan Summary: Daily contact with patient to assess and evaluate symptoms and progress in treatment and Medication management  Total Time spent with patient: See attending attestation Patient's case was discussed with Attending Dian Situ, MD.   Signed: Merrily Brittle, DO Psychiatry Resident, PGY-2 [facility] - Adult  [address] [place], [place] [address] Ph: [phone] Fax: [phone] 06/10/2022, 9:59 AM

## 2022-06-10 NOTE — Progress Notes (Signed)
Chaplain met with Theodore Carr at his request.  He wanted to talk about why things were happening in his life, in particular, why he was kicked out of the program that he felt was the right thing for him.  He went off of the medications that they required him to go off of and also came off of depression and anxiety medication.  He said that he felt the devil was taking over his mind because of the violent images that he sees.  Chaplain asked if that has improved some with the medications since his admission to the hospital. He stated that it has gotten better.  Chaplain shared a spiritual reframing that the medication may be part of how God is bringing healing to him and that the program that required him to go off of medication may not have been the right program for him.  Chaplain also provided prayer, at his request.   [name], [place] Pager, [phone]

## 2022-06-10 NOTE — BHH Counselor (Addendum)
[facility]/BMU LCSW Progress Note   06/10/2022    3:49 PM  Theodore Carr      Type of Note: [facility] Follow Up   CSW spoke with [name] the intake coordinator regarding patient referral and [name] said that the nurse was still reviewing the referral, but will call CSW back this evening if she has an answer or first thing Monday morning about admission.       Signed:   Silas Flood, MSW, [facility] 06/10/2022 3:49 PM

## 2022-06-10 NOTE — Group Note (Signed)
Date:  06/10/2022 Time:  9:44 AM  Group Topic/Focus:  Orientation:   The focus of this group is to educate the patient on the purpose and policies of crisis stabilization and provide a format to answer questions about their admission.  The group details unit policies and expectations of patients while admitted.    Participation Level:  Active  Participation Quality:  Appropriate  Affect:  Appropriate  Cognitive:  Appropriate  Insight: Appropriate  Engagement in Group:  Engaged  Modes of Intervention:  Discussion  Additional Comments:     Jerrye Beavers 06/10/2022, 9:44 AM

## 2022-06-11 MED ORDER — QUETIAPINE FUMARATE 100 MG PO TABS
100.0000 mg | ORAL_TABLET | ORAL | Status: DC
  Administered 2022-06-11 – 2022-06-13 (×4): 100 mg via ORAL
  Filled 2022-06-11 (×11): qty 1

## 2022-06-11 MED ORDER — QUETIAPINE FUMARATE 200 MG PO TABS
200.0000 mg | ORAL_TABLET | Freq: Every day | ORAL | Status: DC
  Administered 2022-06-11 – 2022-06-12 (×2): 200 mg via ORAL
  Filled 2022-06-11 (×4): qty 1

## 2022-06-11 NOTE — Progress Notes (Signed)
[facility] MD Progress Note  Date: 06/11/2022 8:41 AM Name: Theodore Carr  DOB: Oct 21, 1985  MRN:  [id number] Unit: 0301/0301-01  CC: active HI/SI  Theodore Carr is a 37 y.o. 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  At [facility] 1/3-03/2023  Last 24h: Agitated, threatening staff.  Attended groups. Documented sleep last 24 hours: 7.5   Subjective:  On assessment today, patient was initially seen in the day room, no acute distress. Patient was pleasant, calm, cooperative during evaluation.   Multiple nightly awakening with feelings of urgency, denied nightmares. Reported adequate energy. Denied medication side effects, reported that he felt yesterday was a good day. Denied side effects to starting anti-hypertensive.   Mood: Depressed, improving Sleep:Fair - no nightmares, multiple night time awakenings, would fall immediately back to sleep Appetite: Good  Suicidal Thoughts: No  Homicidal Thoughts: Yes, Passive HI Passive Intent and/or Plan: Without Intent Hallucinations: None  Review of Systems  Eyes:  Negative for blurred vision.  Respiratory:  Negative for shortness of breath.   Cardiovascular:  Negative for chest pain and palpitations.  Gastrointestinal:  Negative for abdominal pain, constipation, diarrhea, heartburn, nausea and vomiting.  Musculoskeletal:  Positive for back pain.  Neurological:  Negative for dizziness, tremors and headaches.     Principal Problem: MDD (major depressive disorder), recurrent severe, without psychosis ([place]) Diagnosis: Principal Problem:   MDD (major  depressive disorder), recurrent severe, without psychosis ([place]) Active Problems:   MDD (major depressive disorder), recurrent episode, severe ([place])   Severe benzodiazepine use disorder ([place])  Past Psychiatric History: See H&P Past Medical History:  Past Medical History:  Diagnosis Date   Compression fracture of T12 vertebra ([place]) 2008   Depression    Hypertension    Insomnia 04/21/2022    History reviewed. No pertinent surgical history. Family History:  Family History  Problem Relation Age of Onset   Drug abuse Cousin    Family Psychiatric History: See H&P Social History:  Social History   Substance and Sexual Activity  Alcohol Use Not Currently   Alcohol/week: 2.0 standard drinks of alcohol   Types: 2 Cans of beer per week   Comment: last used 4 days ago in prison     Social History   Substance and Sexual Activity  Drug Use Not Currently   Frequency: 2.0 times per week   Types: Cocaine   Comment: last used 4 days ago    Social History   Socioeconomic History   Marital status: Single    Spouse name: Not on file   Number of children: Not on file   Years of education: 12   Highest education level: GED or equivalent  Occupational History   Not on file  Tobacco Use   Smoking status: Former    Packs/day: 1.00    Years: 15.00    Total pack years: 15.00    Types: Cigarettes    Quit date: 03/18/2022    Years since quitting: 0.2   Smokeless tobacco: Former   Tobacco comments:    Refused nicotine patch and nicotine gum  Vaping Use   Vaping Use: Never used  Substance  and Sexual Activity   Alcohol use: Not Currently    Alcohol/week: 2.0 standard drinks of alcohol    Types: 2 Cans of beer per week    Comment: last used 4 days ago in prison   Drug use: Not Currently    Frequency: 2.0 times per week    Types: Cocaine    Comment: last used 4 days ago   Sexual activity: Yes  Other Topics Concern   Not on file  Social History Narrative   Not on file   Social  Determinants of Health   Financial Resource Strain: Not on file  Food Insecurity: No Food Insecurity (06/06/2022)   Hunger Vital Sign    Worried About Running Out of Food in the Last Year: Never true    Ran Out of Food in the Last Year: Never true  Transportation Needs: Unknown (06/06/2022)   [place] - Hydrologist (Medical): Patient refused    Lack of Transportation (Non-Medical): Patient refused  Physical Activity: Not on file  Stress: Not on file  Social Connections: Not on file   Additional Social History:                         Current Medications: Current Facility-Administered Medications  Medication Dose Route Frequency Provider Last Rate Last Admin   alum & mag hydroxide-simeth (MAALOX/MYLANTA) 200-200-20 MG/5ML suspension 30 mL  30 mL Oral Q4H PRN Revonda Humphrey, NP       buPROPion (WELLBUTRIN XL) 24 hr tablet 150 mg  150 mg Oral Daily Revonda Humphrey, NP   150 mg at 06/11/22 0739   gabapentin (NEURONTIN) capsule 600 mg  600 mg Oral TID Merrily Brittle, DO   600 mg at 06/11/22 0739   hydrOXYzine (ATARAX) tablet 50 mg  50 mg Oral Q6H PRN Merrily Brittle, DO   50 mg at 06/11/22 [id number]   ibuprofen (ADVIL) tablet 600 mg  600 mg Oral Q6H PRN Dian Situ, MD   600 mg at 06/11/22 0610   irbesartan (AVAPRO) tablet 75 mg  75 mg Oral Daily Merrily Brittle, DO   75 mg at 06/11/22 0739   magnesium hydroxide (MILK OF MAGNESIA) suspension 30 mL  30 mL Oral Daily PRN Revonda Humphrey, NP       OLANZapine zydis (ZYPREXA) disintegrating tablet 10 mg  10 mg Oral Q8H PRN Merrily Brittle, DO   10 mg at 06/10/22 2117   And   ziprasidone (GEODON) injection 20 mg  20 mg Intramuscular PRN Merrily Brittle, DO       pantoprazole (PROTONIX) EC tablet 20 mg  20 mg Oral Aldean Baker, Almyra Free, DO       QUEtiapine (SEROQUEL) tablet 100 mg  100 mg Oral [id number] Merrily Brittle, DO       And   QUEtiapine (SEROQUEL) tablet 200 mg  200 mg Oral QHS Merrily Brittle, DO        traZODone (DESYREL) tablet 50 mg  50 mg Oral QHS PRN Revonda Humphrey, NP   50 mg at 06/10/22 2117    Lab Results:  No results found for this or any previous visit (from the past 60 hour(s)).   Blood Alcohol level:  Lab Results  Component Value Date   [facility] <10 06/06/2022   ETH <10 [id number]    Metabolic Disorder Labs: Lab Results  Component Value Date   HGBA1C 5.2 04/25/2022   MPG 103 04/25/2022   MPG  105 04/02/2022   Lab Results  Component Value Date   PROLACTIN 28.5 (H) 08/31/2016   Lab Results  Component Value Date   CHOL 201 (H) 04/02/2022   TRIG 207 (H) 04/02/2022   HDL 56 04/02/2022   CHOLHDL 3.6 04/02/2022   VLDL 41 (H) 04/02/2022   LDLCALC 104 (H) 04/02/2022   LDLCALC 76 08/31/2016    Physical Findings: BP (!) 130/99 (BP Location: Left Arm)   Pulse (!) 121   Temp 97.6 F (36.4 C) (Oral)   Resp 16   Ht '5\' 7"'$  (1.702 m)   Wt 72.6 kg   SpO2 96%   BMI 25.06 kg/m   Physical Exam Physical Exam Vitals and nursing note reviewed.  Constitutional:      General: He is not in acute distress.    Appearance: He is not ill-appearing, toxic-appearing or diaphoretic.  HENT:     Head: Normocephalic.  Pulmonary:     Effort: Pulmonary effort is normal. No respiratory distress.  Neurological:     Mental Status: He is alert.     AIMS:   ,  ,  ,  ,      Psychiatric Specialty Exam: Presentation  General Appearance: Appropriate for Environment; Casual; Fairly Groomed  Eye Contact: Fair  Speech: Clear and Coherent; Normal Rate  Volume: Normal  Handedness:Right   Mood and Affect  Mood: Depressed  Affect: Appropriate; Congruent; Full Range   Thought Process  Thought Process: Coherent; Goal Directed  Descriptions of Associations: Circumstantial   Thought Content Suicidal Thoughts:No With Plan  Homicidal Thoughts:Yes, Passive Without Intent  Hallucinations:denied Ideas of Reference:None  Thought Content:Perseveration;  Rumination   Sensorium  Memory:Immediate Good  Judgment:Impaired  Insight:Shallow   Executive Functions  Orientation:Full (Time, Place and Person)  Language:Good  Concentration:Good  Attention:Good  [place] of Knowledge:Good   Psychomotor Activity  Psychomotor Activity:Normal   Assets  Assets:Communication Skills; Desire for Improvement; Resilience   Sleep  Quality:Fair  Documented sleep last 24 hours: 7.5   ASSESSMENT/PLAN: Diagnoses / Active Problems: Principal Problem:   MDD (major depressive disorder), recurrent severe, without psychosis ([place]) Active Problems:   MDD (major depressive disorder), recurrent episode, severe (HCC)   Severe benzodiazepine use disorder ([place])   Safety and Monitoring: Voluntary admission to inpatient psychiatric unit for safety, stabilization and treatment   Daily contact with patient to assess and evaluate symptoms and progress in treatment Patient's case to be discussed in multi-disciplinary team meeting Observation Level: q15 minute checks  Vital signs: q12 hours Precautions: suicide, elopement, and assault  2. Psychiatric Diagnoses and Treatment:  MDD v SIMD  IED Still labile with irritability. Pt very particular, doesn't handle deviation from schedule well.  Continued wellbutrin 150 mg daily for depression Continued gabapentin 600 mg TID for mood lability - changed PM time to 6PM Continued Seroquel 100 mg qAM + 100 mg qNoon + 200 mg qPM - mood lability    3. Medical Issues Being Addressed:  HTN  Continued Avapro 75 mg daily medicine, appreciate assistance and recommendation  Mild Hypertriglyceridemia Recommended diet management  Back pain  NSAID use Back pain well controlled with motrin, given constant use, will start ppi as gastritis ppx Continued motrin 400 mg q6hrs  Continued protonix 20 mg daily  4. Routine and other pertinent labs:   BMI:25.05  CMP WNL CBC WNL BAL <10 A1c 5.2 (04/25/2022)   Lipid Panel: Chol 201, triglyceride 207, VLDL 41, LDL 104, otherwise WNL (04/02/2022) TSH 1.700 (04/02/2022)  EKG  NSR, Qtc 402  4. Discharge Planning:  Social work and case management to assist with discharge planning and identification of hospital follow-up needs prior to discharge Estimated LOS: 5-7 days Discharge Concerns: Need to establish a safety plan; Medication compliance and effectiveness Discharge Goals: Return home with outpatient referrals for mental health follow-up including medication management/psychotherapy  [facility]po: [facility] [place] Date: 06/14/2022  Treatment Plan Summary: Daily contact with patient to assess and evaluate symptoms and progress in treatment and Medication management  Total Time spent with patient: See attending attestation Patient's case was discussed with Attending Dian Situ, MD.   Signed: Merrily Brittle, DO Psychiatry Resident, PGY-2 [facility] - Adult  [address] [place], [place] [address] Ph: [phone] Fax: [phone] 06/11/2022, 8:41 AM

## 2022-06-11 NOTE — Progress Notes (Signed)
   06/11/22 2306  Psych Admission Type (Psych Patients Only)  Admission Status Voluntary  Psychosocial Assessment  Patient Complaints Anxiety  Eye Contact Fair  Facial Expression Anxious;Animated  Affect Appropriate to circumstance  Speech Logical/coherent  Interaction Assertive  Motor Activity Other (Comment) (WDL)  Appearance/Hygiene Unremarkable  Behavior Characteristics Appropriate to situation  Mood Anxious;Labile  Thought Process  Coherency WDL  Content WDL  Delusions None reported or observed  Perception WDL  Hallucination None reported or observed  Judgment Poor  Confusion None  Danger to Self  Current suicidal ideation? Denies  Self-Injurious Behavior No self-injurious ideation or behavior indicators observed or expressed   Agreement Not to Harm Self Yes  Description of Agreement verbal  Danger to Others  Danger to Others None reported or observed  Danger to Others Abnormal  Harmful Behavior to others No threats or harm toward other people  Destructive Behavior No threats or harm toward property

## 2022-06-11 NOTE — Group Note (Unsigned)
Date:  06/11/2022 Time:  10:20 PM  Group Topic/Focus:  Wrap-Up Group:   The focus of this group is to help patients review their daily goal of treatment and discuss progress on daily workbooks.     Participation Level:  {BHH PARTICIPATION HD:996081  Participation Quality:  {BHH PARTICIPATION QUALITY:22265}  Affect:  {BHH AFFECT:22266}  Cognitive:  {BHH COGNITIVE:22267}  Insight: {BHH Insight2:20797}  Engagement in Group:  {BHH ENGAGEMENT IN JY:[id number]  Modes of Intervention:  {BHH MODES OF INTERVENTION:22269}  Additional Comments:  ***  Debe Coder 06/11/2022, 10:20 PM

## 2022-06-11 NOTE — Group Note (Signed)
Date:  06/11/2022 Time:  9:24 PM  Group Topic/Focus:  Wrap-Up Group:   The focus of this group is to help patients review their daily goal of treatment and discuss progress on daily workbooks.    Participation Level:  Active  Participation Quality:  Appropriate  Affect:  Appropriate  Cognitive:  Appropriate  Insight: Appropriate  Engagement in Group:  Engaged  Modes of Intervention:  Education and Exploration  Additional Comments:          Patient attended and participated in group tonight. He reports that he learn God is moving in his life. He is rebuilding rebuilding bridges. He will be alright.  Salley Scarlet [facility] 06/11/2022, 9:24 PM

## 2022-06-11 NOTE — BHH Group Notes (Signed)
Goals Group 06/11/22   Group Focus: affirmation, clarity of thought, and goals/reality orientation Treatment Modality:  Psychoeducation Interventions utilized were assignment, group exercise, and support Purpose: To be able to understand and verbalize the reason for their admission to the hospital. To understand that the medication helps with their chemical imbalance but they also need to work on their choices in life. To be challenged to develop a list of 30 positives about themselves. Also introduce the concept that "feelings" are not reality.  Participation Level:  Active  Participation Quality:  Appropriate  Affect:  Appropriate  Cognitive:  Appropriate  Insight:  Improving  Engagement in Group:  Engaged  Additional Comments:  .Marland KitchenMarland KitchenRates his energy at a 8.5/10. Pt participated in the group.  Theodore Carr

## 2022-06-11 NOTE — Progress Notes (Signed)
   06/11/22 1200  Psych Admission Type (Psych Patients Only)  Admission Status Voluntary  Psychosocial Assessment  Patient Complaints Anxiety;Depression  Eye Contact Fair  Facial Expression Anxious  Affect Anxious  Speech Logical/coherent  Interaction Assertive  Motor Activity Other (Comment) (level 3 observation)  Appearance/Hygiene Unremarkable  Behavior Characteristics Cooperative  Mood Anxious  Thought Process  Coherency WDL  Content WDL  Delusions None reported or observed  Perception WDL  Hallucination None reported or observed  Judgment Poor  Confusion None  Danger to Self  Current suicidal ideation? Denies  Self-Injurious Behavior No self-injurious ideation or behavior indicators observed or expressed   Agreement Not to Harm Self Yes  Description of Agreement verbal contract for safety  Danger to Others  Danger to Others None reported or observed  Danger to Others Abnormal  Harmful Behavior to others No threats or harm toward other people  Destructive Behavior No threats or harm toward property

## 2022-06-11 NOTE — Group Note (Unsigned)
Date:  06/11/2022 Time:  9:43 PM  Group Topic/Focus:  Wrap-Up Group:   The focus of this group is to help patients review their daily goal of treatment and discuss progress on daily workbooks.     Participation Level:  {BHH PARTICIPATION HD:996081  Participation Quality:  {BHH PARTICIPATION QUALITY:22265}  Affect:  {BHH AFFECT:22266}  Cognitive:  {BHH COGNITIVE:22267}  Insight: {BHH Insight2:20797}  Engagement in Group:  {BHH ENGAGEMENT IN JY:[id number]  Modes of Intervention:  {BHH MODES OF INTERVENTION:22269}  Additional Comments:  ***  Debe Coder 06/11/2022, 9:43 PM

## 2022-06-11 NOTE — BHH Group Notes (Signed)
[place] Group Notes:  (Nursing/MHT/Case Management/Adjunct)  Date:  06/11/2022  Time:  9:09 AM  Type of Therapy:  Group Therapy  Participation Level:  Active  Participation Quality:  Sharing  Affect:  Appropriate  Cognitive:  Appropriate  Insight:  Good  Engagement in Group:  Engaged  Modes of Intervention:  Problem-solving  Summary of Progress/Problems:  Theodore Carr 06/11/2022, 9:09 AM

## 2022-06-12 MED ORDER — BUSPIRONE HCL 10 MG PO TABS
10.0000 mg | ORAL_TABLET | Freq: Three times a day (TID) | ORAL | Status: DC
  Administered 2022-06-12 – 2022-06-13 (×3): 10 mg via ORAL
  Filled 2022-06-12 (×9): qty 1

## 2022-06-12 NOTE — Plan of Care (Signed)
  Problem: Education: Goal: Ability to state activities that reduce stress will improve Outcome: Progressing   Problem: Education: Goal: Utilization of techniques to improve thought processes will improve Outcome: Progressing Goal: Knowledge of the prescribed therapeutic regimen will improve Outcome: Progressing

## 2022-06-12 NOTE — Group Note (Signed)
Date:  06/12/2022 Time:  1:52 PM  Group Topic/Focus:  Personal Choices and Values:   The focus of this group is to help patients assess and explore the importance of values in their lives, how their values affect their decisions, how they express their values and what opposes their expression.    Participation Level:  Active  Participation Quality:  Appropriate  Affect:  Appropriate  Cognitive:  Appropriate  Insight: Appropriate  Engagement in Group:  Engaged  Modes of Intervention:  Exploration  Additional Comments:   Group Topic/Focus:  Personal Choices and Values:   Pto  understand why he thinks the way he does.  Jerrye Beavers 06/12/2022, 1:52 PM

## 2022-06-12 NOTE — Group Note (Signed)
LCSW Group Therapy Note  Group Date: 06/12/2022 Start Time: [id number] End Time: 1100   Type of Therapy and Topic:  Group Therapy - How To Cope with Nervousness about Discharge   Participation Level:  Did Not Attend   Description of Group This process group involved identification of patients' feelings about discharge. Some of them are scheduled to be discharged soon, while others are new admissions, but each of them was asked to share thoughts and feelings surrounding discharge from the hospital. One common theme was that they are excited at the prospect of going home, while another was that many of them are apprehensive about sharing why they were hospitalized. Patients were given the opportunity to discuss these feelings with their peers in preparation for discharge.  Therapeutic Goals  Patient will identify their overall feelings about pending discharge. Patient will think about how they might proactively address issues that they believe will once again arise once they get home (i.e. with parents). Patients will participate in discussion about having hope for change.   Summary of Patient Progress:  did not attend   Therapeutic Modalities Cognitive Behavioral Therapy   Lubertha South, Latanya Presser 06/12/2022  3:13 PM

## 2022-06-12 NOTE — Progress Notes (Cosign Needed Addendum)
[facility] MD Progress Note  Date: 06/12/2022 12:35 PM Name: Theodore Carr  DOB: [birth year]  MRN:  [id number] Unit: [id number]/[id number]  CC: active HI/SI  Theodore Carr is a 37 y.o. 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  At [facility] 1/3-03/2023  Last 24h: Per RN, patient was irritable after conversation with brother. Documented sleep last 24 hours: 7.75   Subjective:  On assessment today, patient was initially seen in the day room, no acute distress. Patient was pleasant, calm, cooperative during evaluation.   Still having residual anxiety, requested that Vistaril be along with scheduled Seroquel.  Patient declined increasing Seroquel instead, believing he is on too much Seroquel.  Patient stated he was on clonidine, BuSpar in the past.  He is open to starting BuSpar.  Mood: Anxious, improving Sleep:Good - no nightmares Appetite: Good  Suicidal Thoughts: No (-)  Denied HI Hallucinations: None  Review of Systems  Eyes:  Negative for blurred vision.  Respiratory:  Negative for shortness of breath.   Cardiovascular:  Negative for chest pain and palpitations.  Gastrointestinal:  Negative for abdominal pain, constipation, diarrhea, heartburn, nausea and vomiting.  Neurological:  Negative for dizziness, tremors and headaches.    Principal Problem: MDD (major depressive disorder), recurrent severe, without psychosis (Theodore Carr) Diagnosis: Principal Problem:   MDD (major depressive disorder), recurrent severe, without psychosis (Theodore Carr) Active Problems:   MDD (major depressive disorder), recurrent  episode, severe (Theodore Carr)   Severe benzodiazepine use disorder ([place])  Past Psychiatric History: See H&P Past Medical History:  Past Medical History:  Diagnosis Date   Compression fracture of T12 vertebra ([place]) 2008   Depression    Hypertension    Insomnia 04/21/2022    History reviewed. No pertinent surgical history. Family History:  Family History  Problem Relation Age of Onset   Drug abuse Cousin    Family Psychiatric History: See H&P Social History:  Social History   Substance and Sexual Activity  Alcohol Use Not Currently   Alcohol/week: 2.0 standard drinks of alcohol   Types: 2 Cans of beer per week   Comment: last used 4 days ago in prison     Social History   Substance and Sexual Activity  Drug Use Not Currently   Frequency: 2.0 times per week   Types: Cocaine   Comment: last used 4 days ago    Social History   Socioeconomic History   Marital status: Single    Spouse name: Not on file   Number of children: Not on file   Years of education: 12   Highest education level: GED or equivalent  Occupational History   Not on file  Tobacco Use   Smoking status: Former    Packs/day: 1.00    Years: 15.00    Total pack years: 15.00    Types: Cigarettes    Quit date: 03/18/2022    Years since quitting: 0.2   Smokeless tobacco: Former   Tobacco comments:    Refused nicotine patch and nicotine gum  Vaping Use   Vaping Use: Never used  Substance and Sexual Activity   Alcohol use: Not Currently  Alcohol/week: 2.0 standard drinks of alcohol    Types: 2 Cans of beer per week    Comment: last used 4 days ago in prison   Drug use: Not Currently    Frequency: 2.0 times per week    Types: Cocaine    Comment: last used 4 days ago   Sexual activity: Yes  Other Topics Concern   Not on file  Social History Narrative   Not on file   Social Determinants of Health   Financial Resource Strain: Not on file  Food Insecurity: No Food Insecurity (06/06/2022)   Hunger  Vital Sign    Worried About Running Out of Food in the Last Year: Never true    Ran Out of Food in the Last Year: Never true  Transportation Needs: Unknown (06/06/2022)   [place] - Hydrologist (Medical): Patient refused    Lack of Transportation (Non-Medical): Patient refused  Physical Activity: Not on file  Stress: Not on file  Social Connections: Not on file   Additional Social History:                         Current Medications: Current Facility-Administered Medications  Medication Dose Route Frequency Provider Last Rate Last Admin   alum & mag hydroxide-simeth (MAALOX/MYLANTA) 200-200-20 MG/5ML suspension 30 mL  30 mL Oral Q4H PRN Revonda Humphrey, NP       buPROPion (WELLBUTRIN XL) 24 hr tablet 150 mg  150 mg Oral Daily Revonda Humphrey, NP   150 mg at 06/12/22 0734   gabapentin (NEURONTIN) capsule 600 mg  600 mg Oral TID Merrily Brittle, DO   600 mg at 06/12/22 0734   hydrOXYzine (ATARAX) tablet 50 mg  50 mg Oral Q6H PRN Merrily Brittle, DO   50 mg at 06/12/22 [id number]   ibuprofen (ADVIL) tablet 600 mg  600 mg Oral Q6H PRN Dian Situ, MD   600 mg at 06/12/22 [id number]   irbesartan (AVAPRO) tablet 75 mg  75 mg Oral Daily Merrily Brittle, DO   75 mg at 06/12/22 0734   magnesium hydroxide (MILK OF MAGNESIA) suspension 30 mL  30 mL Oral Daily PRN Revonda Humphrey, NP       OLANZapine zydis (ZYPREXA) disintegrating tablet 10 mg  10 mg Oral Q8H PRN Merrily Brittle, DO   10 mg at 06/12/22 [id number]   And   ziprasidone (GEODON) injection 20 mg  20 mg Intramuscular PRN Merrily Brittle, DO       pantoprazole (PROTONIX) EC tablet 20 mg  20 mg Oral Aldean Baker, Almyra Free, DO   20 mg at 06/12/22 0734   QUEtiapine (SEROQUEL) tablet 100 mg  100 mg Oral [id number] Merrily Brittle, DO   100 mg at 06/12/22 1205   And   QUEtiapine (SEROQUEL) tablet 200 mg  200 mg Oral QHS Merrily Brittle, DO   200 mg at 06/11/22 1945   traZODone (DESYREL) tablet 50 mg  50 mg Oral QHS PRN Revonda Humphrey, NP   50 mg at 06/11/22 2049    Lab Results:  No results found for this or any previous visit (from the past 49 hour(s)).   Blood Alcohol level:  Lab Results  Component Value Date   [facility] <10 06/06/2022   ETH <10 [id number]    Metabolic Disorder Labs: Lab Results  Component Value Date   HGBA1C 5.2 04/25/2022   MPG 103 04/25/2022   MPG 105 04/02/2022  Lab Results  Component Value Date   PROLACTIN 28.5 (H) 08/31/2016   Lab Results  Component Value Date   CHOL 201 (H) 04/02/2022   TRIG 207 (H) 04/02/2022   HDL 56 04/02/2022   CHOLHDL 3.6 04/02/2022   VLDL 41 (H) 04/02/2022   LDLCALC 104 (H) 04/02/2022   LDLCALC 76 08/31/2016    Physical Findings: BP (!) 124/96 (BP Location: Left Arm)   Pulse (!) 106   Temp 97.6 F (36.4 C) (Oral)   Resp 16   Ht '5\' 7"'$  (1.702 m)   Wt 72.6 kg   SpO2 98%   BMI 25.06 kg/m   Physical Exam Physical Exam Vitals and nursing note reviewed.  Constitutional:      General: He is not in acute distress.    Appearance: He is not ill-appearing, toxic-appearing or diaphoretic.  HENT:     Head: Normocephalic.  Pulmonary:     Effort: Pulmonary effort is normal. No respiratory distress.  Neurological:     Mental Status: He is alert.     AIMS:   ,  ,  ,  ,      Psychiatric Specialty Exam: Presentation  General Appearance: Appropriate for Environment; Casual; Fairly Groomed  Eye Contact: Good  Speech: Clear and Coherent; Normal Rate  Volume: Normal  Handedness:Right   Mood and Affect  Mood: Anxious  Affect: Appropriate; Congruent; Full Range   Thought Process  Thought Process: Coherent; Goal Directed; Linear  Descriptions of Associations: Intact   Thought Content Suicidal Thoughts:No, denied SI  Homicidal Thoughts:No, denied HI  Hallucinations:denied Ideas of Reference:None  Thought Content:Rumination; Perseveration   Sensorium  Memory:Immediate  Good  Judgment:Impaired  Insight:Shallow   Executive Functions  Orientation:Full (Time, Place and Person)  Language:Good  Concentration:Good  Attention:Good  [place] of Knowledge:Good   Psychomotor Activity  Psychomotor Activity:Normal   Assets  Assets:Communication Skills; Desire for Improvement; Resilience   Sleep  Quality:Good  Documented sleep last 24 hours: 7.75   ASSESSMENT/PLAN: Diagnoses / Active Problems: Principal Problem:   MDD (major depressive disorder), recurrent severe, without psychosis ([name]) Active Problems:   MDD (major depressive disorder), recurrent episode, severe ([facility])   Severe benzodiazepine use disorder ([place])   Safety and Monitoring: Voluntary admission to inpatient psychiatric unit for safety, stabilization and treatment   Daily contact with patient to assess and evaluate symptoms and progress in treatment Patient's case to be discussed in multi-disciplinary team meeting Observation Level: q15 minute checks  Vital signs: q12 hours Precautions: suicide, elopement, and assault  2. Psychiatric Diagnoses and Treatment:  MDD v SIMD  IED Less labile, tolerating bad news better.  Still having verbal outbursts, although better than when he first came in.  Reported residual anxiety, that he wants scheduled Vistaril for, patient declined increasing schedule Seroquel.  Considering BuSpar. Continued wellbutrin 150 mg daily for depression Continued gabapentin 600 mg TID for mood lability - changed PM time to 6PM Continued Seroquel 100 mg qAM + 100 mg qNoon + 200 mg qPM - mood lability  STARTED BuSpar 10 mg 3 times daily   3. Medical Issues Being Addressed:  HTN, improving Continued Avapro 75 mg daily medicine, appreciate assistance and recommendation  Mild Hypertriglyceridemia Recommended diet management  Back pain  NSAID use Back pain well controlled with motrin, given constant use, will start ppi as gastritis ppx Continued  motrin 400 mg q6hrs  Continued protonix 20 mg daily  4. Routine and other pertinent labs:   BMI:25.05  CMP WNL  CBC WNL BAL <10 A1c 5.2 (04/25/2022)  Lipid Panel: Chol 201, triglyceride 207, VLDL 41, LDL 104, otherwise WNL (04/02/2022) TSH 1.700 (04/02/2022)  EKG NSR, Qtc 402  4. Discharge Planning:  Social work and case management to assist with discharge planning and identification of hospital follow-up needs prior to discharge Estimated LOS: 5-7 days Discharge Concerns: Need to establish a safety plan; Medication compliance and effectiveness Discharge Goals: Return home with outpatient referrals for mental health follow-up including medication management/psychotherapy  [facility]po: [facility] [place] Date: 06/14/2022  Treatment Plan Summary: Daily contact with patient to assess and evaluate symptoms and progress in treatment and Medication management  Total Time spent with patient: See attending attestation Patient's case was discussed with Attending Dian Situ, MD.   Signed: Merrily Brittle, DO Psychiatry Resident, PGY-2 [facility]  [address] [place], [place] [address] Ph: [phone] Fax: [phone] 06/12/2022, 12:35 PM

## 2022-06-12 NOTE — Group Note (Unsigned)
Date:  06/12/2022 Time:  11:19 PM  Group Topic/Focus:  Wrap-Up Group:   The focus of this group is to help patients review their daily goal of treatment and discuss progress on daily workbooks.     Participation Level:  {BHH PARTICIPATION WO:[id number]  Participation Quality:  {BHH PARTICIPATION QUALITY:22265}  Affect:  {BHH AFFECT:22266}  Cognitive:  {BHH COGNITIVE:22267}  Insight: {BHH Insight2:20797}  Engagement in Group:  {BHH ENGAGEMENT IN BP:[id number]  Modes of Intervention:  {BHH MODES OF INTERVENTION:22269}  Additional Comments:  ***  Debe Coder 06/12/2022, 11:19 PM

## 2022-06-12 NOTE — Group Note (Signed)
Date:  06/12/2022 Time:  1:51 PM  Group Topic/Focus:  Orientation:   The focus of this group is to educate the patient on the purpose and policies of crisis stabilization and provide a format to answer questions about their admission.  The group details unit policies and expectations of patients while admitted.    Participation Level:  Active  Participation Quality:  Appropriate  Affect:  Appropriate  Cognitive:  Appropriate  Insight: Good  Engagement in Group:  Engaged  Modes of Intervention:  Discussion  Additional Comments:     Jerrye Beavers 06/12/2022, 1:51 PM

## 2022-06-12 NOTE — Group Note (Signed)
Date:  06/12/2022 Time:  11:54 PM  Group Topic/Focus:  Wrap-Up Group:   The focus of this group is to help patients review their daily goal of treatment and discuss progress on daily workbooks.    Participation Level:  Active  Participation Quality:  Appropriate  Affect:  Appropriate  Cognitive:  Appropriate  Insight: Appropriate  Engagement in Group:  Engaged  Modes of Intervention:  Education and Exploration  Additional Comments: Patient attended and participated in group tonight.  Salley Scarlet [facility] 06/12/2022, 11:54 PM

## 2022-06-12 NOTE — BHH Group Notes (Signed)
Adult Psychoeducational Group  Date:  06/05/2022 Time: 1300-1400  Group Topic/Focus: Continuation of the group from Saturday. Looking at the lists that were created and talking about what needs to be done with the homework of 30 positives about themselves.                                     Talking about taking their power back and helping themselves to develop a positive self esteem.      Participation Quality:  Appropriate  Affect:  Appropriate  Cognitive:  Oriented  Insight: Improving  Engagement in Group:  Engaged  Modes of Intervention:  Activity, Discussion, Education, and Support  Additional Comments:  Rates energy at a 7/10. Participated fully in the group.  Paulino Rily

## 2022-06-12 NOTE — Progress Notes (Addendum)
D. Pt has been visible in the milieu, appropriate on the unit- attending groups. Per pt's self inventory, pt rated her depression,hopelessness and anxiety a 4/4/6, respectively. Pt's stated goal was, " to smile more and enjoy other's company." Pt currently denies SI/HI and AVH  A. Labs and vitals monitored. Pt given and educated on medications. Pt supported emotionally and encouraged to express concerns and ask questions.   R. Pt remains safe with 15 minute checks. Will continue POC.    06/12/22 1800  Psych Admission Type (Psych Patients Only)  Admission Status Voluntary  Psychosocial Assessment  Patient Complaints Anxiety  Eye Contact Fair  Facial Expression Anxious  Affect Appropriate to circumstance  Speech Logical/coherent  Interaction Assertive  Motor Activity Other (Comment) (WDL)  Appearance/Hygiene Unremarkable  Behavior Characteristics Appropriate to situation  Mood Anxious  Thought Process  Coherency WDL  Content WDL  Delusions None reported or observed  Perception WDL  Hallucination None reported or observed  Judgment Poor  Confusion None  Danger to Self  Current suicidal ideation? Denies  Self-Injurious Behavior No self-injurious ideation or behavior indicators observed or expressed   Agreement Not to Harm Self Yes  Description of Agreement verbal

## 2022-06-12 NOTE — BHH Group Notes (Signed)
Adult Psychoeducational Group Note Date:  06/12/2022 Time:  0900-1000 Group Topic/Focus: PROGRESSIVE RELAXATION. A group where deep breathing is taught and tensing and relaxation muscle groups is used. Imagery is used as well.  Pts are asked to imagine 3 pillars that hold them up when they are not able to hold themselves up and to share that with the group.   Participation Level:did not attend   : Theodore Carr

## 2022-06-12 NOTE — Group Note (Unsigned)
Date:  06/12/2022 Time:  11:15 PM  Group Topic/Focus:  Wrap-Up Group:   The focus of this group is to help patients review their daily goal of treatment and discuss progress on daily workbooks.     Participation Level:  {BHH PARTICIPATION HD:[id number]  Participation Quality:  {BHH PARTICIPATION QUALITY:22265}  Affect:  {BHH AFFECT:22266}  Cognitive:  {BHH COGNITIVE:22267}  Insight: {BHH Insight2:20797}  Engagement in Group:  {BHH ENGAGEMENT IN JY:[id number]  Modes of Intervention:  {BHH MODES OF INTERVENTION:22269}  Additional Comments:  ***  Debe Coder 06/12/2022, 11:15 PM

## 2022-06-12 NOTE — Group Note (Unsigned)
Date:  06/12/2022 Time:  11:29 PM  Group Topic/Focus:  Wrap-Up Group:   The focus of this group is to help patients review their daily goal of treatment and discuss progress on daily workbooks.     Participation Level:  {BHH PARTICIPATION HD:996081  Participation Quality:  {BHH PARTICIPATION QUALITY:22265}  Affect:  {BHH AFFECT:22266}  Cognitive:  {BHH COGNITIVE:22267}  Insight: {BHH Insight2:20797}  Engagement in Group:  {BHH ENGAGEMENT IN JY:[id number]  Modes of Intervention:  {BHH MODES OF INTERVENTION:22269}  Additional Comments:  ***  Debe Coder 06/12/2022, 11:29 PM

## 2022-06-12 NOTE — Progress Notes (Signed)
   06/12/22 2233  Psych Admission Type (Psych Patients Only)  Admission Status Voluntary  Psychosocial Assessment  Patient Complaints Anxiety  Eye Contact Fair  Facial Expression Anxious  Affect Appropriate to circumstance  Speech Logical/coherent  Interaction Assertive  Motor Activity Other (Comment) (WDL)  Appearance/Hygiene Unremarkable  Behavior Characteristics Appropriate to situation  Mood Labile  Thought Process  Coherency WDL  Content WDL  Delusions None reported or observed  Perception WDL  Hallucination None reported or observed  Judgment Poor  Confusion None  Danger to Self  Current suicidal ideation? Denies  Self-Injurious Behavior No self-injurious ideation or behavior indicators observed or expressed   Agreement Not to Harm Self Yes  Description of Agreement verbal  Danger to Others  Danger to Others None reported or observed  Danger to Others Abnormal  Harmful Behavior to others No threats or harm toward other people  Destructive Behavior No threats or harm toward property

## 2022-06-12 NOTE — Group Note (Unsigned)
Date:  06/12/2022 Time:  11:08 PM  Group Topic/Focus:  Wrap-Up Group:   The focus of this group is to help patients review their daily goal of treatment and discuss progress on daily workbooks.     Participation Level:  {BHH PARTICIPATION WO:[id number]  Participation Quality:  {BHH PARTICIPATION QUALITY:22265}  Affect:  {BHH AFFECT:22266}  Cognitive:  {BHH COGNITIVE:22267}  Insight: {BHH Insight2:20797}  Engagement in Group:  {BHH ENGAGEMENT IN BP:8198245  Modes of Intervention:  {BHH MODES OF INTERVENTION:22269}  Additional Comments:  ***  Debe Coder 06/12/2022, 11:08 PM

## 2022-06-13 ENCOUNTER — Encounter (HOSPITAL_COMMUNITY): Payer: Self-pay

## 2022-06-13 MED ORDER — IRBESARTAN 75 MG PO TABS
75.0000 mg | ORAL_TABLET | Freq: Every day | ORAL | Status: DC
  Administered 2022-06-14: 75 mg via ORAL
  Filled 2022-06-13: qty 1
  Filled 2022-06-13: qty 7

## 2022-06-13 MED ORDER — BUPROPION HCL ER (XL) 150 MG PO TB24
150.0000 mg | ORAL_TABLET | Freq: Every day | ORAL | Status: DC
  Administered 2022-06-14: 150 mg via ORAL
  Filled 2022-06-13 (×2): qty 7
  Filled 2022-06-13: qty 1

## 2022-06-13 MED ORDER — QUETIAPINE FUMARATE 200 MG PO TABS
200.0000 mg | ORAL_TABLET | Freq: Every day | ORAL | Status: DC
  Administered 2022-06-13 – 2022-06-15 (×3): 200 mg via ORAL
  Filled 2022-06-13 (×4): qty 1

## 2022-06-13 MED ORDER — BUSPIRONE HCL 5 MG PO TABS
20.0000 mg | ORAL_TABLET | Freq: Three times a day (TID) | ORAL | Status: DC
  Administered 2022-06-13 – 2022-06-16 (×9): 20 mg via ORAL
  Filled 2022-06-13: qty 42
  Filled 2022-06-13: qty 2
  Filled 2022-06-13: qty 42
  Filled 2022-06-13: qty 2
  Filled 2022-06-13: qty 4
  Filled 2022-06-13: qty 42
  Filled 2022-06-13: qty 2
  Filled 2022-06-13: qty 42
  Filled 2022-06-13: qty 4
  Filled 2022-06-13 (×2): qty 42
  Filled 2022-06-13 (×4): qty 4

## 2022-06-13 MED ORDER — PANTOPRAZOLE SODIUM 20 MG PO TBEC
20.0000 mg | DELAYED_RELEASE_TABLET | ORAL | Status: DC
  Administered 2022-06-14 – 2022-06-16 (×3): 20 mg via ORAL
  Filled 2022-06-13 (×3): qty 7
  Filled 2022-06-13: qty 1

## 2022-06-13 MED ORDER — METHOCARBAMOL 500 MG PO TABS
500.0000 mg | ORAL_TABLET | Freq: Once | ORAL | Status: AC
  Administered 2022-06-13: 500 mg via ORAL
  Filled 2022-06-13 (×2): qty 1

## 2022-06-13 MED ORDER — QUETIAPINE FUMARATE 100 MG PO TABS
100.0000 mg | ORAL_TABLET | ORAL | Status: DC
  Administered 2022-06-13 – 2022-06-16 (×6): 100 mg via ORAL
  Filled 2022-06-13 (×4): qty 56
  Filled 2022-06-13: qty 1
  Filled 2022-06-13: qty 56
  Filled 2022-06-13: qty 1
  Filled 2022-06-13: qty 56

## 2022-06-13 MED ORDER — GABAPENTIN 300 MG PO CAPS
600.0000 mg | ORAL_CAPSULE | Freq: Three times a day (TID) | ORAL | Status: DC
  Administered 2022-06-13 – 2022-06-16 (×9): 600 mg via ORAL
  Filled 2022-06-13: qty 42
  Filled 2022-06-13 (×2): qty 2
  Filled 2022-06-13 (×5): qty 42
  Filled 2022-06-13: qty 2
  Filled 2022-06-13 (×3): qty 42

## 2022-06-13 NOTE — BHH Group Notes (Signed)
Pt attended A/A

## 2022-06-13 NOTE — Plan of Care (Signed)
  Problem: Education: Goal: Ability to state activities that reduce stress will improve Outcome: Progressing   Problem: Education: Goal: Utilization of techniques to improve thought processes will improve Outcome: Progressing Goal: Knowledge of the prescribed therapeutic regimen will improve Outcome: Progressing   Problem: Education: Goal: Ability to make informed decisions regarding treatment will improve Outcome: Progressing

## 2022-06-13 NOTE — Plan of Care (Signed)
  Problem: Education: Goal: Ability to state activities that reduce stress will improve Outcome: Progressing   Problem: Education: Goal: Utilization of techniques to improve thought processes will improve Outcome: Progressing   Problem: Education: Goal: Ability to make informed decisions regarding treatment will improve Outcome: Progressing

## 2022-06-13 NOTE — Group Note (Signed)
Recreation Therapy Group Note   Group Topic:Stress Management  Group Date: 06/13/2022 Start Time: 0930 End Time: 0950 Facilitators: Per Beagley-McCall, LRT,CTRS Location: [address] Dayroom   Goal Area(s) Addresses:  Patient will identify positive stress management techniques. Patient will identify benefits of using stress management post d/c.  Group Description:  Meditation.  LRT and patients discussed meditation and what it in tales.  LRT then explained to patients the group setting.  LRT played a meditation that focused on starting the day motivated and confident.  Patients were to listen and follow along as meditation played to fully engage in the process.   Affect/Mood: N/A   Participation Level: Did not attend    Clinical Observations/Individualized Feedback:     Plan: Continue to engage patient in RT group sessions 2-3x/week.   Theodore Carr, LRT,CTRS 06/13/2022 12:46 PM

## 2022-06-13 NOTE — BHH Counselor (Signed)
BHH/BMU LCSW Progress Note   06/13/2022    10:50 AM  [name]      Type of Note: Discharge Planning    CSW spoke with patient this morning regarding our discussion on Sunday about [facility] in [place], [place]. Patient asked on Sunday that if he called [facility] and they have a bed can he be DC Monday. CSW informed patient that [facility] Pester will be called on Monday to hold off. CSW informed doctor of patient planned this morning in progression. CSW went back to see patient and told him that [facility] will be the last option if he is not accepted to the [facility] or [facility].  Patient was fine with plan and was informed that Chinita Pester was called this morning and Kiara the admission coordinator said that she will call CSW back once she get with her nurse today about referrals. CSW will continue to monitor .    Signed:   Silas Flood, MSW, [facility] 06/13/2022 10:50 AM

## 2022-06-13 NOTE — Progress Notes (Signed)
   06/13/22 0844  Psych Admission Type (Psych Patients Only)  Admission Status Voluntary  Psychosocial Assessment  Patient Complaints Anxiety  Eye Contact Fair  Facial Expression Anxious  Affect Appropriate to circumstance  Speech Logical/coherent  Interaction Assertive  Motor Activity Other (Comment) (WNL)  Appearance/Hygiene Unremarkable  Behavior Characteristics Appropriate to situation  Mood Pleasant;Anxious  Thought Process  Coherency WDL  Content WDL  Delusions None reported or observed  Perception WDL  Hallucination None reported or observed  Judgment Poor  Confusion None  Danger to Self  Current suicidal ideation? Denies  Self-Injurious Behavior No self-injurious ideation or behavior indicators observed or expressed   Agreement Not to Harm Self Yes  Description of Agreement verbal  Danger to Others  Danger to Others None reported or observed  Danger to Others Abnormal  Harmful Behavior to others No threats or harm toward other people  Destructive Behavior No threats or harm toward property

## 2022-06-13 NOTE — Progress Notes (Signed)
Patient verbally threatened staff that he would call code if he does not get his medications immediately. Patient reports "I'm agitated and very anxious. Patient rated his anxiety a 7/10, depression 6/10, and hopelessness 2/10. Patient complaints of lower back pain a 6/10 and requested for Ibuprofen. Patient received his scheduled bedtime medications along with PRN hydroxyzine, trazodone and Ibuprofen respectively at [facility]. Patient denies SI, HI, & AVH. Q 15 minutes safety checks in place. Plan of care ongoing.

## 2022-06-13 NOTE — BH IP Treatment Plan (Signed)
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[place])   Current Medications:  Current Facility-Administered Medications  Medication Dose Route Frequency Provider Last Rate Last Admin   alum & mag hydroxide-simeth (MAALOX/MYLANTA) 200-200-20 MG/5ML suspension 30 mL  30 mL Oral Q4H PRN Revonda Humphrey, NP       [START ON 06/14/2022] buPROPion (WELLBUTRIN XL) 24 hr tablet 150 mg  150 mg Oral Daily Merrily Brittle, DO       busPIRone (BUSPAR) tablet 20 mg  20 mg Oral TID Merrily Brittle, DO   20 mg at 06/13/22 1154   gabapentin (NEURONTIN) capsule 600 mg  600 mg Oral TID Merrily Brittle, DO   600 mg at 06/13/22 1154   hydrOXYzine (ATARAX) tablet 50 mg  50 mg Oral Q6H PRN Merrily Brittle, DO   50 mg at 06/13/22 1250   ibuprofen (ADVIL) tablet 600 mg  600 mg Oral Q6H PRN Dian Situ, MD   600 mg at 06/13/22 1250   [START ON 06/14/2022] irbesartan (AVAPRO) tablet 75 mg  75 mg Oral Daily Merrily Brittle, DO       magnesium hydroxide (MILK OF MAGNESIA) suspension 30 mL  30 mL Oral Daily PRN Revonda Humphrey, NP       OLANZapine zydis (ZYPREXA) disintegrating tablet 10 mg  10 mg Oral Q8H PRN Merrily Brittle, DO   10 mg at 06/12/22 1628   And   ziprasidone (GEODON) injection 20 mg  20 mg Intramuscular PRN Merrily Brittle, DO       [START ON 06/14/2022] pantoprazole (PROTONIX) EC tablet 20 mg  20 mg Oral Aldean Baker, Almyra Free, DO       QUEtiapine (SEROQUEL) tablet 100 mg  100 mg Oral [id number] Merrily Brittle, DO   100 mg at 06/13/22 1153   And   QUEtiapine (SEROQUEL) tablet 200 mg  200 mg Oral QHS Merrily Brittle, DO       traZODone (DESYREL) tablet 50 mg  50 mg Oral QHS PRN Revonda Humphrey, NP   50 mg at 06/12/22 2118   PTA Medications: Medications Prior to Admission  Medication Sig Dispense Refill Last Dose   buPROPion (WELLBUTRIN XL) 150 MG 24 hr tablet Take 1 tablet (150 mg total) by mouth daily. 30 tablet 0    buPROPion (WELLBUTRIN XL) 150 MG 24 hr tablet Take 1 tablet (150 mg total) by mouth daily.      gabapentin (NEURONTIN) 300 MG capsule Take 2 capsules (600 mg total) by mouth 2 (two) times daily.      gabapentin (NEURONTIN) 600 MG tablet Take 1 tablet (600 mg total) by mouth 2 (two) times daily. 60 tablet 0     Patient Stressors: Financial difficulties   Medication change or noncompliance   Traumatic event    Patient Strengths: Average or above average intelligence  Capable of independent living  Communication skills  General fund of knowledge  Motivation for treatment/growth  Physical Health   Treatment Modalities: Medication Management, Group therapy, Case management,  1 to 1 session with clinician, Psychoeducation, Recreational therapy.   Physician Treatment Plan for Primary Diagnosis: MDD (major depressive disorder), recurrent severe, without psychosis ([place]) Long Term Goal(s):  Short Term Goals:    Medication Management: Evaluate patient's response, side effects, and tolerance of medication regimen.  Therapeutic Interventions: 1 to 1 sessions, Unit Group sessions and Medication administration.  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  Therapeutic Interventions: 1 to 1 sessions, Unit Group sessions and Medication administration.  Evaluation of Outcomes: Progressing   RN Treatment Plan for Primary Diagnosis: MDD (major depressive disorder), recurrent severe, without psychosis ([place]) Long Term Goal(s): Knowledge of disease and therapeutic regimen to maintain health will improve  Short Term Goals: Ability to remain free from injury will improve, Ability to verbalize frustration and anger appropriately will improve, Ability to demonstrate self-control, Ability to participate in decision making will improve, Ability to verbalize feelings will improve, Ability to disclose and discuss suicidal ideas, Ability to identify and develop effective coping behaviors will improve, and Compliance with prescribed medications will improve  Medication Management: RN will administer medications as ordered by provider, will assess and evaluate patient's response and provide education to patient for prescribed medication. RN will report any adverse and/or side effects to prescribing provider.  Therapeutic Interventions: 1 on 1 counseling sessions, Psychoeducation, Medication administration, Evaluate responses to treatment, Monitor vital signs and CBGs as ordered, Perform/monitor CIWA, COWS, AIMS and Fall Risk screenings as ordered, Perform wound care treatments as ordered.  Evaluation of Outcomes: Progressing   LCSW Treatment Plan for Primary Diagnosis: MDD (major depressive disorder), recurrent severe, without psychosis ([place]) Long Term Goal(s): Safe transition to appropriate next level of care at discharge, Engage patient in therapeutic group addressing interpersonal concerns.  Short Term Goals: Engage patient in aftercare planning with referrals and resources, Increase social support, Increase ability to appropriately verbalize feelings, Increase emotional regulation, Facilitate acceptance of mental health diagnosis and concerns,  Facilitate patient progression through stages of change regarding substance use diagnoses and concerns, Identify triggers associated with mental health/substance abuse issues, and Increase skills for wellness and recovery  Therapeutic Interventions: Assess for all discharge needs, 1 to 1 time with Social worker, Explore available resources and support systems, Assess for adequacy in community support network, Educate family and significant other(s) on suicide prevention, Complete Psychosocial Assessment, Interpersonal group therapy.  Evaluation of Outcomes: Progressing   Progress in Treatment: Attending groups: Yes. Participating in groups: Yes. Taking medication as prescribed: Yes. Toleration medication: Yes. Family/Significant other contact made: Yes, individual(s) contacted:  -[place] C [phone] or [phone] Patient understands diagnosis: Yes. Discussing patient identified problems/goals with staff: No. Medical problems stabilized or resolved: Yes. Denies suicidal/homicidal ideation: Yes. Issues/concerns per patient self-inventory: No.   New problem(s) identified: No, Describe:  none reported   New Short Term/Long Term Goal(s):   medication stabilization, elimination of SI thoughts, development of comprehensive mental wellness plan.    Patient Goals:  Pt working on initial tx goals.  Discharge Plan or Barriers: patient has therapy and med management.   Reason for Continuation of Hospitalization: Anxiety Depression Medication stabilization  Estimated Length of Stay: 1-3 days  Last 3 [place] Suicide Severity Risk Score: Flowsheet Row Admission (Current) from 06/06/2022 in [facility] Most recent  reading at 06/06/2022  6:41 PM ED from 06/06/2022 in [facility] Most recent reading at 06/06/2022 12:24 PM Admission (Discharged) from 04/20/2022 in [facility] Most  recent reading at 04/20/2022 10:28 PM  C-SSRS RISK CATEGORY High Risk Low Risk High Risk       Last PHQ 2/9 Scores:    04/02/2022    6:33 PM 02/11/2015   10:00 AM  Depression screen PHQ 2/9  Decreased Interest 1 0  Down, Depressed, Hopeless 2 1  PHQ - 2 Score 3 1  Altered sleeping 1   Tired, decreased energy 1   Change in appetite 1   Feeling bad or failure about yourself  2   Trouble concentrating 1   Moving slowly or fidgety/restless 0   Suicidal thoughts 2   PHQ-9 Score 11   Difficult doing work/chores Very difficult     Scribe for Treatment Team: Zachery Conch, LCSW 06/13/2022 3:21 PM

## 2022-06-13 NOTE — Progress Notes (Signed)
Patient ID: Theodore Carr, male   DOB: 15-Jun-1985, 37 y.o.   MRN: [id number]   Pt received his scheduled 1700 meds. Pt requested Zyprexa because "I'm getting really anxious and agitated." When RN asked what is making pt agitated and anxious, pt stated, "I don't know. I'm getting agitated and I'm starting to feel like I want to leave. I got mad at a nurse last night and trying to 'catch it' so I can control it."  Zyprexa 10 mg PRN given.

## 2022-06-13 NOTE — Progress Notes (Signed)
[facility] MD Progress Note  Date: 06/13/2022 12:09 PM Name: Theodore Carr  DOB: Nov 07, 1985  MRN:  [id number] Unit: [id number]/[id number]  CC: active HI/SI  Theodore Carr is a 37 y.o. 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  At [facility] 1/3-03/2023 Total duration of encounter: 7 days   Last 24h: Per RN, NAEON Documented sleep last 24 hours: 8.25   Subjective:  On assessment today, patient was initially seen in the day room, no acute distress. Patient was pleasant, calm, cooperative during evaluation.   Still anxious, didn't notice difference with starting BuSpar.  Patient amenable to increasing dose.  Patient amenable with EKG for elevated heart rate.  Reported last night he received Zyprexa, for becoming upset that group was canceled.  Requested that his medication be changed to 6 AM, 12 PM, 6 PM.  Except for Seroquel, which he wants at 8 PM.   Mood: Anxious Sleep:Good Appetite: Good  Suicidal Thoughts: No (-)  Denied HI Hallucinations: None  Review of Systems  Constitutional:  Negative for diaphoresis and malaise/fatigue.  Eyes:  Negative for blurred vision and double vision.  Respiratory:  Negative for shortness of breath.   Cardiovascular:  Negative for chest pain and palpitations.  Gastrointestinal:  Negative for abdominal pain, constipation, diarrhea, heartburn, nausea and vomiting.  Musculoskeletal:  Positive for back pain.  Neurological:  Negative for dizziness, tremors, weakness and headaches.    Principal Problem: MDD (major depressive disorder), recurrent severe, without psychosis  ([place]) Diagnosis: Principal Problem:   MDD (major depressive disorder), recurrent severe, without psychosis ([place]) Active Problems:   MDD (major depressive disorder), recurrent episode, severe ([place])   Severe benzodiazepine use disorder ([place])  Past Psychiatric History: See H&P Past Medical History:  Past Medical History:  Diagnosis Date   Compression fracture of T12 vertebra ([place]) 2008   Depression    Hypertension    Insomnia 04/21/2022    History reviewed. No pertinent surgical history. Family History:  Family History  Problem Relation Age of Onset   Drug abuse Cousin    Family Psychiatric History: See H&P Social History:  Social History   Substance and Sexual Activity  Alcohol Use Not Currently   Alcohol/week: 2.0 standard drinks of alcohol   Types: 2 Cans of beer per week   Comment: last used 4 days ago in prison     Social History   Substance and Sexual Activity  Drug Use Not Currently   Frequency: 2.0 times per week   Types: Cocaine   Comment: last used 4 days ago    Social History   Socioeconomic History   Marital status: Single    Spouse name: Not on file   Number of children: Not on file   Years of education: 12   Highest education level: GED or equivalent  Occupational History   Not on file  Tobacco Use   Smoking status: Former    Packs/day: 1.00    Years: 15.00    Total pack years: 15.00    Types: Cigarettes    Quit date: 03/18/2022    Years since quitting: 0.2   Smokeless tobacco: Former  Tobacco comments:    Refused nicotine patch and nicotine gum  Vaping Use   Vaping Use: Never used  Substance and Sexual Activity   Alcohol use: Not Currently    Alcohol/week: 2.0 standard drinks of alcohol    Types: 2 Cans of beer per week    Comment: last used 4 days ago in prison   Drug use: Not Currently    Frequency: 2.0 times per week    Types: Cocaine    Comment: last used 4 days ago   Sexual activity: Yes  Other Topics Concern   Not on file   Social History Narrative   Not on file   Social Determinants of Health   Financial Resource Strain: Not on file  Food Insecurity: No Food Insecurity (06/06/2022)   Hunger Vital Sign    Worried About Running Out of Food in the Last Year: Never true    Ran Out of Food in the Last Year: Never true  Transportation Needs: Unknown (06/06/2022)   [place] - Hydrologist (Medical): Patient refused    Lack of Transportation (Non-Medical): Patient refused  Physical Activity: Not on file  Stress: Not on file  Social Connections: Not on file   Additional Social History:                         Current Medications: Current Facility-Administered Medications  Medication Dose Route Frequency Provider Last Rate Last Admin   alum & mag hydroxide-simeth (MAALOX/MYLANTA) 200-200-20 MG/5ML suspension 30 mL  30 mL Oral Q4H PRN Revonda Humphrey, NP       [START ON 06/14/2022] buPROPion (WELLBUTRIN XL) 24 hr tablet 150 mg  150 mg Oral Daily Merrily Brittle, DO       busPIRone (BUSPAR) tablet 20 mg  20 mg Oral TID Merrily Brittle, DO   20 mg at 06/13/22 1154   gabapentin (NEURONTIN) capsule 600 mg  600 mg Oral TID Merrily Brittle, DO   600 mg at 06/13/22 1154   hydrOXYzine (ATARAX) tablet 50 mg  50 mg Oral Q6H PRN Merrily Brittle, DO   50 mg at 06/13/22 0604   ibuprofen (ADVIL) tablet 600 mg  600 mg Oral Q6H PRN Dian Situ, MD   600 mg at 06/13/22 0604   [START ON 06/14/2022] irbesartan (AVAPRO) tablet 75 mg  75 mg Oral Daily Merrily Brittle, DO       magnesium hydroxide (MILK OF MAGNESIA) suspension 30 mL  30 mL Oral Daily PRN Revonda Humphrey, NP       OLANZapine zydis (ZYPREXA) disintegrating tablet 10 mg  10 mg Oral Q8H PRN Merrily Brittle, DO   10 mg at 06/12/22 1628   And   ziprasidone (GEODON) injection 20 mg  20 mg Intramuscular PRN Merrily Brittle, DO       [START ON 06/14/2022] pantoprazole (PROTONIX) EC tablet 20 mg  20 mg Oral Aldean Baker, Almyra Free, DO        QUEtiapine (SEROQUEL) tablet 100 mg  100 mg Oral [id number] Merrily Brittle, DO   100 mg at 06/13/22 1153   And   QUEtiapine (SEROQUEL) tablet 200 mg  200 mg Oral QHS Merrily Brittle, DO       traZODone (DESYREL) tablet 50 mg  50 mg Oral QHS PRN Revonda Humphrey, NP   50 mg at 06/12/22 2118    Lab Results:  No results found for this or any previous visit (from the past  48 hour(s)).   Blood Alcohol level:  Lab Results  Component Value Date   ETH <10 06/06/2022   ETH <10 [id number]    Metabolic Disorder Labs: Lab Results  Component Value Date   HGBA1C 5.2 04/25/2022   MPG 103 04/25/2022   MPG 105 04/02/2022   Lab Results  Component Value Date   PROLACTIN 28.5 (H) 08/31/2016   Lab Results  Component Value Date   CHOL 201 (H) 04/02/2022   TRIG 207 (H) 04/02/2022   HDL 56 04/02/2022   CHOLHDL 3.6 04/02/2022   VLDL 41 (H) 04/02/2022   LDLCALC 104 (H) 04/02/2022   LDLCALC 76 08/31/2016    Physical Findings: BP 129/81   Pulse (!) 112   Temp 98.1 F (36.7 C) (Oral)   Resp 18   Ht '5\' 7"'$  (1.702 m)   Wt 72.6 kg   SpO2 97%   BMI 25.06 kg/m   Physical Exam Physical Exam Vitals and nursing note reviewed.  Constitutional:      General: He is not in acute distress.    Appearance: He is not ill-appearing, toxic-appearing or diaphoretic.  HENT:     Head: Normocephalic.  Pulmonary:     Effort: Pulmonary effort is normal. No respiratory distress.  Neurological:     Mental Status: He is alert.     AIMS:   ,  ,  ,  ,      Psychiatric Specialty Exam: Presentation  General Appearance: Appropriate for Environment; Casual; Fairly Groomed  Eye Contact: Good  Speech: Clear and Coherent; Normal Rate  Volume: Normal  Handedness:Right   Mood and Affect  Mood: Anxious  Affect: Appropriate; Congruent; Full Range   Thought Process  Thought Process: Coherent; Goal Directed; Linear  Descriptions of Associations: Intact   Thought Content Suicidal Thoughts:No,  denied SI  Homicidal Thoughts:No, denied HI  Hallucinations:denied Ideas of Reference:None  Thought Content:Rumination; Perseveration   Sensorium  Memory:Immediate Good  Judgment:Impaired  Insight:Shallow   Executive Functions  Orientation:Full (Time, Place and Person)  Language:Good  Concentration:Good  Attention:Good  Morrison of Knowledge:Good   Psychomotor Activity  Psychomotor Activity:Normal   Assets  Assets:Communication Skills; Desire for Improvement; Resilience   Sleep  Quality:Good  Documented sleep last 24 hours: 8.25   ASSESSMENT/PLAN: Diagnoses / Active Problems: Principal Problem:   MDD (major depressive disorder), recurrent severe, without psychosis ([place]) Active Problems:   MDD (major depressive disorder), recurrent episode, severe ([facility])   Severe benzodiazepine use disorder ([place])   Safety and Monitoring: Voluntary admission to inpatient psychiatric unit for safety, stabilization and treatment   Daily contact with patient to assess and evaluate symptoms and progress in treatment Patient's case to be discussed in multi-disciplinary team meeting Observation Level: q15 minute checks  Vital signs: q12 hours Precautions: suicide, elopement, and assault  2. Psychiatric Diagnoses and Treatment:  MDD v SIMD  IED Able to control anger much better, no outburst for days now. Still anxious Continued wellbutrin 150 mg daily for depression Continued gabapentin 600 mg TID for mood lability - changed PM time to 6PM Continued Seroquel 100 mg qAM + 100 mg qNoon + 200 mg qPM - mood lability  INCREASED BuSpar 10 mg to 20 mg 3 times daily   3. Medical Issues Being Addressed:  Tachycardia Unclear etiology, suspect poor p.o. fluid intake.  EKG-ordered  HTN Normotensive Continued Avapro 75 mg daily medicine, appreciate assistance and recommendation  Mild Hypertriglyceridemia Recommended diet management  Back pain  NSAID use Back pain  well controlled with motrin, given constant use, will start ppi as gastritis ppx Continued motrin 400 mg q6hrs  Continued protonix 20 mg daily  4. Routine and other pertinent labs:   BMI:25.05  CMP WNL CBC WNL BAL <10 A1c 5.2 (04/25/2022)  Lipid Panel: Chol 201, triglyceride 207, VLDL 41, LDL 104, otherwise WNL (04/02/2022) TSH 1.700 (04/02/2022)  EKG NSR, Qtc 402  4. Discharge Planning:  Social work and case management to assist with discharge planning and identification of hospital follow-up needs prior to discharge Estimated LOS: 5-7 days Discharge Concerns: Need to establish a safety plan; Medication compliance and effectiveness Discharge Goals: Return home with outpatient referrals for mental health follow-up including medication management/psychotherapy  Dispo: [facility] [place] [facility] Date: 2/27-29/2024  Treatment Plan Summary: Daily contact with patient to assess and evaluate symptoms and progress in treatment and Medication management  Total Time spent with patient: See attending attestation Patient's case was discussed with Attending Dian Situ, MD.   Signed: Merrily Brittle, DO Psychiatry Resident, PGY-2 [facility]  [address] [name], [place] [address] Ph: [phone] Fax: [phone] 06/13/2022, 12:09 PM

## 2022-06-13 NOTE — BHH Group Notes (Signed)
Adult Psychoeducational Group Note  Date:  06/13/2022 Time:  2:54 PM  Group Topic/Focus:  Social wellness  Participation Level:  Active  Participation Quality:  Appropriate  Affect:  Appropriate  Cognitive:  Appropriate  Insight: Appropriate  Engagement in Group:  Engaged  Modes of Intervention:  Role-play  Additional Comments:  Pt enjoyed participating in [place]. Pt has no feelings of wanting to hurt himself or others.  Lyndee Herbst, Georgiann Mccoy 06/13/2022, 2:54 PM

## 2022-06-13 NOTE — BHH Counselor (Signed)
BHH/BMU LCSW Progress Note   06/13/2022    4:23 PM  [name]      Type of Note: [facility] / [facility] program   [place] spoke with [name] and was informed that patient Medicaid is out of [facility] and for him to get into [facility] , he would need to switch his Medicaid to [employer] or drop it.    Also, CSW has reached out to Mr. Wille Glaser the [place] coordinator and left him two voicemail for today about patient referral .  CSW will continue to assist.     Signed:   Silas Flood, MSW, [facility] 06/13/2022 4:23 PM

## 2022-06-13 NOTE — BHH Group Notes (Signed)
Spiritual care group on grief and loss facilitated by Chaplain Janne Napoleon, Bcc and Lysle Morales, counseling intern.  Group Goal: Support / Education around grief and loss  Members engage in facilitated group support and psycho-social education.  Group Description:  Following introductions and group rules, group members engaged in facilitated group dialogue and support around topic of loss, with particular support around experiences of loss in their lives. Group Identified types of loss (relationships / self / things) and identified patterns, circumstances, and changes that precipitate losses. Reflected on thoughts / feelings around loss, normalized grief responses, and recognized variety in grief experience. Group encouraged individual reflection on safe space and on the coping skills that they are already utilizing.  Group drew on Adlerian / Rogerian and narrative framework  Patient Progress: Theodore Carr attended group and actively engaged and participated in group conversation.  He shared about the loss of his mother at the age of 12 and how he became angry with God after she died.  He has been able to shift to a perspective of being grateful for the time that he had with her.  His comments demonstrated good insight and he was supportive of peers.  [address], [place] Pager, [phone]

## 2022-06-14 ENCOUNTER — Other Ambulatory Visit (HOSPITAL_COMMUNITY): Payer: Self-pay

## 2022-06-14 MED ORDER — HYDROXYZINE HCL 50 MG PO TABS
50.0000 mg | ORAL_TABLET | Freq: Four times a day (QID) | ORAL | 0 refills | Status: DC
  Filled 2022-06-14: qty 30, 8d supply, fill #0

## 2022-06-14 MED ORDER — GABAPENTIN 600 MG PO TABS
600.0000 mg | ORAL_TABLET | Freq: Three times a day (TID) | ORAL | 0 refills | Status: DC
  Filled 2022-06-14: qty 90, 30d supply, fill #0

## 2022-06-14 MED ORDER — IRBESARTAN 75 MG PO TABS
75.0000 mg | ORAL_TABLET | Freq: Every day | ORAL | 0 refills | Status: DC
  Filled 2022-06-14: qty 30, 30d supply, fill #0

## 2022-06-14 MED ORDER — BUPROPION HCL ER (XL) 300 MG PO TB24
300.0000 mg | ORAL_TABLET | Freq: Every day | ORAL | Status: DC
  Administered 2022-06-15 – 2022-06-16 (×2): 300 mg via ORAL
  Filled 2022-06-14 (×4): qty 1

## 2022-06-14 MED ORDER — QUETIAPINE FUMARATE 200 MG PO TABS: 200.0000 mg | ORAL_TABLET | Freq: Every day | ORAL | 0 refills | Status: DC

## 2022-06-14 MED ORDER — TRAZODONE HCL 50 MG PO TABS: 50.0000 mg | ORAL_TABLET | Freq: Every evening | ORAL | 0 refills | Status: DC | PRN

## 2022-06-14 MED ORDER — GABAPENTIN 600 MG PO TABS: 600.0000 mg | ORAL_TABLET | Freq: Three times a day (TID) | ORAL | 0 refills | Status: DC

## 2022-06-14 MED ORDER — QUETIAPINE FUMARATE 100 MG PO TABS: 100.0000 mg | ORAL_TABLET | ORAL | 0 refills | Status: DC

## 2022-06-14 MED ORDER — BUSPIRONE HCL 10 MG PO TABS: 20.0000 mg | ORAL_TABLET | Freq: Three times a day (TID) | ORAL | 0 refills | Status: AC

## 2022-06-14 MED ORDER — IRBESARTAN 75 MG PO TABS
75.0000 mg | ORAL_TABLET | Freq: Every day | ORAL | Status: DC
  Administered 2022-06-15: 75 mg via ORAL
  Filled 2022-06-14 (×2): qty 1

## 2022-06-14 MED ORDER — QUETIAPINE FUMARATE 200 MG PO TABS
ORAL_TABLET | ORAL | 0 refills | Status: DC
  Filled 2022-06-14: qty 60, 30d supply, fill #0

## 2022-06-14 MED ORDER — HYDROXYZINE HCL 50 MG PO TABS: 50.0000 mg | ORAL_TABLET | Freq: Four times a day (QID) | ORAL | 0 refills | Status: AC | PRN

## 2022-06-14 MED ORDER — BUPROPION HCL ER (XL) 150 MG PO TB24: 300.0000 mg | ORAL_TABLET | Freq: Every morning | ORAL | 0 refills | Status: DC

## 2022-06-14 MED ORDER — IRBESARTAN 75 MG PO TABS: 75.0000 mg | ORAL_TABLET | Freq: Every day | ORAL | 0 refills | Status: DC

## 2022-06-14 MED ORDER — BUSPIRONE HCL 10 MG PO TABS
20.0000 mg | ORAL_TABLET | Freq: Three times a day (TID) | ORAL | 0 refills | Status: DC
  Filled 2022-06-14: qty 180, 30d supply, fill #0

## 2022-06-14 MED ORDER — IRBESARTAN 150 MG PO TABS: 150.0000 mg | ORAL_TABLET | Freq: Every day | ORAL | Status: DC

## 2022-06-14 MED ORDER — PANTOPRAZOLE SODIUM 20 MG PO TBEC: 20.0000 mg | DELAYED_RELEASE_TABLET | ORAL | 0 refills | Status: DC

## 2022-06-14 MED ORDER — BUPROPION HCL ER (XL) 300 MG PO TB24
300.0000 mg | ORAL_TABLET | Freq: Every day | ORAL | 0 refills | Status: DC
  Filled 2022-06-14: qty 30, 30d supply, fill #0

## 2022-06-14 MED ORDER — CLONIDINE HCL 0.1 MG PO TABS
0.1000 mg | ORAL_TABLET | Freq: Three times a day (TID) | ORAL | Status: DC | PRN
  Administered 2022-06-14 – 2022-06-15 (×3): 0.1 mg via ORAL
  Filled 2022-06-14 (×3): qty 1

## 2022-06-14 MED ORDER — TRAZODONE HCL 50 MG PO TABS
50.0000 mg | ORAL_TABLET | Freq: Every evening | ORAL | 0 refills | Status: DC | PRN
  Filled 2022-06-14: qty 30, 30d supply, fill #0

## 2022-06-14 MED ORDER — BUPROPION HCL ER (XL) 150 MG PO TB24
150.0000 mg | ORAL_TABLET | ORAL | Status: AC
  Administered 2022-06-14: 150 mg via ORAL
  Filled 2022-06-14: qty 1

## 2022-06-14 MED ORDER — PANTOPRAZOLE SODIUM 20 MG PO TBEC
20.0000 mg | DELAYED_RELEASE_TABLET | Freq: Every day | ORAL | 0 refills | Status: DC
  Filled 2022-06-14: qty 30, 30d supply, fill #0

## 2022-06-14 NOTE — Progress Notes (Signed)
   06/14/22 0630  15 Minute Checks  Location Hallway  Visual Appearance Calm  Behavior Composed  Sleep (Behavioral Health Patients Only)  Calculate sleep? (Click Yes once per 24 hr at 0600 safety check) Yes  Documented sleep last 24 hours 6.75

## 2022-06-14 NOTE — Progress Notes (Signed)
   06/14/22 1000  Psych Admission Type (Psych Patients Only)  Admission Status Voluntary  Psychosocial Assessment  Patient Complaints Anxiety  Eye Contact Fair  Facial Expression Animated  Affect Appropriate to circumstance  Speech Logical/coherent  Interaction Assertive  Motor Activity Other (Comment) (WDL)  Appearance/Hygiene Unremarkable  Behavior Characteristics Cooperative  Mood Anxious  Thought Process  Coherency WDL  Content WDL  Delusions None reported or observed  Perception WDL  Hallucination None reported or observed  Judgment WDL  Confusion None  Danger to Self  Current suicidal ideation? Denies  Danger to Others  Danger to Others None reported or observed

## 2022-06-14 NOTE — Group Note (Unsigned)
Date:  06/14/2022 Time:  1:32 PM  Group Topic/Focus:  Goals Group:   The focus of this group is to help patients establish daily goals to achieve during treatment and discuss how the patient can incorporate goal setting into their daily lives to aide in recovery. Orientation:   The focus of this group is to educate the patient on the purpose and policies of crisis stabilization and provide a format to answer questions about their admission.  The group details unit policies and expectations of patients while admitted.     Participation Level:  {BHH PARTICIPATION LEVEL:22264}  Participation Quality:  {BHH PARTICIPATION QUALITY:22265}  Affect:  {BHH AFFECT:22266}  Cognitive:  {BHH COGNITIVE:22267}  Insight: {BHH Insight2:20797}  Engagement in Group:  {BHH ENGAGEMENT IN GROUP:22268}  Modes of Intervention:  {BHH MODES OF INTERVENTION:22269}  Additional Comments:  ***  Auburn Hester Lashawn Lillis Nuttle 06/14/2022, 1:32 PM  

## 2022-06-14 NOTE — Group Note (Signed)
Recreation Therapy Group Note   Group Topic:Animal Assisted Therapy   Group Date: 06/14/2022 Start Time: 1430 End Time: 1500 Facilitators: Marlaysia Lenig-McCall, LRT,CTRS Location: 300 Hall Dayroom   Animal-Assisted Activity (AAA) Program Checklist/Progress Notes Patient Eligibility Criteria Checklist & Daily Group note for Rec Tx Intervention  AAA/T Program Assumption of Risk Form signed by Patient/ or Parent Legal Guardian Yes  Patient is free of allergies or severe asthma Yes  Patient reports no fear of animals Yes  Patient reports no history of cruelty to animals Yes  Patient understands his/her participation is voluntary Yes  Patient washes hands before animal contact Yes  Patient washes hands after animal contact Yes   Affect/Mood: Appropriate   Participation Level: Engaged   Participation Quality: Independent   Behavior: Appropriate    Clinical Observations/Individualized Feedback:  Patient attended session and interacted appropriately with therapy dog and peers.    Plan: Continue to engage patient in RT group sessions 2-3x/week.   Jerelene Salaam-McCall, LRT,CTRS  06/14/2022 3:27 PM

## 2022-06-14 NOTE — Progress Notes (Signed)
[facility] MD Progress Note  Date: 06/14/2022 12:22 PM Name: Theodore Carr  DOB: 05/03/1985  MRN:  [id number] Unit: [id number]/[id number]  CC: active HI/SI  [name] is a 37 y.o. 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  At [facility] 1/3-03/2023 Total duration of encounter: 8 days   Last 24h: Per RN, agitated and anxious, waiting for medication, received agitation PRNs Documented sleep last 24 hours: 6.75   Subjective:  On assessment today, patient was initially seen in the day room, no acute distress. Patient was pleasant, calm, cooperative during evaluation.   Anxiety is improving. Denied rx side effect. Discussed with patient EKG findings about HR. Patient was disappointed about [facility] not accepting patients. He stated that he is still ok with going to [facility] for DC. Requested increasing wellbutrin, he was ok with additional dose today and increasing to next dose tomorrow.   Mood: Anxious Sleep:Good Appetite: Good  Suicidal Thoughts: No (-)  Denied HI Hallucinations: None  Review of Systems  Constitutional:  Negative for diaphoresis and malaise/fatigue.  Eyes:  Negative for blurred vision and double vision.  Respiratory:  Negative for shortness of breath.   Cardiovascular:  Negative for chest pain and palpitations.  Gastrointestinal:  Negative for abdominal pain, constipation, diarrhea, heartburn, nausea and vomiting.  Musculoskeletal:  Positive for back pain.  Neurological:  Negative for dizziness, tremors, weakness and headaches.    Principal Problem: MDD (major depressive  disorder), recurrent severe, without psychosis ([place]) Diagnosis: Principal Problem:   MDD (major depressive disorder), recurrent severe, without psychosis ([place]) Active Problems:   MDD (major depressive disorder), recurrent episode, severe ([place])   Severe benzodiazepine use disorder ([place])  Past Psychiatric History: See H&P Past Medical History:  Past Medical History:  Diagnosis Date   Compression fracture of T12 vertebra ([place]) 2008   Depression    Hypertension    Insomnia 04/21/2022    History reviewed. No pertinent surgical history. Family History:  Family History  Problem Relation Age of Onset   Drug abuse Cousin    Family Psychiatric History: See H&P Social History:  Social History   Substance and Sexual Activity  Alcohol Use Not Currently   Alcohol/week: 2.0 standard drinks of alcohol   Types: 2 Cans of beer per week   Comment: last used 4 days ago in prison     Social History   Substance and Sexual Activity  Drug Use Not Currently   Frequency: 2.0 times per week   Types: Cocaine   Comment: last used 4 days ago    Social History   Socioeconomic History   Marital status: Single    Spouse name: Not on file   Number of children: Not on file   Years of education: 12   Highest education level: GED or equivalent  Occupational History   Not on file  Tobacco Use   Smoking status: Former    Packs/day: 1.00    Years: 15.00    Total pack years: 15.00    Types: Cigarettes    Quit date: 03/18/2022    Years since quitting: 0.2   Smokeless tobacco: Former   Tobacco  comments:    Refused nicotine patch and nicotine gum  Vaping Use   Vaping Use: Never used  Substance and Sexual Activity   Alcohol use: Not Currently    Alcohol/week: 2.0 standard drinks of alcohol    Types: 2 Cans of beer per week    Comment: last used 4 days ago in prison   Drug use: Not Currently    Frequency: 2.0 times per week    Types: Cocaine    Comment: last used 4 days ago   Sexual  activity: Yes  Other Topics Concern   Not on file  Social History Narrative   Not on file   Social Determinants of Health   Financial Resource Strain: Not on file  Food Insecurity: No Food Insecurity (06/06/2022)   Hunger Vital Sign    Worried About Running Out of Food in the Last Year: Never true    Ran Out of Food in the Last Year: Never true  Transportation Needs: Unknown (06/06/2022)   [place] - Hydrologist (Medical): Patient refused    Lack of Transportation (Non-Medical): Patient refused  Physical Activity: Not on file  Stress: Not on file  Social Connections: Not on file   Additional Social History:                         Current Medications: Current Facility-Administered Medications  Medication Dose Route Frequency Provider Last Rate Last Admin   alum & mag hydroxide-simeth (MAALOX/MYLANTA) 200-200-20 MG/5ML suspension 30 mL  30 mL Oral Q4H PRN Revonda Humphrey, NP       buPROPion (WELLBUTRIN XL) 24 hr tablet 150 mg  150 mg Oral NOW Merrily Brittle, DO       [START ON 06/15/2022] buPROPion (WELLBUTRIN XL) 24 hr tablet 300 mg  300 mg Oral Daily Merrily Brittle, DO       busPIRone (BUSPAR) tablet 20 mg  20 mg Oral TID Merrily Brittle, DO   20 mg at 06/14/22 1202   gabapentin (NEURONTIN) capsule 600 mg  600 mg Oral TID Merrily Brittle, DO   600 mg at 06/14/22 1202   hydrOXYzine (ATARAX) tablet 50 mg  50 mg Oral Q6H PRN Merrily Brittle, DO   50 mg at 06/14/22 1000   ibuprofen (ADVIL) tablet 600 mg  600 mg Oral Q6H PRN Dian Situ, MD   600 mg at 06/14/22 0609   irbesartan (AVAPRO) tablet 75 mg  75 mg Oral Daily Merrily Brittle, DO   75 mg at 06/14/22 [id number]   magnesium hydroxide (MILK OF MAGNESIA) suspension 30 mL  30 mL Oral Daily PRN Revonda Humphrey, NP       OLANZapine zydis (ZYPREXA) disintegrating tablet 10 mg  10 mg Oral Q8H PRN Merrily Brittle, DO   10 mg at 06/13/22 1705   And   ziprasidone (GEODON) injection 20 mg  20 mg Intramuscular PRN  Merrily Brittle, DO       pantoprazole (PROTONIX) EC tablet 20 mg  20 mg Oral Aldean Baker, Almyra Free, DO   20 mg at 06/14/22 [id number]   QUEtiapine (SEROQUEL) tablet 100 mg  100 mg Oral [id number] Merrily Brittle, DO   100 mg at 06/14/22 1202   And   QUEtiapine (SEROQUEL) tablet 200 mg  200 mg Oral QHS Merrily Brittle, DO   200 mg at 06/13/22 1954   traZODone (DESYREL) tablet 50 mg  50 mg Oral QHS PRN Revonda Humphrey, NP  50 mg at 06/13/22 1955    Lab Results:  No results found for this or any previous visit (from the past 71 hour(s)).   Blood Alcohol level:  Lab Results  Component Value Date   ETH <10 06/06/2022   ETH <10 [id number]    Metabolic Disorder Labs: Lab Results  Component Value Date   HGBA1C 5.2 04/25/2022   MPG 103 04/25/2022   MPG 105 04/02/2022   Lab Results  Component Value Date   PROLACTIN 28.5 (H) 08/31/2016   Lab Results  Component Value Date   CHOL 201 (H) 04/02/2022   TRIG 207 (H) 04/02/2022   HDL 56 04/02/2022   CHOLHDL 3.6 04/02/2022   VLDL 41 (H) 04/02/2022   LDLCALC 104 (H) 04/02/2022   LDLCALC 76 08/31/2016    Physical Findings: BP (!) 142/102 (BP Location: Left Arm)   Pulse (!) 141   Temp 98.1 F (36.7 C) (Oral)   Resp 18   Ht '5\' 7"'$  (1.702 m)   Wt 72.6 kg   SpO2 (!) 79%   BMI 25.06 kg/m   Physical Exam Physical Exam Vitals and nursing note reviewed.  Constitutional:      General: He is not in acute distress.    Appearance: He is not ill-appearing, toxic-appearing or diaphoretic.  HENT:     Head: Normocephalic.  Pulmonary:     Effort: Pulmonary effort is normal. No respiratory distress.  Neurological:     Mental Status: He is alert.     AIMS:   ,  ,  ,  ,      Psychiatric Specialty Exam: Presentation  General Appearance: Appropriate for Environment; Casual; Fairly Groomed  Eye Contact: Good  Speech: Clear and Coherent; Normal Rate  Volume: Normal  Handedness:Right   Mood and Affect  Mood: Anxious  Affect:  Appropriate; Congruent; Full Range   Thought Process  Thought Process: Coherent; Goal Directed; Linear  Descriptions of Associations: Intact   Thought Content Suicidal Thoughts:No, denied SI  Homicidal Thoughts:No, denied HI  Hallucinations:denied Ideas of Reference:None  Thought Content:Rumination; Perseveration   Sensorium  Memory:Immediate Good  Judgment:Impaired  Insight:Shallow   Executive Functions  Orientation:Full (Time, Place and Person)  Language:Good  Concentration:Good  Attention:Good  [place] of Knowledge:Good   Psychomotor Activity  Psychomotor Activity:Normal   Assets  Assets:Communication Skills; Desire for Improvement; Resilience   Sleep  Quality:Good  Documented sleep last 24 hours: 6.75   ASSESSMENT/PLAN: Diagnoses / Active Problems: Principal Problem:   MDD (major depressive disorder), recurrent severe, without psychosis ([place]) Active Problems:   MDD (major depressive disorder), recurrent episode, severe ([facility])   Severe benzodiazepine use disorder ([place])   Safety and Monitoring: Voluntary admission to inpatient psychiatric unit for safety, stabilization and treatment   Daily contact with patient to assess and evaluate symptoms and progress in treatment Patient's case to be discussed in multi-disciplinary team meeting Observation Level: q15 minute checks  Vital signs: q12 hours Precautions: suicide, elopement, and assault  2. Psychiatric Diagnoses and Treatment:  MDD v SIMD  IED  GAD Able to control anger much better, no outburst for days now. Anxiety improving, but still symptomatic.  INCREASED wellbutrin 150 mg to 300 mg daily for depression, anxiety One time dose of wellbutrin 150 mg daily for total dose of 300 mg on 2/27 Continued gabapentin 600 mg TID for mood lability - changed PM time to 6PM Continued Seroquel 100 mg qAM + 100 mg qNoon + 200 mg qPM - mood lability  Continued BuSpar 20 mg 3 times daily    3. Medical Issues Being Addressed:  Sinus Tachycardia Varies throughout the day, likely anxiety. EKG HR 101. No CP, SOB, Dizziness.   HTN Normotensive Continued Avapro 75 mg daily medicine, appreciate assistance and recommendation  Mild Hypertriglyceridemia Recommended diet management  Back pain  NSAID use Back pain well controlled with motrin, given constant use, will start ppi as gastritis ppx Continued motrin 400 mg q6hrs  Continued protonix 20 mg daily  4. Routine and other pertinent labs:   BMI:25.05  CMP WNL CBC WNL BAL <10 A1c 5.2 (04/25/2022)  Lipid Panel: Chol 201, triglyceride 207, VLDL 41, LDL 104, otherwise WNL (04/02/2022) TSH 1.700 (04/02/2022)  EKG NSR, Qtc 402  4. Discharge Planning:  Social work and case management to assist with discharge planning and identification of hospital follow-up needs prior to discharge Estimated LOS: 5-7 days Discharge Concerns: Need to establish a safety plan; Medication compliance and effectiveness Discharge Goals: Return home with outpatient referrals for mental health follow-up including medication management/psychotherapy  Dispo: [place], [place] [facility]  Declined by [facility] Date: 06/15/2022  Treatment Plan Summary: Daily contact with patient to assess and evaluate symptoms and progress in treatment and Medication management  Total Time spent with patient: See attending attestation Patient's case was discussed with Attending Ranae Palms, MD.   Signed: Merrily Brittle, DO Psychiatry Resident, PGY-2 [facility] - Adult  [address] [place], [place] [address] Ph: [phone] Fax: [phone] 06/14/2022, 12:22 PM

## 2022-06-14 NOTE — Group Note (Unsigned)
LCSW Group Therapy Note  Group Date: 06/14/2022 Start Time: 1100 End Time: 1300   Type of Therapy and Topic:  Group Therapy - Healthy vs Unhealthy Coping Skills  Participation Level:  {BHH PARTICIPATION LEVEL:22264}   Description of Group The focus of this group was to determine what unhealthy coping techniques typically are used by group members and what healthy coping techniques would be helpful in coping with various problems. Patients were guided in becoming aware of the differences between healthy and unhealthy coping techniques. Patients were asked to identify 2-3 healthy coping skills they would like to learn to use more effectively.  Therapeutic Goals 1. Patients learned that coping is what human beings do all day long to deal with various situations in their lives 2. Patients defined and discussed healthy vs unhealthy coping techniques 3. Patients identified their preferred coping techniques and identified whether these were healthy or unhealthy 4. Patients determined 2-3 healthy coping skills they would like to become more familiar with and use more often. 5. Patients provided support and ideas to each other   Summary of Patient Progress:  During group, *** expressed ***. Patient proved open to input from peers and feedback from CSW. Patient demonstrated *** insight into the subject matter, was respectful of peers, and participated throughout the entire session.   Therapeutic Modalities Cognitive Behavioral Therapy Motivational Interviewing  Chisa Kushner S Sierria Bruney, LCSWA 06/14/2022  1:10 PM   

## 2022-06-14 NOTE — Progress Notes (Signed)
Pt becomes agitated saying that he can not reach his parole office via phone.  RN talked to pt to try and calm patient down.  Pt insisting he get Zyprexa PRN says that "nothing else will calm me down."  RN gave pt Zyprexa per [facility].

## 2022-06-14 NOTE — Group Note (Signed)
LCSW Group Therapy Note  Group Date: 06/14/2022 Start Time: 1100 End Time: 1200   Type of Therapy and Topic:  Group Therapy - Healthy vs Unhealthy Coping Skills  Participation Level:  Active   Description of Group The focus of this group was to determine what unhealthy coping techniques typically are used by group members and what healthy coping techniques would be helpful in coping with various problems. Patients were guided in becoming aware of the differences between healthy and unhealthy coping techniques. Patients were asked to identify 2-3 healthy coping skills they would like to learn to use more effectively.  Therapeutic Goals Patients learned that coping is what human beings do all day long to deal with various situations in their lives Patients defined and discussed healthy vs unhealthy coping techniques Patients identified their preferred coping techniques and identified whether these were healthy or unhealthy Patients determined 2-3 healthy coping skills they would like to become more familiar with and use more often. Patients provided support and ideas to each other   Summary of Patient Progress:  During group, [place] expressed his feelings. Patient proved open to input from peers and feedback from [place]. Patient demonstrated good insight into the subject matter, was respectful of peers, and participated throughout the entire session.   Therapeutic Modalities Cognitive Behavioral Therapy Motivational Interviewing  Windle Guard, LCSW 06/14/2022  1:15 PM

## 2022-06-14 NOTE — Progress Notes (Signed)
Adult Psychoeducational Group Note  Date:  06/14/2022 Time:  9:52 AM  Group Topic/Focus:  Goals Group:   The focus of this group is to help patients establish daily goals to achieve during treatment and discuss how the patient can incorporate goal setting into their daily lives to aide in recovery. Orientation:   The focus of this group is to educate the patient on the purpose and policies of crisis stabilization and provide a format to answer questions about their admission.  The group details unit policies and expectations of patients while admitted.  Participation Level:  Did Not Attend  Participation Quality:    Affect:    Cognitive:    Insight:   Engagement in Group:    Modes of Intervention:    Additional Comments:  Pt did not attend the group.  Beryle Beams 06/14/2022, 9:52 AM

## 2022-06-14 NOTE — Progress Notes (Signed)
   06/13/22 1955  Psych Admission Type (Psych Patients Only)  Admission Status Voluntary  Psychosocial Assessment  Patient Complaints Anxiety;Agitation;Depression;Hopelessness  Eye Contact Fair  Facial Expression Anxious  Affect Appropriate to circumstance  Speech Logical/coherent  Interaction Assertive;Manipulative  Motor Activity Other (Comment)  Appearance/Hygiene Unremarkable  Behavior Characteristics Anxious;Agitated  Mood Threatening;Anxious  Thought Process  Coherency WDL  Content WDL  Delusions None reported or observed  Perception WDL  Hallucination None reported or observed  Judgment Poor  Confusion None  Danger to Self  Current suicidal ideation? Denies  Self-Injurious Behavior No self-injurious ideation or behavior indicators observed or expressed   Agreement Not to Harm Self Yes  Description of Agreement Verbal  Danger to Others  Danger to Others None reported or observed  Danger to Others Abnormal  Harmful Behavior to others No threats or harm toward other people  Destructive Behavior Threats of violence towards property observed or expressed   Description of Destructive Behavior Patient verbally threatened to call a Code if he did not receive his medications immediately.

## 2022-06-14 NOTE — Progress Notes (Signed)
The patient rated his day as a 5 out of 10 since he could not reach his parole officer by phone. He states that he will be discharged tomorrow and is uncertain about his discharge plans. His positive event for the day is that he had a good talk with his family.

## 2022-06-14 NOTE — BHH Counselor (Signed)
BHH/BMU LCSW Progress Note   06/14/2022    3:14 PM  Theodore Carr      Type of Note: Discharge Planning  CSW went to visit patient to follow up about available beds at [facility]; Also, discuss about not being able to get in contact with his Research officer, trade union. Patient said that he has been calling as well and had to leave a voicemails. However, patient said that he does not have a plan B, he was just hoping he can go to [facility] in [place], [place], or [employer]. Patient did say, " if you all send me to the [facility] , that was my main trigger and if I have to go there , you minus well give me something to hang myself out in the parking lot". CSW did inform the doctors of this comment and that at this time their are no other resources for patient since he has been kicked out or does not have income to pay for services such as [employer]. CSW will follow up with supervisor about transportation since patient Theodore Carr is out the office today per the Adult Probation Office main office line .    Signed:   Silas Flood, MSW, [facility] 06/14/2022 3:14 PM

## 2022-06-14 NOTE — BHH Counselor (Signed)
BHH/BMU LCSW Progress Note   06/14/2022    8:23 AM  [name]      Type of Note: Follow Up With BATS   CSW called program and spoke with Mr. Wille Glaser this morning regarding patient application and per Mr. Wille Glaser, " their are 4 people ahead of him , if I don't have an answer by today , I should tomorrow". CSW will continue to assist.     Signed:   Silas Flood, MSW, [facility] 06/14/2022 8:23 AM

## 2022-06-15 ENCOUNTER — Encounter (HOSPITAL_COMMUNITY): Payer: Self-pay | Admitting: Psychiatry

## 2022-06-15 ENCOUNTER — Other Ambulatory Visit (HOSPITAL_COMMUNITY): Payer: Self-pay

## 2022-06-15 ENCOUNTER — Telehealth (HOSPITAL_COMMUNITY): Payer: Self-pay | Admitting: Pharmacy Technician

## 2022-06-15 DIAGNOSIS — F602 Antisocial personality disorder: Secondary | ICD-10-CM | POA: Diagnosis present

## 2022-06-15 HISTORY — DX: Antisocial personality disorder: F60.2

## 2022-06-15 MED ORDER — TRAZODONE HCL 50 MG PO TABS: 50.0000 mg | ORAL_TABLET | Freq: Every evening | ORAL | 0 refills | Status: DC | PRN

## 2022-06-15 MED ORDER — QUETIAPINE FUMARATE 200 MG PO TABS: ORAL_TABLET | ORAL | 0 refills | Status: DC

## 2022-06-15 MED ORDER — IRBESARTAN 150 MG PO TABS: 150.0000 mg | ORAL_TABLET | Freq: Every day | ORAL | 0 refills | Status: DC

## 2022-06-15 MED ORDER — IRBESARTAN 75 MG PO TABS: 75.0000 mg | ORAL_TABLET | Freq: Every day | ORAL | 0 refills | Status: DC

## 2022-06-15 MED ORDER — IRBESARTAN 150 MG PO TABS
150.0000 mg | ORAL_TABLET | Freq: Every day | ORAL | Status: DC
  Filled 2022-06-15 (×2): qty 1

## 2022-06-15 MED ORDER — IRBESARTAN 75 MG PO TABS
75.0000 mg | ORAL_TABLET | Freq: Once | ORAL | Status: AC
  Administered 2022-06-15: 75 mg via ORAL
  Filled 2022-06-15: qty 1

## 2022-06-15 MED ORDER — BUPROPION HCL ER (XL) 300 MG PO TB24: 300.0000 mg | ORAL_TABLET | Freq: Every day | ORAL | 0 refills | Status: DC

## 2022-06-15 MED ORDER — IRBESARTAN 75 MG PO TABS
75.0000 mg | ORAL_TABLET | Freq: Once | ORAL | Status: DC
  Filled 2022-06-15: qty 1

## 2022-06-15 NOTE — BHH Counselor (Signed)
BHH/BMU LCSW Progress Note   06/15/2022    10:10 AM  [name]      Type of Note: Discharge Planning   CSW spoke with patient this morning with doctor and Clinical Social Work Supervisor present to discuss discharge options. Patient said that he could not move without his parole officer who CSW has not been able to contact since Monday to confirm if that was true. However, patient said that [facility] had four beds , he just needed transportation there , CSW and Social Work Librarian, academic did explain to patient that if he was to get a bus ticket who would get him to [facility] in [place]. Patient said, " I need a taxi there , I cannot carry all my things from the bus to there. Patient then started making ideal threats saying that he will not go back to the streets because if he do , he is going to hang himself.  CSW have been explaining to patient that if he does not have any money or the correct Medicaid its resources are limited, not only that, patient has been in numerous residential treatment centers and been kicked out for bizarre behavior or making threats. Also, patient does not want to give grandma number who he said is in [place] to see if he could stay there or get transported from the train or bus to get to [place] , patient adamantly said " No that is not an option " .    Patient finally gave his brother number for CSW and doctor to call and see if he could stay there. CSW called and had to leave a voicemail. CSW will continue to assist.     Signed:   Silas Flood, MSW, [facility] 06/15/2022 10:10 AM

## 2022-06-15 NOTE — Progress Notes (Signed)
Adult Psychoeducational Group Note  Date:  06/15/2022 Time:  10:57 AM  Group Topic/Focus:  Goals Group:   The focus of this group is to help patients establish daily goals to achieve during treatment and discuss how the patient can incorporate goal setting into their daily lives to aide in recovery. Orientation:   The focus of this group is to educate the patient on the purpose and policies of crisis stabilization and provide a format to answer questions about their admission.  The group details unit policies and expectations of patients while admitted.  Participation Level:  Active  Participation Quality:  Appropriate  Affect:  Appropriate  Cognitive:  Appropriate  Insight: Appropriate  Engagement in Group:  Engaged  Modes of Intervention:  Discussion  Additional Comments:  Pt attended the goals/orientation group and remained appropriate and engaged throughout the duration of the group.   Beryle Beams 06/15/2022, 10:57 AM

## 2022-06-15 NOTE — Progress Notes (Signed)
Adult Psychoeducational Group Note  Date:  06/15/2022 Time:  7:12 PM  Group Topic/Focus: Resiliency   Participation Level:  Active  Participation Quality:  Appropriate  Affect:  Appropriate  Cognitive:  Appropriate  Insight: Appropriate  Engagement in Group:  Engaged  Modes of Intervention:  Discussion  Additional Comments:  Pt attended the afternoon group and remained appropriate and engaged throughout the duration of the group.   Beryle Beams 06/15/2022, 7:12 PM

## 2022-06-15 NOTE — Group Note (Signed)
Recreation Therapy Group Note   Group Topic:Team Building  Group Date: 06/15/2022 Start Time: 0945 End Time: 1015 Facilitators: Jocee Kissick-McCall, LRT,CTRS Location: [address] Dayroom   Goal Area(s) Addresses:  Patient will effectively work with peer towards shared goal.  Patient will identify skills used to make activity successful.  Patient will identify how skills used during activity can be used to reach post d/c goals.   Group Description: Straw Bridge. In teams of 3-5, patients were given 15 plastic drinking straws and an equal length of masking tape. Using the materials provided, patients were instructed to build a free standing bridge-like structure to suspend an everyday item (ex: puzzle box) off of the floor or table surface. All materials were required to be used by the team in their design. LRT facilitated post-activity discussion reviewing team process. Patients were encouraged to reflect how the skills used in this activity can be generalized to daily life post discharge.    Affect/Mood: Appropriate   Participation Level: Engaged   Participation Quality: Independent   Behavior: Appropriate   Speech/Thought Process: Focused   Insight: Good   Judgement: Good   Modes of Intervention: STEM Activity   Patient Response to Interventions:  Engaged   Education Outcome:  Acknowledges education and In group clarification offered    Clinical Observations/Individualized Feedback: Pt attended and participated in group session.     Plan: Continue to engage patient in RT group sessions 2-3x/week.   Trea Latner-McCall, LRT,CTRS 06/15/2022 12:31 PM

## 2022-06-15 NOTE — Progress Notes (Signed)
Patient came to desk and told writer he was agitated and needed his Zyprexa again. Writer informed patient it wasn't time to get the Zyprexa again, patient then reported he felt like his blood pressure was increasing, BP 147/102, face red, hands trembling. Patient reported his doctors did not need to come near him again today. Attending physician notified. Redirected pt. Clonidine 0.'1mg'$  given for HTN.

## 2022-06-15 NOTE — Telephone Encounter (Signed)
Patient Advocate Encounter  Prior Authorization for QUEtiapine Fumarate '100MG'$  tablets has been approved.    PA# [id number] Insurance [employer] of [place] Prior Authorization Request Form  Effective dates: 06/15/2022 through 06/16/2023      Lyndel Safe, [place] Patient Advocate Specialist [place] Patient Advocate Team Direct Number: [phone]  Fax: [phone]

## 2022-06-15 NOTE — Telephone Encounter (Signed)
Patient Advocate Encounter   Received notification that prior authorization for QUEtiapine Fumarate '100MG'$  tablets is required.   PA submitted on 06/15/2022 [place] [employer] of [place] Prior Authorization Request Form Status is pending       Lyndel Safe, [place] Patient Advocate Specialist [place] Patient Advocate Team Direct Number: [phone]  Fax: [phone]

## 2022-06-15 NOTE — Progress Notes (Signed)
[facility] MD Progress Note  Date: 06/15/2022 7:12 PM Name: Theodore Carr  DOB: 02/07/1986  MRN:  [id number] Unit: [id number]  CC: active HI/SI  Theodore Carr is a 37 y.o. 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  At [facility] 1/3-03/2023 Total duration of encounter: 9 days   Last 24h: Per RN, agitated and anxious, waiting for medication, received agitation PRNs Documented sleep last 24 hours: 7.75   Subjective:  On assessment today, patient was initially seen in the day room, no acute distress. Patient was anxious, irritable during evaluation.   Patient reported anxiety and stress about going to [place]. Perseverated on "needing to go to [place]" and threatened to harm self if team does not get to [place] or if he is dc'd to the [facility] or "to the streets". Suicide is contingent on discharging to [place] only. He did not endorse suicide prior to telling patient the barrier to getting him to [place] due to lack of safe dispo planning. When asked what his plan would be if he did not get the residential rehab bed when he arrives (first come first serve). Patient stated that he could stay with family there, or find a shelter to stay at until next available bed. He continuously declined team to call family. Later agreed to let team call brother. Stated that his parole officer can likely drive him to [place], however he has not been able to get in contact with her.   Denied side effects to med changes.   Mood: Angry, Anxious, Irritable, Dysphoric Sleep:Good Appetite: Good  Suicidal Thoughts:  No (-)  Denied HI Hallucinations: None  Review of Systems  Constitutional:  Negative for diaphoresis and malaise/fatigue.  Eyes:  Negative for blurred vision and double vision.  Respiratory:  Negative for shortness of breath.   Cardiovascular:  Negative for chest pain and palpitations.  Gastrointestinal:  Negative for abdominal pain, constipation, diarrhea, heartburn, nausea and vomiting.  Musculoskeletal:  Positive for back pain.  Neurological:  Negative for dizziness, tremors, weakness and headaches.    Principal Problem: MDD (major depressive disorder), recurrent severe, without psychosis ([place]) Diagnosis: Principal Problem:   MDD (major depressive disorder), recurrent severe, without psychosis ([place]) Active Problems:   Intermittent explosive disorder   Substance induced mood disorder (HCC)   GAD (generalized anxiety disorder)   PTSD (post-traumatic stress disorder)   Severe benzodiazepine use disorder ([place])   Antisocial personality disorder ([place])  Past Psychiatric History: See H&P Past Medical History:  Past Medical History:  Diagnosis Date   Antisocial personality disorder ([place]) 06/15/2022   Chronic pain syndrome 08/30/2016   Cocaine abuse with cocaine-induced mood disorder ([place]) 07/16/2015   Cocaine use disorder, severe, dependence ([place]) 06/14/2016   Compression fracture of T12 vertebra ([place]) 2008   Depression    Hypertension    Insomnia 04/21/2022   Polysubstance dependence including opioid drug with daily use ([place]) 07/16/2015   Social anxiety disorder 04/21/2022    History reviewed. No pertinent surgical history. Family History:  Family History  Problem Relation Age of Onset   Drug abuse Cousin    Family Psychiatric History: See H&P Social History:  Social History   Substance and Sexual Activity  Alcohol Use Not Currently   Alcohol/week: 2.0 standard drinks of alcohol   Types: 2 Cans of beer per week   Comment: last used 4 days ago in prison     Social  History   Substance and Sexual Activity  Drug Use Not Currently   Frequency: 2.0 times per week   Types: Cocaine   Comment: last used 4 days ago    Social History   Socioeconomic History   Marital status: Single    Spouse name: Not on file   Number of children: Not on file   Years of education: 12   Highest education level: GED or equivalent  Occupational History   Not on file  Tobacco Use   Smoking status: Former    Packs/day: 1.00    Years: 15.00    Total pack years: 15.00    Types: Cigarettes    Quit date: 03/18/2022    Years since quitting: 0.2   Smokeless tobacco: Former   Tobacco comments:    Refused nicotine patch and nicotine gum  Vaping Use   Vaping Use: Never used  Substance and Sexual Activity   Alcohol use: Not Currently    Alcohol/week: 2.0 standard drinks of alcohol    Types: 2 Cans of beer per week    Comment: last used 4 days ago in prison   Drug use: Not Currently    Frequency: 2.0 times per week    Types: Cocaine    Comment: last used 4 days ago   Sexual activity: Yes  Other Topics Concern   Not on file  Social History Narrative   Not on file   Social Determinants of Health   Financial Resource Strain: Not on file  Food Insecurity: No Food Insecurity (06/06/2022)   Hunger Vital Sign    Worried About Running Out of Food in the Last Year: Never true    Ran Out of Food in the Last Year: Never true  Transportation Needs: Unknown (06/06/2022)   [place] - Hydrologist (Medical): Patient refused    Lack of Transportation (Non-Medical): Patient refused  Physical Activity: Not on file  Stress: Not on file  Social Connections: Not on file   Additional Social History:                         Current Medications: Current Facility-Administered Medications  Medication Dose Route Frequency Provider Last Rate Last Admin   alum & mag hydroxide-simeth (MAALOX/MYLANTA) 200-200-20 MG/5ML suspension 30 mL  30 mL Oral  Q4H PRN Revonda Humphrey, NP       buPROPion (WELLBUTRIN XL) 24 hr tablet 300 mg  300 mg Oral Daily Merrily Brittle, DO   300 mg at 06/15/22 [id number]   busPIRone (BUSPAR) tablet 20 mg  20 mg Oral TID Merrily Brittle, DO   20 mg at 06/15/22 1728   cloNIDine (CATAPRES) tablet 0.1 mg  0.1 mg Oral TID PRN Ranae Palms, MD   0.1 mg at 06/15/22 1428   gabapentin (NEURONTIN) capsule 600 mg  600 mg Oral TID Merrily Brittle, DO   600 mg at 06/15/22 1728   hydrOXYzine (ATARAX) tablet 50 mg  50 mg Oral Q6H PRN Merrily Brittle, DO   50 mg at 06/15/22 1727   ibuprofen (ADVIL) tablet 600 mg  600 mg Oral Q6H PRN Dian Situ, MD   600 mg at 06/15/22 1727   [  START ON 06/16/2022] irbesartan (AVAPRO) tablet 150 mg  150 mg Oral Daily Merrily Brittle, DO       magnesium hydroxide (MILK OF MAGNESIA) suspension 30 mL  30 mL Oral Daily PRN Revonda Humphrey, NP       OLANZapine zydis (ZYPREXA) disintegrating tablet 10 mg  10 mg Oral Q8H PRN Merrily Brittle, DO   10 mg at 06/15/22 [id number]   And   ziprasidone (GEODON) injection 20 mg  20 mg Intramuscular PRN Merrily Brittle, DO       pantoprazole (PROTONIX) EC tablet 20 mg  20 mg Oral Aldean Baker, Almyra Free, DO   20 mg at 06/15/22 [id number]   QUEtiapine (SEROQUEL) tablet 100 mg  100 mg Oral [id number] Merrily Brittle, DO   100 mg at 06/15/22 1100   And   QUEtiapine (SEROQUEL) tablet 200 mg  200 mg Oral QHS Merrily Brittle, DO   200 mg at 06/15/22 1903   traZODone (DESYREL) tablet 50 mg  50 mg Oral QHS PRN Revonda Humphrey, NP   50 mg at 06/14/22 2202    Lab Results:  No results found for this or any previous visit (from the past 5 hour(s)).   Blood Alcohol level:  Lab Results  Component Value Date   ETH <10 06/06/2022   ETH <10 [id number]    Metabolic Disorder Labs: Lab Results  Component Value Date   HGBA1C 5.2 04/25/2022   MPG 103 04/25/2022   MPG 105 04/02/2022   Lab Results  Component Value Date   PROLACTIN 28.5 (H) 08/31/2016   Lab Results  Component Value Date    CHOL 201 (H) 04/02/2022   TRIG 207 (H) 04/02/2022   HDL 56 04/02/2022   CHOLHDL 3.6 04/02/2022   VLDL 41 (H) 04/02/2022   LDLCALC 104 (H) 04/02/2022   LDLCALC 76 08/31/2016    Physical Findings: BP 130/86 (BP Location: Left Arm)   Pulse (!) 105   Temp 98.2 F (36.8 C) (Oral)   Resp 19   Ht '5\' 7"'$  (1.702 m)   Wt 72.6 kg   SpO2 100%   BMI 25.06 kg/m   Physical Exam Physical Exam Vitals and nursing note reviewed.  Constitutional:      General: He is not in acute distress.    Appearance: He is not ill-appearing, toxic-appearing or diaphoretic.  HENT:     Head: Normocephalic.  Pulmonary:     Effort: Pulmonary effort is normal. No respiratory distress.  Neurological:     Mental Status: He is alert.     AIMS:   ,  ,  ,  ,      Psychiatric Specialty Exam: Presentation  General Appearance: Appropriate for Environment; Casual; Fairly Groomed  Eye Contact: Good  Speech: Clear and Coherent; Normal Rate  Volume: Normal  Handedness:Right   Mood and Affect  Mood: Angry; Anxious; Irritable; Dysphoric  Affect: Appropriate; Congruent; Full Range   Thought Process  Thought Process: Coherent; Goal Directed; Linear  Descriptions of Associations: Intact   Thought Content Suicidal Thoughts:No, denied SI  Homicidal Thoughts:No, denied HI  Hallucinations:denied Ideas of Reference:None  Thought Content:Rumination; Perseveration   Sensorium  Memory:Immediate Good  Judgment:Impaired  Insight:Shallow   Executive Functions  Orientation:Full (Time, Place and Person)  Language:Good  Concentration:Good  Attention:Good  [place] of Knowledge:Good   Psychomotor Activity  Psychomotor Activity:Normal   Assets  Assets:Communication Skills; Desire for Improvement; Resilience   Sleep  Quality:Good  Documented sleep last 24 hours: 7.75  ASSESSMENT/PLAN: Diagnoses / Active Problems: Principal Problem:   MDD (major depressive disorder),  recurrent severe, without psychosis ([place]) Active Problems:   Intermittent explosive disorder   Substance induced mood disorder (HCC)   GAD (generalized anxiety disorder)   PTSD (post-traumatic stress disorder)   Severe benzodiazepine use disorder (HCC)   Antisocial personality disorder ([place])   Safety and Monitoring: Voluntary admission to inpatient psychiatric unit for safety, stabilization and treatment   Daily contact with patient to assess and evaluate symptoms and progress in treatment Patient's case to be discussed in multi-disciplinary team meeting Observation Level: q15 minute checks  Vital signs: q12 hours Precautions: suicide, elopement, and assault  2. Psychiatric Diagnoses and Treatment:  MDD v SIMD  IED  GAD Able to control anger much better, no outburst for days now. Anxiety improving, but still symptomatic.  Continued wellbutrin 300 mg daily for depression, anxiety  Continued gabapentin 600 mg TID for mood lability - changed PM time to 6PM  Continued Seroquel 100 mg qAM + 100 mg qNoon + 200 mg qPM - mood lability  Continued BuSpar 20 mg 3 times daily    3. Medical Issues Being Addressed:  Sinus Tachycardia Varies throughout the day, likely anxiety. EKG HR 101. No CP, SOB, Dizziness.  HTN INCREASED Avapro 75 mg 150 mg daily medicine, appreciate assistance and recommendation  Mild Hypertriglyceridemia Recommended diet management  Back pain  NSAID use Back pain well controlled with motrin, given constant use, will start ppi as gastritis ppx Continued motrin 400 mg q6hrs  Continued protonix 20 mg daily   4. Routine and other pertinent labs:   BMI:25.05  CMP WNL CBC WNL BAL <10 A1c 5.2 (04/25/2022)  Lipid Panel: Chol 201, triglyceride 207, VLDL 41, LDL 104, otherwise WNL (04/02/2022) TSH 1.700 (04/02/2022)  EKG NSR, Qtc 402  4. Discharge Planning:  Social work and case management to assist with discharge planning and identification of hospital follow-up  needs prior to discharge Estimated LOS: 5-7 days Discharge Concerns: Need to establish a safety plan; Medication compliance and effectiveness Discharge Goals: Return home with outpatient referrals for mental health follow-up including medication management/psychotherapy  Dispo: [place] [place] [facility]  Declined by [facility] Date: 06/16/2022  Treatment Plan Summary: Daily contact with patient to assess and evaluate symptoms and progress in treatment and Medication management  Total Time spent with patient: See attending attestation Patient's case was discussed with Attending Ranae Palms, MD.   Signed: Merrily Brittle, DO Psychiatry Resident, PGY-2 [facility]  [address] [place], [place] [address] Ph: [phone] Fax: [phone] 06/15/2022, 7:12 PM

## 2022-06-15 NOTE — Progress Notes (Signed)
   06/15/22 0559  15 Minute Checks  Location Bedroom  Visual Appearance Calm  Behavior Sleeping  Sleep (Behavioral Health Patients Only)  Calculate sleep? (Click Yes once per 24 hr at 0600 safety check) Yes  Documented sleep last 24 hours 7.75

## 2022-06-15 NOTE — Plan of Care (Signed)
  Problem: Education: Goal: Ability to state activities that reduce stress will improve Outcome: Adequate for Discharge   Problem: Education: Goal: Utilization of techniques to improve thought processes will improve Outcome: Adequate for Discharge Goal: Knowledge of the prescribed therapeutic regimen will improve Outcome: Adequate for Discharge   Problem: Education: Goal: Knowledge of  General Education information/materials will improve Outcome: Adequate for Discharge   Problem: Education: Goal: Ability to make informed decisions regarding treatment will improve Outcome: Adequate for Discharge

## 2022-06-15 NOTE — Progress Notes (Signed)
   06/15/22 2000  Psych Admission Type (Psych Patients Only)  Admission Status Voluntary  Psychosocial Assessment  Patient Complaints Agitation;Anxiety  Eye Contact Fair  Facial Expression Anxious  Affect Angry  Speech Logical/coherent  Interaction Assertive  Motor Activity Slow  Appearance/Hygiene Unremarkable  Behavior Characteristics Agitated  Mood Labile  Thought Process  Coherency WDL  Content WDL  Delusions None reported or observed  Perception WDL  Hallucination None reported or observed  Judgment Impaired  Confusion None  Danger to Self  Current suicidal ideation? Passive (Denies)  Description of Suicide Plan NO  Self-Injurious Behavior No self-injurious ideation or behavior indicators observed or expressed   Agreement Not to Harm Self Yes  Description of Agreement verbal  Danger to Others Abnormal  Harmful Behavior to others Threats of violence towards other people observed or expressed   Destructive Behavior No threats or harm toward property  Description of Harmful Behavior NO   Patient compliant with medications endorsing Passive SI and verbally contracted for safety. Stated he feels HI "towards people and I will not say who" Support and encouragement provided.

## 2022-06-15 NOTE — Plan of Care (Signed)
  Problem: Education: Goal: Utilization of techniques to improve thought processes will improve Outcome: Progressing Goal: Knowledge of the prescribed therapeutic regimen will improve Outcome: Progressing   Problem: Education: Goal: Knowledge of [place] [facility] Education information/materials will improve Outcome: Progressing   Problem: Education: Goal: Ability to make informed decisions regarding treatment will improve Outcome: Progressing
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
## 2022-06-15 NOTE — BHH Counselor (Signed)
BHH/BMU LCSW Progress Note   06/15/2022    3:08 PM  [name]      Type of Note: Follow Up on Discharge Plans  CSW, Clinical Social Work Librarian, academic, and Chief Operating Officer, Ms. Rowe Robert have been communicating throughout the day about patient plans. Parole officer informed us that she could not provide transportation neither since she called [facility] and they could not guarantee that patient would have a bed by the time they got there. However, Parole office stated that she since patient is ready to DC, she will pick him up and take him to a local shelter [facility]) since he is refusing to go and will inform patient once she picks him up the plans. The Clinical Social Work Librarian, academic did ask the Research officer, trade union is she ever known for patient to be harmful to himself and Research officer, trade union said that she has not known for him to do so, other than what threats he said or done to other people, but she has only had him since December. Parole Officer was made aware that the doctors are in a agreement that patient is ready to be DC tomorrow.    CSW went to visit patient and before saying anything, patient said, " I just spoke to the doctor and if my parole officer does not take me to [place] I am going back to [address] until you all find me a place to go. I will do something to myself or say what I need to get back here, tell that doctor to stay away from me". Patient then just walked off without even allowing CSW to say anything.     Signed:   Silas Flood, MSW, [facility] 06/15/2022 3:08 PM

## 2022-06-15 NOTE — B[facility] Suicide Risk Assessment (Signed)
[facility] Discharge Suicide Risk Assessment  Principal Problem: MDD (major depressive disorder), recurrent severe, without psychosis ([place]) Discharge Diagnoses: Principal Problem:   MDD (major depressive disorder), recurrent severe, without psychosis ([place]) Active Problems:   MDD (major depressive disorder), recurrent episode, severe ([place])   Severe benzodiazepine use disorder ([place])   Reason for admission:  Theodore Carr is a 37 y.o. 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  At [facility] 1/3-03/2023  PTA Medications:  Wellbutrin 150 mg daily Gabapentin 600 mg BID   Hospital Course:   During the patient's hospitalization, patient had extensive initial psychiatric evaluation, and follow-up psychiatric evaluations every day.  Psychiatric diagnoses provided upon initial assessment:  MDD (major depressive disorder), recurrent episode, severe ([place])   Severe benzodiazepine use disorder ([place])  Patient's psychiatric medications were adjusted on admission:  Restarted wellbutrin 150 mg daily for depression Increased gabapentin 600 mg BID to TID for mood lability  During the hospitalization, other adjustments were made to the patient's psychiatric medication regimen:  INCREASED wellbutrin 300 mg daily for depression, anxiety Continued gabapentin 600 mg TID for mood lability - changed PM time to 6PM Continued Seroquel 100 mg qAM + 100 mg qNoon + 200 mg qPM - mood lability  Continued BuSpar 20 mg 3 times daily Continued Avapro 75 mg daily medicine, appreciate assistance and recommendation Continued motrin  400 mg q6hrs PRN Continued protonix 20 mg daily   During the hospitalization lab/imaging obtained: BMI:25.05  CMP WNL CBC WNL BAL <10 A1c 5.2 (04/25/2022)  Lipid Panel: Chol 201, triglyceride 207, VLDL 41, LDL 104, otherwise WNL (04/02/2022) TSH 1.700 (04/02/2022)  EKG NSR, Qtc 402  Patient's care was discussed during the interdisciplinary team meeting every day during the hospitalization.  Patient's side effects to prescribed psychiatric medications: denied  Assessment  Gradually, patient started adjusting to milieu. The patient was evaluated each day by a clinical provider to ascertain response to treatment. Improvement was noted by the patient's report of decreasing symptoms, improved sleep and appetite, affect, medication tolerance, behavior, and participation in unit programming.  Patient was asked each day to complete a self inventory noting mood, mental status, pain, new symptoms, anxiety and concerns.    Symptoms were reported as significantly decreased or resolved completely by discharge.   On day of discharge, the patient reports that their mood is stable. The patient denied having suicidal thoughts for more than 48 hours prior to discharge.  Patient denies having homicidal thoughts.  Patient denies having auditory hallucinations.  Patient denies any visual hallucinations or other symptoms of psychosis. The patient was motivated to continue taking medication with a goal of continued improvement in mental health.   The patient reports their target psychiatric symptoms of anger, irritability, anxiety responded well to the psychiatric medications, and the patient reports overall benefit other psychiatric hospitalization. Supportive psychotherapy was provided to the patient. The patient also participated in regular group therapy while hospitalized. Coping skills, problem solving as well as relaxation therapies were also part of the unit programming.  Labs were reviewed with the  patient, and abnormal results were discussed with the patient.  The patient is able  to verbalize their individual safety plan to this provider.  Behavioral Events: multiple verbal outbursts and threats to staff if did not get his way, only few agitation PRNs required.  Restraints: NA  Groups:Attended most, engaged    Medication List    START taking these medications    busPIRone 10 MG tablet; Commonly known as: BUSPAR; Take 2 tablets (20 mg  total) by mouth 3 (three) times daily.  hydrOXYzine 50 MG tablet; Commonly known as: ATARAX; Take 1 tablet (50  mg total) by mouth every 6 (six) hours as needed for anxiety.  irbesartan 75 MG tablet; Commonly known as: AVAPRO; Take 1 tablet (75 mg  total) by mouth daily.  pantoprazole 20 MG tablet; Commonly known as: PROTONIX; Take 1 tablet  (20 mg total) by mouth every morning.  * QUEtiapine 200 MG tablet; Commonly known as: SEROQUEL; Take 1 tablet  (200 mg total) by mouth at bedtime.  * QUEtiapine 100 MG tablet; Commonly known as: SEROQUEL; Take 1 tablet  (100 mg total) by mouth 2 (two) times daily at 7 am and 12 noon.  traZODone 50 MG tablet; Commonly known as: DESYREL; Take 1 tablet (50 mg  total) by mouth at bedtime as needed for sleep. * This list has 2 medication(s) that are the same as other medications  prescribed for you. Read the directions carefully, and ask your doctor or  other care provider to review them with you.   CHANGE how you take these medications    buPROPion 300 MG 24 hr tablet; Commonly known as: WELLBUTRIN XL; Take 1  tablet (300 mg total) by mouth daily.; What changed: medication strength,  how much to take, Another medication with the same name was removed.  Continue taking this medication, and follow the directions you see here.  gabapentin 600 MG tablet; Commonly known as: Neurontin; Take 1 tablet  (600 mg total) by mouth 3 (three) times daily.; What changed: when to take  this, Another medication with the same  name was removed. Continue taking  this medication, and follow the directions you see here.     Musculoskeletal: Strength & Muscle Tone: within normal limits Gait & Station: normal Patient leans: N/A   Presentation  General Appearance:Appropriate for Environment, Casual, Fairly Groomed Eye Contact:Good Speech:Clear and Coherent, Normal Rate Volume:Normal Handedness:Right  Mood and Affect  Mood:Anxious Affect:Appropriate, Congruent, Full Range  Thought Process  Thought Process:Coherent, Goal Directed, Linear Descriptions of Associations:Intact  Thought Content Suicidal Thoughts:Suicidal Thoughts: No (-) Homicidal Thoughts:Homicidal Thoughts: No (-) HI Passive Intent and/or Plan: Without Intent Hallucinations:Hallucinations: None Ideas of Reference:None Thought Content:Rumination, Perseveration  Sensorium  Memory:Immediate Good Judgment:Impaired Insight:Shallow  Executive Functions  Orientation:Full (Time, Place and Person) Language:Good Concentration:Good Attention:Good Recall:Good Fund of Knowledge:Good  Psychomotor Activity  Psychomotor Activity:Psychomotor Activity: Normal  Assets  Assets:Communication Skills, Desire for Improvement, Resilience  Sleep  Quality:Good  Physical Exam Vitals and nursing note reviewed.  Constitutional:      General: He is not in acute distress.    Appearance: He is not ill-appearing, toxic-appearing or diaphoretic.  HENT:     Head: Normocephalic.  Pulmonary:     Effort: Pulmonary effort is normal. No respiratory distress.  Neurological:     Mental Status: He is alert.     Review of Systems  Respiratory:  Negative for shortness of breath.   Cardiovascular:  Negative for chest pain.  Gastrointestinal:  Negative for nausea and vomiting.  Neurological:  Negative for dizziness and headaches.  Blood pressure (!) 130/96, pulse (!) 114, temperature 97.8 F (36.6 C), temperature source Oral, resp. rate 18, height '5\' 7"'$   (1.702 m), weight 72.6 kg, SpO2 100 %. Body mass index is 25.06 kg/m.  Mental Status Per Nursing Assessment::   On Admission:  Suicidal ideation indicated by patient  Demographic Factors: Low socioeconomic status, Male, Caucasian   Loss Factors: Decrease in vocational status, Financial problems / change in socioeconomic status   Historical Factors: Prior suicide attempts, Impulsivity   Risk Reduction Factors:   Positive therapeutic relationship  Continued Clinical Symptoms:  Alcohol/Substance Abuse/Dependencies More than one psychiatric diagnosis Previous Psychiatric Diagnoses and Treatments Medical Diagnoses and Treatments/Surgeries  Cognitive Features That Contribute To Risk:  Closed-mindedness, Loss of executive function, and Polarized thinking    Suicide Risk:  Mild: There are no identifiable suicide plans, no associated intent, mild dysphoria and related symptoms, good self-control (both objective and subjective assessment), few other risk factors, and identifiable protective factors, including available and accessible social support     Follow-up Information     [facility] Follow up.   Specialty: Behavioral Health Why: You may go to this provider for an assessment, to obtain therapy and medication management services on Monday through Friday, arrive no later than 7:20 am as services are first come, first served. Contact information: [place] [phone]        Services, [facility] Follow up.   Why: Referral made Contact information: [address] [place] [address] [phone]         [facility]. Go on 06/22/2022.   Why: You have a hospital follow up appointment for therapy and medication management services on 06/22/22 at 12:30 pm.  This appointment will be held in person. Contact information: [address] [place] [address] [phone]                  Discharge recommendations:    # It is recommended to the patient to continue psychiatric medications as prescribed, after discharge from the hospital.     # It is recommended to the patient to follow up with your outpatient psychiatric provider -instructions on appointment date, time, and address (location) are provided to you in discharge paperwork  # Follow-up with outpatient primary care doctor and other specialists -for management of chronic medical disease, including:  HTN, HLD  # Testing: Follow-up with outpatient provider for abnormal lab results:  See above   # It was discussed with the patient, the impact of alcohol, drugs, tobacco have been there overall psychiatric and medical wellbeing, and total abstinence from substance use was recommended to the patient.   # Prescriptions provided or sent directly to preferred pharmacy at discharge. Patient agreeable to plan. Given opportunity to ask questions. Appears to feel comfortable with discharge.    # In the event of worsening symptoms, the patient is instructed to call the crisis hotline, 911 and or go to the nearest ED for appropriate evaluation and treatment of symptoms. To follow-up with primary care provider for other medical issues, concerns and or health care needs  Patient agrees with D/C instructions and plan.   Total Time Spent in Direct Patient Care: See attending attestation.  Signed: Merrily Brittle, DO Psychiatry Resident, PGY-2 [facility] - [facility]  [address] [place], [place] [address] Ph: [phone] Fax: [phone] 06/15/2022, 7:38 AM

## 2022-06-15 NOTE — Progress Notes (Signed)
   06/14/22 2100  Psych Admission Type (Psych Patients Only)  Admission Status Voluntary  Psychosocial Assessment  Patient Complaints Anxiety;Depression  Eye Contact Fair  Facial Expression Anxious  Affect Appropriate to circumstance  Speech Logical/coherent  Interaction Assertive  Motor Activity Unsteady  Appearance/Hygiene Unremarkable  Behavior Characteristics Appropriate to situation;Cooperative  Mood Anxious  Thought Process  Coherency WDL  Content WDL  Delusions None reported or observed  Perception WDL  Hallucination None reported or observed  Judgment Poor  Confusion None  Danger to Self  Current suicidal ideation? Denies  Self-Injurious Behavior No self-injurious ideation or behavior indicators observed or expressed   Agreement Not to Harm Self Yes  Description of Agreement Verbal  Danger to Others  Danger to Others None reported or observed  Danger to Others Abnormal  Harmful Behavior to others No threats or harm toward other people  Destructive Behavior No threats or harm toward property

## 2022-06-16 NOTE — Progress Notes (Signed)
  [facility] Adult Case Management Discharge Plan :  Will you be returning to the same living situation after discharge:  No.Patient will be discharging to the [facility] shelter  At discharge, do you have transportation home?: Yes,  Patient parole will be picking him up at 9:00 AM  Do you have the ability to pay for your medications: Yes,  Patient has [facility] Medicaid [place]   Release of information consent forms completed and in the chart;  Patient's signature needed at discharge.  Patient to Follow up at:  [facility] Follow up.   Specialty: Behavioral Health Why: You may go to this provider for an assessment, to obtain therapy and medication management services on Monday through Friday, arrive no later than 7:20 am as services are first come, first served. Contact information: [place], [place]. Go on 06/22/2022.   Why: You have a hospital follow up appointment for therapy and medication management services on 06/22/22 at 12:30 pm.  This appointment will be held in person. Contact information: [address] [place] [address] [phone]         [facility]. Call.   Why: Please follow up with this provider to check in on your referral that was sent on 06/08/2022. You were number 2 in line before you discharged. Contact information: [address] [place], [place] [address] P: [phone]                Next level of care provider has access to [place] and Suicide Prevention discussed: Yes,  [place] [facility] [phone] or O- [phone]     Has patient been referred to the Quitline?: N/A patient is not a smoker  Patient has been referred for addiction treatment: N/A. Patient was denied from most appropriate addiction residential treatments he qualified for due to wrong county Medicaid,  no bed availability, or prior behavior.   Sherre Lain, LCSWA 06/16/2022, 8:03 AM

## 2022-06-16 NOTE — Progress Notes (Signed)
Patient discharged from [facility] on 06/16/22 at 0905. Patient denies SI, plan, and intention. Suicide safety plan completed, reviewed with this RN, given to the patient, and a copy in the chart. Patient denies HI/AVH upon discharge. Patient is alert, oriented, and cooperative. RN provided patient with discharge paperwork and reviewed information with patient. Patient expressed that he understood all of the discharge instructions. Pt was satisfied with belongings returned to him from the locker and at bedside. Discharged patient to [facility] waiting room. Pt's parole officers awaiting patient in the [facility] waiting room.

## 2022-06-16 NOTE — [facility] Suicide Risk Assessment (Signed)
[facility] Discharge Suicide Risk Assessment  Principal Problem: MDD (major depressive disorder), recurrent severe, without psychosis ([place]) Discharge Diagnoses: Principal Problem:   MDD (major depressive disorder), recurrent severe, without psychosis ([place]) Active Problems:   Intermittent explosive disorder   Substance induced mood disorder ([facility])   GAD (generalized anxiety disorder)   PTSD (post-traumatic stress disorder)   Severe benzodiazepine use disorder ([place])   Antisocial personality disorder ([place])   Reason for admission:  Theodore Carr is a 37 y.o. 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  At [facility] 1/3-03/2023  PTA Medications:  Wellbutrin 150 mg daily Gabapentin 600 mg BID   Hospital Course:   During the patient's hospitalization, patient had extensive initial psychiatric evaluation, and follow-up psychiatric evaluations every day.  Psychiatric diagnoses provided upon initial assessment:  MDD (major depressive disorder), recurrent episode, severe ([place])   Severe benzodiazepine use disorder ([place])  Patient's psychiatric medications were adjusted on admission:  Restarted wellbutrin 150 mg daily for depression Increased gabapentin 600 mg BID to TID for mood lability  During the hospitalization, other adjustments were made to the patient's psychiatric medication regimen:  INCREASED wellbutrin 300 mg daily for depression, anxiety Continued gabapentin 600 mg TID for mood lability - changed PM time to 6PM Continued Seroquel 100 mg qAM + 100 mg qNoon + 200 mg qPM - mood lability  Continued  BuSpar 20 mg 3 times daily Continued Avapro 75 mg daily medicine, appreciate assistance and recommendation Continued motrin 400 mg q6hrs PRN Continued protonix 20 mg daily   During the hospitalization lab/imaging obtained: BMI:25.05  CMP WNL CBC WNL BAL <10 A1c 5.2 (04/25/2022)  Lipid Panel: Chol 201, triglyceride 207, VLDL 41, LDL 104, otherwise WNL (04/02/2022) TSH 1.700 (04/02/2022)  EKG NSR, Qtc 402  Patient's care was discussed during the interdisciplinary team meeting every day during the hospitalization.  Patient's side effects to prescribed psychiatric medications: denied  Assessment  Gradually, patient started adjusting to milieu. The patient was evaluated each day by a clinical provider to ascertain response to treatment. Improvement was noted by the patient's report of decreasing symptoms, improved sleep and appetite, affect, medication tolerance, behavior, and participation in unit programming.  Patient was asked each day to complete a self inventory noting mood, mental status, pain, new symptoms, anxiety and concerns.    Symptoms were reported as significantly decreased or resolved completely by discharge.   On day of discharge, the patient reports that their mood is stable. The patient denied having suicidal thoughts for more than 48 hours prior to discharge.  Patient denies having homicidal thoughts.  Patient denies having auditory hallucinations.  Patient denies any visual hallucinations or other symptoms of psychosis. The patient was motivated to continue taking medication with a goal of continued improvement in mental health.   The patient reports their target psychiatric symptoms of anger, irritability, anxiety responded well to the psychiatric medications, and the patient reports overall benefit other psychiatric hospitalization. Supportive psychotherapy was provided to the patient. The patient also participated in regular group therapy while hospitalized. Coping skills,  problem solving as well as relaxation therapies were also part of the unit programming.  Labs were reviewed with the patient, and abnormal results were discussed with the patient.  The patient is able to verbalize their individual safety plan to this provider.  Behavioral Events: multiple verbal outbursts and threats to staff if did not get his way, only few agitation PRNs required.  Restraints: NA  Groups:Attended most, engaged    Medication List    START taking these medications    busPIRone 10 MG tablet; Commonly known as: BUSPAR; Take 2 tablets (20 mg  total) by mouth 3 (three) times daily.  hydrOXYzine 50 MG tablet; Commonly known as: ATARAX; Take 1 tablet (50  mg total) by mouth every 6 (six) hours as needed for anxiety.  irbesartan 150 MG tablet; Commonly known as: AVAPRO; Take 1 tablet (150  mg total) by mouth daily.  pantoprazole 20 MG tablet; Commonly known as: PROTONIX; Take 1 tablet  (20 mg total) by mouth every morning.  QUEtiapine 200 MG tablet; Commonly known as: SEROQUEL; Take 1/2 tablet  at 7am and 12 pm noon, and 1 whole tablet at bed time  traZODone 50 MG tablet; Commonly known as: DESYREL; Take 1 tablet (50 mg  total) by mouth at bedtime as needed for sleep.   CHANGE how you take these medications    buPROPion 300 MG 24 hr tablet; Commonly known as: WELLBUTRIN XL; Take 1  tablet (300 mg total) by mouth daily.; What changed: medication strength,  how much to take, Another medication with the same name was removed.  Continue taking this medication, and follow the directions you see here.  gabapentin 600 MG tablet; Commonly known as: Neurontin; Take 1 tablet  (600 mg total) by mouth 3 (three) times daily.; What changed: when to take  this, Another medication with the same name was removed. Continue taking  this medication, and follow the directions you see here.     Musculoskeletal: Strength & Muscle Tone: within normal limits Gait & Station: normal Patient  leans: N/A   Presentation  General Appearance:Appropriate for Environment, Casual, Fairly Groomed Eye Contact:Good Speech:Clear and Coherent, Normal Rate Volume:Normal Handedness:Right  Mood and Affect  Mood:Angry, Anxious, Irritable, Dysphoric Affect:Appropriate, Congruent, Full Range  Thought Process  Thought Process:Coherent, Goal Directed, Linear Descriptions of Associations:Intact  Thought Content Suicidal Thoughts:Suicidal Thoughts: No (-) Homicidal Thoughts:Homicidal Thoughts: No (-) HI Passive Intent and/or Plan: Without Intent Hallucinations:Hallucinations: None Ideas of Reference:None Thought Content:Rumination, Perseveration  Sensorium  Memory:Immediate Good Judgment:Impaired Insight:Shallow  Executive Functions  Orientation:Full (Time, Place and Person) Language:Good Concentration:Good Attention:Good Recall:Good Fund of Knowledge:Good  Psychomotor Activity  Psychomotor Activity:Psychomotor Activity: Normal  Assets  Assets:Communication Skills, Desire for Improvement, Resilience  Sleep  Quality:Good  Physical Exam Vitals and nursing note reviewed.  Constitutional:      General: He is not in acute distress.    Appearance: He is not ill-appearing, toxic-appearing or diaphoretic.  HENT:     Head: Normocephalic.  Pulmonary:     Effort: Pulmonary effort is normal. No respiratory distress.  Neurological:     Mental Status: He is alert.     Review of Systems  Respiratory:  Negative for shortness of breath.   Cardiovascular:  Negative for chest pain.  Gastrointestinal:  Negative for nausea and vomiting.  Neurological:  Negative for dizziness and headaches.     Blood pressure (!) 128/95, pulse (!) 105, temperature 97.7 F (36.5 C), temperature source Oral, resp. rate 18, height '5\' 7"'$  (1.702 m), weight 72.6 kg, SpO2 97 %. Body mass index is 25.06 kg/m.  Mental  Status Per Nursing Assessment::   On Admission:  Suicidal ideation indicated by  patient  Demographic Factors: Low socioeconomic status, Male, Caucasian   Loss Factors: Decrease in vocational status, Financial problems / change in socioeconomic status   Historical Factors: Prior suicide attempts, Impulsivity   Risk Reduction Factors:   Positive therapeutic relationship  Continued Clinical Symptoms:  Alcohol/Substance Abuse/Dependencies More than one psychiatric diagnosis Previous Psychiatric Diagnoses and Treatments Medical Diagnoses and Treatments/Surgeries  Cognitive Features That Contribute To Risk:  Closed-mindedness, Loss of executive function, and Polarized thinking    Suicide Risk:  Mild: There are no identifiable suicide plans, no associated intent, mild dysphoria and related symptoms, good self-control (both objective and subjective assessment), few other risk factors, and identifiable protective factors, including available and accessible social support     Follow-up Information     [facility] Follow up.   Specialty: Behavioral Health Why: You may go to this provider for an assessment, to obtain therapy and medication management services on Monday through Friday, arrive no later than 7:20 am as services are first come, first served. Contact information: [place] [phone]        Services, [facility] Follow up.   Why: Referral made Contact information: [address] [place] [address] [phone]         [facility]. Go on 06/22/2022.   Why: You have a hospital follow up appointment for therapy and medication management services on 06/22/22 at 12:30 pm.  This appointment will be held in person. Contact information: [address] [place] [address] [phone]                 Discharge recommendations:    # It is recommended to the patient to continue psychiatric medications as prescribed, after discharge from the  hospital.     # It is recommended to the patient to follow up with your outpatient psychiatric provider -instructions on appointment date, time, and address (location) are provided to you in discharge paperwork  # Follow-up with outpatient primary care doctor and other specialists -for management of chronic medical disease, including:  HTN, HLD  # Testing: Follow-up with outpatient provider for abnormal lab results:  See above   # It was discussed with the patient, the impact of alcohol, drugs, tobacco have been there overall psychiatric and medical wellbeing, and total abstinence from substance use was recommended to the patient.   # Prescriptions provided or sent directly to preferred pharmacy at discharge. Patient agreeable to plan. Given opportunity to ask questions. Appears to feel comfortable with discharge.    # In the event of worsening symptoms, the patient is instructed to call the crisis hotline, 911 and or go to the nearest ED for appropriate evaluation and treatment of symptoms. To follow-up with primary care provider for other medical issues, concerns and or health care needs  Patient agrees with D/C instructions and plan.   Total Time Spent in Direct Patient Care: See attending attestation.  Signed: Merrily Brittle, DO Psychiatry Resident, PGY-2 [facility]  [address] [place], [place] [address] Ph: [phone] Fax: [phone] 06/16/2022, 7:22 AM

## 2022-07-08 ENCOUNTER — Other Ambulatory Visit (HOSPITAL_COMMUNITY): Payer: Self-pay

## 2023-05-11 ENCOUNTER — Ambulatory Visit (HOSPITAL_COMMUNITY): Payer: Medicaid Other | Admitting: Psychiatry

## 2023-09-11 ENCOUNTER — Ambulatory Visit (HOSPITAL_COMMUNITY)
Admission: EM | Admit: 2023-09-11 | Discharge: 2023-09-11 | Disposition: A | Discharge status: Home / Self Care | Payer: MEDICAID

## 2023-09-11 ENCOUNTER — Emergency Department (HOSPITAL_COMMUNITY)
Admission: EM | Admit: 2023-09-11 | Discharge: 2023-09-12 | Disposition: A | Discharge status: Home / Self Care | Payer: MEDICAID | Attending: Emergency Medicine | Admitting: Emergency Medicine

## 2023-09-11 ENCOUNTER — Other Ambulatory Visit: Payer: Self-pay

## 2023-09-11 DIAGNOSIS — F602 Antisocial personality disorder: Secondary | ICD-10-CM | POA: Diagnosis not present

## 2023-09-11 DIAGNOSIS — F6381 Intermittent explosive disorder: Secondary | ICD-10-CM | POA: Diagnosis not present

## 2023-09-11 DIAGNOSIS — F1994 Other psychoactive substance use, unspecified with psychoactive substance-induced mood disorder: Secondary | ICD-10-CM | POA: Diagnosis not present

## 2023-09-11 DIAGNOSIS — I10 Essential (primary) hypertension: Secondary | ICD-10-CM | POA: Diagnosis not present

## 2023-09-11 DIAGNOSIS — Z765 Malingerer [conscious simulation]: Secondary | ICD-10-CM | POA: Diagnosis present

## 2023-09-11 DIAGNOSIS — F332 Major depressive disorder, recurrent severe without psychotic features: Secondary | ICD-10-CM | POA: Diagnosis not present

## 2023-09-11 DIAGNOSIS — T1491XA Suicide attempt, initial encounter: Secondary | ICD-10-CM

## 2023-09-11 LAB — COMPREHENSIVE METABOLIC PANEL WITH GFR
ALT: 63 U/L — ABNORMAL HIGH (ref 0–44)
AST: 70 U/L — ABNORMAL HIGH (ref 15–41)
Albumin: 4.7 g/dL (ref 3.5–5.0)
Alkaline Phosphatase: 86 U/L (ref 38–126)
Anion gap: 12 (ref 5–15)
BUN: 17 mg/dL (ref 6–20)
CO2: 28 mmol/L (ref 22–32)
Calcium: 9.9 mg/dL (ref 8.9–10.3)
Chloride: 97 mmol/L — ABNORMAL LOW (ref 98–111)
Creatinine, Ser: 0.94 mg/dL (ref 0.61–1.24)
GFR, Estimated: >60 mL/min (ref >60)
Glucose, Bld: 83 mg/dL (ref 70–99)
Potassium: 4 mmol/L (ref 3.5–5.1)
Sodium: 137 mmol/L (ref 135–145)
Total Bilirubin: 1 mg/dL (ref 0.0–1.2)
Total Protein: 8.4 g/dL — ABNORMAL HIGH (ref 6.5–8.1)

## 2023-09-11 LAB — ETHANOL: Alcohol, Ethyl (B): <15 mg/dL (ref <15)

## 2023-09-11 LAB — CBC
HCT: 46.9 % (ref 39.0–52.0)
Hemoglobin: 15.3 g/dL (ref 13.0–17.0)
MCH: 29.9 pg (ref 26.0–34.0)
MCHC: 32.6 g/dL (ref 30.0–36.0)
MCV: 91.8 fL (ref 80.0–100.0)
Platelets: 383 10*3/uL (ref 150–400)
RBC: 5.11 MIL/uL (ref 4.22–5.81)
RDW: 13.2 % (ref 11.5–15.5)
WBC: 16.8 10*3/uL — ABNORMAL HIGH (ref 4.0–10.5)
nRBC: 0 % (ref 0.0–0.2)

## 2023-09-11 LAB — RAPID URINE DRUG SCREEN, HOSP PERFORMED
Amphetamines: NOT DETECTED
Barbiturates: NOT DETECTED
Benzodiazepines: POSITIVE — AB
Cocaine: POSITIVE — AB
Opiates: NOT DETECTED
Tetrahydrocannabinol: NOT DETECTED

## 2023-09-11 MED ORDER — OXYMETAZOLINE HCL 0.05 % NA SOLN
1.0000 | Freq: Once | NASAL | Status: DC
  Filled 2023-09-11: qty 15
  Filled 2023-09-11: qty 30

## 2023-09-11 MED ORDER — QUETIAPINE FUMARATE 100 MG PO TABS
100.0000 mg | ORAL_TABLET | Freq: Every day | ORAL | Status: DC
  Administered 2023-09-11: 100 mg via ORAL
  Filled 2023-09-11: qty 1

## 2023-09-11 MED ORDER — BUPROPION HCL ER (XL) 150 MG PO TB24
150.0000 mg | ORAL_TABLET | Freq: Every day | ORAL | Status: DC
  Administered 2023-09-12: 150 mg via ORAL
  Filled 2023-09-11: qty 1

## 2023-09-11 MED ORDER — TRAZODONE HCL 50 MG PO TABS
50.0000 mg | ORAL_TABLET | Freq: Every evening | ORAL | Status: DC | PRN
  Administered 2023-09-11: 50 mg via ORAL
  Filled 2023-09-11: qty 1

## 2023-09-11 MED ORDER — GABAPENTIN 300 MG PO CAPS
300.0000 mg | ORAL_CAPSULE | Freq: Three times a day (TID) | ORAL | Status: DC
  Administered 2023-09-11 – 2023-09-12 (×2): 300 mg via ORAL
  Filled 2023-09-11: qty 3
  Filled 2023-09-11: qty 1

## 2023-09-11 MED ORDER — IBUPROFEN 200 MG PO TABS
600.0000 mg | ORAL_TABLET | Freq: Once | ORAL | Status: AC
  Administered 2023-09-11: 600 mg via ORAL
  Filled 2023-09-11: qty 3

## 2023-09-11 NOTE — Progress Notes (Signed)
   09/11/23 1109  [facility] Triage Screening (Walk-ins at [facility] only)  How Did You Hear About Us ? Family/Friend  What Is the Reason for Your Visit/Call Today? Theodore Carr presents to [facility] escorted by [employer] endorsing SI and HI. PT states, "I have killed someone before and I now feel like I will kill someone if I dont use cocaine or marijuana". Pt also mentions that he has a plan to end his life by hanging himself any chance he gets. Pt states he is taking suboxone as prescribed and takes this daily. Pt endorses SI for several days now. Pt is looking for inpatient at this time of triage and medication to help with his ongoing suicidal thoughts. Pt denies substance use in the past 24 hours, and AVH.  How Long Has This Been Causing You Problems? <Week  Have You Recently Had Any Thoughts About Hurting Yourself? Yes  How long ago did you have thoughts about hurting yourself? today  Are You Planning to Commit Suicide/Harm Yourself At This time? Yes  Have you Recently Had Thoughts About Hurting Someone Marigene Shoulder? Yes  How long ago did you have thoughts of harming others? today  Are You Planning To Harm Someone At This Time? Yes  Explanation: He states he has thoughts of killing/hurting people when he gets the chance to  Physical Abuse Denies  Verbal Abuse Denies  Sexual Abuse Denies  Exploitation of patient/patient's resources Denies  Self-Neglect Denies  Possible abuse reported to: Other (Comment)  Are you currently experiencing any auditory, visual or other hallucinations? No  Have You Used Any Alcohol or Drugs in the Past 24 Hours? No  Do you have any current medical co-morbidities that require immediate attention? No  Clinician description of patient physical appearance/behavior: calm, cooperative  What Do You Feel Would Help You the Most Today? Treatment for Depression or other mood problem  If access to [facility] was not available, would you have sought care in the Emergency Department? No  Determination of  Need Urgent (48 hours)  Options For Referral Inpatient Hospitalization;Medication Management;Intensive Outpatient Therapy  Determination of Need filed? Yes

## 2023-09-11 NOTE — Consult Note (Signed)
 [facility] Psychiatric Consult Initial  Patient Carr: .Theodore Carr  MRN: [id number]  DOB: 1986-03-28  Consult Order details:  Orders (From admission, onward)     Start     Ordered   09/11/23 1450  CONSULT TO CALL ACT TEAM       Ordering Provider: Lind Repine, MD  Provider:  (Not yet assigned)  Question:  Reason for Consult?  Answer:  Psych consult   09/11/23 1449             Mode of Visit: In person    Psychiatry Consult Evaluation  Service Date: Sep 11, 2023 LOS:  LOS: 0 days  Chief Complaint suicide attempt, agitation, Aggressive behavior  Primary Psychiatric Diagnoses  Recurrent Major Depressive disorder, severe without Psychotic features 2.  Suicide attempt 3.  Substance induced Mood disorder 4.Intermittent Explosive Disorder Assessment  Theodore Carr is a 38 y.o. male admitted: Presented to the EDfor 09/11/2023  1:03 PM for agitation, Explosive anger disorder,Aggressive behavior and while in the ER he attempted to strangulate self because he was told he did not need to be admitted in a Psychiatry unit.Theodore Carr He carries the psychiatric diagnoses of MDD, Intermittent Explosive anger disorder, GAD, PTSD, Antisocial Personality Disorder, Polysubstance abuse,  Severe Benzodiazepine use disorder and has a past medical history of  None.   His current presentation of agitation and aggression  is most consistent with Drug use, his inability to stop using illicit substances and under treatment  of Depression. He meets criteria for inpatient Psychiatry hospitalization based on his level of agitation and current suicide attempt by trying to strangulate himself in the ER with Pillow cases. .  Current outpatient psychotropic medications include Gabapentin , Suboxone, Wellbutrin  XL and Seroquel  and historically he has had a unknown response to these medications. He was unknown compliant with medications prior to admission as evidenced by his report. On initial examination, patient was anxious,  angry and asking for his Suboxone.. Please see plan below for detailed recommendations.   Diagnoses:  Active Hospital problems: Principal Problem:   MDD (major depressive disorder), recurrent severe, without psychosis (HCC) Active Problems:   Intermittent explosive disorder   Substance induced mood disorder (HCC)   Antisocial personality disorder (HCC)    Plan   ## Psychiatric Medication Recommendations:  Resume home Medications  ## Medical Decision Making Capacity: Not specifically addressed in this encounter  ## Further Work-up:  -- most recent EKG on 09/11/2023 had QtC of 430 -- Pertinent labwork reviewed earlier this admission includes: UDS, CMP, CBC   ## Disposition:-- We recommend inpatient psychiatric hospitalization when medically cleared. Patient is under voluntary admission status at this time; please IVC if attempts to leave hospital.  ## Behavioral / Environmental: -To minimize splitting of staff, assign one staff person to communicate all information from the team when feasible. or Utilize compassion and acknowledge the patient's experiences while setting clear and realistic expectations for care.    ## Safety and Observation Level:  - Based on my clinical evaluation, I estimate the patient to be at High risk of self harm in the current setting. - At this time, we recommend  routine. This decision is based on my review of the chart including patient's history and current presentation, interview of the patient, mental status examination, and consideration of suicide risk including evaluating suicidal ideation, plan, intent, suicidal or self-harm behaviors, risk factors, and protective factors. This judgment is based on our ability to directly address suicide risk, implement suicide prevention strategies, and develop  a safety plan while the patient is in the clinical setting. Please contact our team if there is a concern that risk level has changed.  CSSR Risk  Category:C-SSRS RISK CATEGORY: High Risk  Suicide Risk Assessment: Patient has following modifiable risk factors for suicide: active suicidal ideation, untreated depression, under treated depression , and recklessness, which we are addressing by recommending inpatient . Patient has following non-modifiable or demographic risk factors for suicide: male gender, history of suicide attempt, history of self harm behavior, and psychiatric hospitalization Patient has the following protective factors against suicide: Access to outpatient mental health care  Thank you for this consult request. Recommendations have been communicated to the primary team.  We will continue to round on patient until he secures a bed at this time.   [name], NP-PMHNP-BC       History of Present Illness  Relevant Aspects of Hospital ED Course:  Admitted on 09/11/2023 for suicide attempt, agitation, Aggressive behavior  Patient is a 38 years old patient was brought in by [facility]  to the ER from [facility] where he was discharged .  Patient became suicidal and did not want to leave.  He stated that he would be better if [facility] takes him.  Earlier he was escorted out of [facility] as he did not want to leave and he became aggressive and agitation.  He also threatened to hang himself with  T-Shirt. IN TCU [place], as soon as patient was seen by EDP and informed that he did not meet criteria for inpatient Psychiatry  admission patient became angry and started strangulating himself with his pillow cases and was stopped by Mental health technician.   Patient was seen by this provider and he was calm and cooperative.  Patient states that he is tired of going in and out of jail and has been in Prison 10 times.  Patient states he want to stop using drugs and take care of his Mental illness.  Patient states he is dealing with dual diagnosis.  Patient was at [facility] two days ago and was discharged as they determined he was Malingering.   Patient has multiple ER visits across [place] for substance abuse and explosive anger issues.  He has had inpatient Psychiatry hospitalizations and he has outpatient Psychiatry providers and is on Suboxone treatment for Opiate addiction.  He last filled his 30 days Suboxone 4 mg twice a day ob the [facility] [address] of May, four days ago.  He did not bring his bottle to the ER and his UDS is negative for opiates but positive for Benzodiazepine and Cocaine.  Patient states he has been taking Suboxone for a while.  Patient may be Malingering but his actions in the last 24 hours makes him a danger to himself.  He is very much in agreement to get into a Psychiatry unit for treatment and provider will not seek IVC at this time.  We will seek bed placement at any facility with available bed.  We will resume home Medications while waiting for bed placement. Psych ROS:  Depression: yes Anxiety:  na Mania (lifetime and current): na Psychosis: (lifetime and current): na  Collateral information:  Contacted-Declined stating he has nobody left as contact  Review of Systems  Constitutional: Negative.   HENT: Negative.    Eyes: Negative.   Respiratory: Negative.    Cardiovascular: Negative.   Gastrointestinal: Negative.   Genitourinary: Negative.   Musculoskeletal: Negative.   Skin: Negative.   Neurological: Negative.  Endo/Heme/Allergies: Negative.   Psychiatric/Behavioral:  Positive for depression and substance abuse.      Psychiatric and Social History  Psychiatric History:  Information collected from Patient/Medical records  Prev Dx/Sx: see above Current Psych Provider: [facility] in [place] for suboxone treatment Home Meds (current): see above Previous Med Trials: unknown Therapy: denies  Prior Psych Hospitalization: yes  Prior Self Harm: yes-OD in 2013 and hanging himself January 2024 Prior Violence: yes-Reports multiple Prison and jail due to assault, drugs and Murder  Family Psych History:  denies, unknown Family Hx suicide: per patient mother committed suicide by OD and she died.  Social History:  Developmental Hx: wnl Educational Hx: GED Occupational Hx: unemployed Armed forces operational officer Hx: na Living Situation: homeless Spiritual Hx: denies Access to weapons/lethal means: denies   Substance History Alcohol: denies  Tobacco: denies Illicit drugs: Cocaine Prescription drug abuse: yes Rehab hx: denies  Exam Findings  Physical Exam:  Vital Signs:  Temp:  [97.9 F (36.6 C)-98 F (36.7 C)] 98 F (36.7 C) (05/26 1314) Pulse Rate:  [84-103] 103 (05/26 1314) Resp:  [16-19] 16 (05/26 1314) BP: (134-141)/(100-102) 134/102 (05/26 1314) SpO2:  [99 %-100 %] 100 % (05/26 1314) Blood pressure (!) 134/102, pulse (!) 103, temperature 98 F (36.7 C), temperature source Oral, resp. rate 16, SpO2 100%. There is no height or weight on file to calculate BMI.  Physical Exam Vitals and nursing note reviewed.  Constitutional:      Appearance: Normal appearance.  HENT:     Nose: Nose normal.  Cardiovascular:     Rate and Rhythm: Normal rate and regular rhythm.  Pulmonary:     Effort: Pulmonary effort is normal.  Musculoskeletal:        General: Normal range of motion.  Skin:    General: Skin is dry.  Neurological:     Mental Status: He is alert and oriented to person, place, and time.  Psychiatric:        Attention and Perception: Attention and perception normal.        Mood and Affect: Mood is depressed. Affect is labile and angry.        Speech: Speech normal.        Behavior: Behavior normal. Behavior is cooperative.        Thought Content: Thought content includes suicidal ideation.        Cognition and Memory: Cognition and memory normal.        Judgment: Judgment is impulsive and inappropriate.     Mental Status Exam: General Appearance: Casual and Neat  Orientation:  Full (Time, Place, and Person)  Memory:  Immediate;   Good Recent;   Good Remote;   Good  Concentration:   Concentration: Good and Attention Span: Good  Recall:  Good  Attention  Good  Eye Contact:  Good  Speech:  Clear and Coherent and Normal Rate  Language:  Good  Volume:  Normal  Mood: "Depressed, angry"  Affect:  Congruent  Thought Process:  Coherent  Thought Content:  Logical  Suicidal Thoughts:  Attempted to strangulate himself in TCU this afternoon because he was told he does not need inpatient Psychiatry hospitalization  Homicidal Thoughts:  No  Judgement:  Poor  Insight:  Fair  Psychomotor Activity:  Normal  Akathisia:  NA  Fund of Knowledge:  Good      Assets:  Communication Skills Desire for Improvement Physical Health  Cognition:  WNL  ADL's:  Intact  AIMS (if indicated):  Other History   These have been pulled in through the EMR, reviewed, and updated if appropriate.  Family History:  The patient's family history includes Drug abuse in his cousin.  Medical History: Past Medical History:  Diagnosis Date  . Antisocial personality disorder (HCC) 06/15/2022  . Chronic pain syndrome 08/30/2016  . Cocaine abuse with cocaine-induced mood disorder (HCC) 07/16/2015  . Cocaine use disorder, severe, dependence (HCC) 06/14/2016  . Compression fracture of T12 vertebra (HCC) 2008  . Depression   . Hypertension   . Insomnia 04/21/2022  . Polysubstance dependence including opioid drug with daily use (HCC) 07/16/2015  . Social anxiety disorder 04/21/2022    Surgical History: No past surgical history on file.   Medications:  No current facility-administered medications for this encounter.  Current Outpatient Medications:  .  AFRIN 12 HOUR 0.05 % nasal spray, Place 1 spray into both nostrils daily., Disp: , Rfl:  .  buPROPion  (WELLBUTRIN  XL) 300 MG 24 hr tablet, Take 1 tablet (300 mg total) by mouth daily., Disp: 30 tablet, Rfl: 0 .  gabapentin  (NEURONTIN ) 600 MG tablet, Take 1 tablet (600 mg total) by mouth 3 (three) times daily. (Patient taking differently: Take  600 mg by mouth 4 (four) times daily.), Disp: 90 tablet, Rfl: 0 .  QUEtiapine  (SEROQUEL ) 100 MG tablet, Take 100 mg by mouth at bedtime., Disp: , Rfl:  .  sodium chloride  (OCEAN) 0.65 % SOLN nasal spray, Place 1 spray into both nostrils as needed for congestion., Disp: , Rfl:  .  Buprenorphine HCl-Naloxone HCl 4-1 MG FILM, Place 1 Film under the tongue in the morning and at bedtime., Disp: , Rfl:  .  irbesartan  (AVAPRO ) 150 MG tablet, Take 1 tablet (150 mg total) by mouth daily., Disp: 30 tablet, Rfl: 0 .  pantoprazole  (PROTONIX ) 20 MG tablet, Take 1 tablet (20 mg total) by mouth every morning., Disp: 30 tablet, Rfl: 0 .  QUEtiapine  (SEROQUEL ) 200 MG tablet, Take 1/2 tablet at 7am and 12 pm noon, and 1 whole tablet at bed time (Patient not taking: Reported on 09/11/2023), Disp: 60 tablet, Rfl: 0 .  traZODone  (DESYREL ) 50 MG tablet, Take 1 tablet (50 mg total) by mouth at bedtime as needed for sleep. (Patient not taking: Reported on 09/11/2023), Disp: 30 tablet, Rfl: 0  Allergies: Allergies  Allergen Reactions  . Citalopram Nausea And Vomiting  . Amoxicillin Other (See Comments)    Reaction:  Unknown; childhood reaction  . Haloperidol And Related Other (See Comments)    Pt states that this medication locks his body up.      Dakwon Wenberg C Amaiyah Nordhoff, NP-PMHNP-BC

## 2023-09-11 NOTE — ED Notes (Addendum)
 I went to room with Dr Leighton Punches when he told pt that the plan was to discharge him.  Dr Leighton Punches explained that he had been evaluated at [facility] and we are part of the same network.  The pt wanted to be sent to [facility]. Pt became upset and mad when Dr Leighton Punches told the pt that he was going to be discharged.  Pt was argumentative with Dr Leighton Punches.    Dr Leighton Punches left the TCU and pt took his sheet off the bed and stood up in bed and tied the end around his neck with one loop and acted like he was trying to throw the other end up to the ceiling to catch it on something.  He said he would just hang himself if he was going to be sent out. Staff and myself went to room and I removed the sheet from pt and took out the blanket from the bed.  Staff went to let NP and Dr Leighton Punches know what happened.

## 2023-09-11 NOTE — ED Notes (Addendum)
 [facility] called [facility] [phone]) to verify that pt receives substance abuse services, specifically suboxone. Per Jamar in treatment services, pt is client of the agency but Jamar was not sure if pt receives suboxone. Records for suboxone treatment are not kept electronically and Cathleen Coach who was not available today will need to check pts paper file. Jamar requested that [facility] call again to tomorrow to speak with Cathleen Coach to verify pts treatment.   Patt Boozer, [address]  09/11/23

## 2023-09-11 NOTE — ED Provider Notes (Signed)
 Behavioral Health Urgent Care Medical Screening Exam  Patient Name: Theodore Carr MRN: [id number] Date of Evaluation: 09/11/23 Chief Complaint:   Diagnosis:  Final diagnoses:  Antisocial personality disorder (HCC)    History of Present illness: Theodore Carr is a 38 y.o. male with a diagnosis of ASPD, polysubstance use, and a history of aggression.  He presented to this facility reporting active suicidal ideations with a plan to overdose on medications or jump in front of a railroad track.  The patient demanded admission to a psychiatric hospital, also reporting that he had communicated threats of murdering his boss and then killing himself.  Upon further questioning, he began retracting and clarified that the statement should not be taken seriously.  During the interview, the patient was notably uncooperative, irritable, and verbally rude. He frequently refused to answer questions and became disrespectful when asked clarifying or follow-up questions, particularly when he perceived them as repetitive. He also made sexually inappropriate remarks which appeared to be provocative rather than symptomatic. When his requests for food and shelter were not met on his terms, he made conditional threats and engaged in a superficial attempt to strangle himself with his shirt, which did not result in injury and was promptly interrupted by staff.   Given his history of ASPD, fluctuating threats, and lack of specificity or commitment to the actions he described, he does not meet criteria for a psychiatric hospitalization at this time.There is no evidence of acute psychosis, sustained suicidal or homicidal intent, or grave disability.   Of note on chart review, there is documented history of similar behavior, with the patient previously stating that he would "say whatever he needs to say to get back into the hospital," and threatening self-harm when seeking shelter or basic resources.  The patient was previously  hospitalized at [facility] in February 2024, where he has documented as having frequent verbal outbursts and threats to staff if he did not get things his way.  There is documentation from 2018 with the following "Patient threatens to hang himself with a sheet if the psychiatrist does anything other that sends him to [facility] for INPT treatment.".    Flowsheet Row ED from 09/11/2023 in [facility] Most recent reading at 09/11/2023 11:13 AM Ad[facility] (Dis[facility] Most recent reading at 06/06/2022  6:41 PM ED from 06/06/2022 in [facility] Most recent reading at 06/06/2022 12:24 PM  C-SSRS RISK CATEGORY High Risk High Risk Low Risk       Psychiatric Specialty Exam  Presentation  General Appearance:Appropriate for Environment  Eye Contact:Good  Speech:Clear and Coherent; Normal Rate  Speech Volume:Normal    Mood and Affect  Mood:Irritable  Affect:Congruent; Full Range   Thought Process  Thought Processes:Linear  Descriptions of Associations:Intact  Orientation:None  Thought Content:Logical  Diagnosis of Schizophrenia or Schizoaffective disorder in past: No data recorded  Hallucinations:None  Ideas of Reference:None  Suicidal Thoughts:-- (Yes, threatens to kill hmself if not given food immediately)  Homicidal Thoughts:-- (reports HI towards boss and later retracts)   Sensorium  Memory:Immediate Fair; Recent Fair; Remote Fair  Judgment:Poor  Insight:Poor   Executive Functions  Concentration:Fair  Attention Span:Poor  Recall:Poor  Fund of Knowledge:Poor  Language:Fair   Psychomotor Activity  Psychomotor Activity:Restlessness   Assets  Assets:Desire for Improvement; Resilience; Communication Skills   Sleep  Sleep:-- (unable to assess)    Physical Exam: Physical Exam Vitals and nursing note reviewed.  Constitutional:  General: He is  not in acute distress.    Appearance: He is not ill-appearing.  HENT:     Head: Normocephalic and atraumatic.  Eyes:     Extraocular Movements: Extraocular movements intact.     Conjunctiva/sclera: Conjunctivae normal.  Pulmonary:     Effort: Pulmonary effort is normal. No respiratory distress.  Musculoskeletal:        General: Normal range of motion.  Skin:    General: Skin is warm and dry.  Neurological:     General: No focal deficit present.    Review of Systems  All other systems reviewed and are negative.  Blood pressure (!) 141/100, pulse 84, temperature 97.9 F (36.6 C), temperature source Oral, resp. rate 19, SpO2 99%. There is no height or weight on file to calculate BMI.   [facility] Discharge Disposition for Follow up and Recommendations: The patient does not appear to have a medical emergency and had to be escorted out of the building by security due escalating behaviors.    Theodore Bonier, MD 09/11/2023, 12:49 PM

## 2023-09-11 NOTE — ED Notes (Signed)
 Pt asleep at this time

## 2023-09-11 NOTE — ED Notes (Incomplete)
 Went to pts room with Dr Leighton Punches and listened while he explained to pt that he would be discharged with resources for further help.  Pt stated that he wanted to go to [facility] and Dr Leighton Punches explained that he had been seen today and [facility] and was evaluated and released because he did not meet criteria for any in-patient care at the time.  Pt did not want to agree with this and complained that he wants to go to [facility]. Dr Leighton Punches explained that he had been evaluated and was released and we would not ha

## 2023-09-11 NOTE — ED Provider Notes (Signed)
 [place] EMERGENCY DEPARTMENT AT [facility] Provider Note   CSN: [id number] Arrival date & time: 09/11/23  1259     History  Chief Complaint  Patient presents with   Suicidal    Ricki Clack is a 38 y.o. male.  HPI Patient is a 38 year old male presenting today with SI after recent being seen by behavioral urgent care and escorted off the property by security due to becoming aggressive and suspected to be malingering as patient has previous history of malingering.  Today states that he was ready to hang himself with a T-shirt and is starting to do so.  medical history of HTN, insomnia, polysubstance tenderness, antisocial personality sorter, chronic pain      Home Medications Prior to Admission medications   Medication Sig Start Date End Date Taking? Authorizing Provider  AFRIN 12 HOUR 0.05 % nasal spray Place 1 spray into both nostrils daily.   Yes [provider]  Buprenorphine HCl-Naloxone HCl 4-1 MG FILM Place 1 Film under the tongue in the morning and at bedtime.   Yes [provider]  buPROPion  (WELLBUTRIN  XL) 300 MG 24 hr tablet Take 1 tablet (300 mg total) by mouth daily. 06/15/22 09/11/23 Yes Nguyen, Julie, DO  gabapentin  (NEURONTIN ) 600 MG tablet Take 1 tablet (600 mg total) by mouth 3 (three) times daily. Patient taking differently: Take 600 mg by mouth 4 (four) times daily. 06/14/22 09/11/23 Yes Nguyen, Julie, DO  ibuprofen  (ADVIL ) 200 MG tablet Take 600 mg by mouth every 6 (six) hours as needed for mild pain (pain score 1-3) or headache.   Yes [provider]  QUEtiapine  (SEROQUEL ) 100 MG tablet Take 100 mg by mouth at bedtime.   Yes [provider]  sodium chloride  (OCEAN) 0.65 % SOLN nasal spray Place 1 spray into both nostrils as needed for congestion.   Yes [provider]  irbesartan  (AVAPRO ) 150 MG tablet Take 1 tablet (150 mg total) by mouth daily. Patient not taking: Reported on 09/11/2023 06/16/22 09/11/23   Nguyen, Julie, DO  Nicotine  (NICODERM CQ  TD) Place 1 patch onto the skin daily as needed (for smoking cessation).    [provider]  pantoprazole  (PROTONIX ) 20 MG tablet Take 1 tablet (20 mg total) by mouth every morning. Patient not taking: Reported on 09/11/2023 06/15/22 09/11/23  Nguyen, Julie, DO  QUEtiapine  (SEROQUEL ) 200 MG tablet Take 1/2 tablet at 7am and 12 pm noon, and 1 whole tablet at bed time Patient not taking: Reported on 09/11/2023 06/15/22   Nguyen, Julie, DO  traZODone  (DESYREL ) 50 MG tablet Take 1 tablet (50 mg total) by mouth at bedtime as needed for sleep. Patient not taking: Reported on 09/11/2023 06/15/22 09/11/23  Nguyen, Julie, DO      Allergies    Citalopram, Other, Amoxicillin, and Haloperidol and related    Review of Systems   Review of Systems  Physical Exam Updated Vital Signs BP (!) 134/102 (BP Location: Left Arm)   Pulse (!) 103   Temp 98 F (36.7 C) (Oral)   Resp 16   SpO2 100%  Physical Exam Constitutional:      General: He is not in acute distress.    Appearance: Normal appearance. He is not ill-appearing or diaphoretic.  Eyes:     Extraocular Movements: Extraocular movements intact.     Conjunctiva/sclera: Conjunctivae normal.  Pulmonary:     Effort: Pulmonary effort is normal. No respiratory distress.  Neurological:     General: No focal deficit present.  Mental Status: He is alert. Mental status is at baseline.  Psychiatric:     Comments: Incredibly aggressive behavior, unable to form physical exam as patient is antagonistic and physically confrontational.  Noted to be in distress, nursing staff reporting that he tried to hang himself with a sheet     ED Results / Procedures / Treatments   Labs (all labs ordered are listed, but only abnormal results are displayed) Labs Reviewed  COMPREHENSIVE METABOLIC PANEL WITH GFR - Abnormal; Notable for the following components:      Result Value   Chloride 97 (*)    Total Protein 8.4 (*)     AST 70 (*)    ALT 63 (*)    All other components within normal limits  CBC - Abnormal; Notable for the following components:   WBC 16.8 (*)    All other components within normal limits  RAPID URINE DRUG SCREEN, HOSP PERFORMED - Abnormal; Notable for the following components:   Cocaine POSITIVE (*)    Benzodiazepines POSITIVE (*)    All other components within normal limits  ETHANOL    EKG EKG Interpretation Date/Time:  Monday Sep 11 2023 13:10:00 EDT Ventricular Rate:  87 PR Interval:  122 QRS Duration:  89 QT Interval:  357 QTC Calculation: 430 R Axis:   66  Text Interpretation: Sinus rhythm Consider left ventricular hypertrophy Since last tracing rate faster Confirmed by Trish Furl [phone]) on 09/11/2023 1:21:29 PM  Radiology No results found.  Procedures Procedures    Medications Ordered in ED Medications - No data to display  ED Course/ Medical Decision Making/ A&P                                 Medical Decision Making Amount and/or Complexity of Data Reviewed Labs: ordered.   This patient is a 38 year old male who presents to the ED for concern of SI.  Has a previous history of antisocial personality disorder and has been noted to have a history of according to behavioral health to "say anything" to get what he wants and to be admitted.  Threatened to hang himself with the patient today.  On exam he was very aggressive, Dr. Leighton Punches, attending saw patient where he was confronted and threatened.  After leaving, nurses said that he attended to hang himself with a sheet.  Patient does not appear to have any medical problems at this time, noting to be mainly in psychiatric distress.  Believe that he would benefit from evaluation from psychiatry.  With patient having tried to attempt hanging self additionally, IVC paperwork was to be filled out by Aneta Keepers, NP according to attending Dr. Leighton Punches who coordinated with practitioner.    Differential diagnoses prior  to evaluation: HI, SI, malingering, psychosis  Past Medical History / Social History / Additional history: Chart reviewed. Pertinent results include: According to behavioral urgent care review and personal review: "Documented history of similar behavior, with the patient previously stating that he would "say whatever he needs to say to get back into the hospital," and threatening self-harm when seeking shelter or basic resources.  The patient was previously hospitalized at [facility] in February 2024, where he has documented as having frequent verbal outbursts and threats to staff if he did not get things his way.  There is documentation from 2018 with the following "Patient threatens to hang himself with a sheet if the psychiatrist does anything other that  sends him to [facility] for INPT treatment."."  Medications / Treatment: Patient IVC by psychiatric staff Aneta Keepers, NP.  Will be seen by psychiatric team, this information was relayed from attending Dr. Leighton Punches.   Disposition: After consideration of the diagnostic results and the patients response to treatment, I feel that patient benefit from admission and evaluation and treatment by psychiatry   Final Clinical Impression(s) / ED Diagnoses Final diagnoses:  Malingering  Antisocial personality disorder [facility])    Rx / DC Orders ED Discharge Orders     None         Vevelyn Gowers 09/11/23 1532    Lind Repine, MD 09/14/23 [phone]

## 2023-09-11 NOTE — ED Triage Notes (Signed)
 Pt arrives with GPD from [facility] where he was just discharged. Pt states he is having SI, would take all his pills. "I can't live like you all do, it would be better if [facility] takes me." Cooperative at this time

## 2023-09-11 NOTE — TOC Initial Note (Signed)
 Transition of Care [facility]) - Initial/Assessment Note    Patient Details  Name: Theodore Carr MRN: [id number] Date of Birth: 1985/11/29  [facility]) CM/SW Contact:    Valley Gavia, LCSWA Phone Number: 09/11/2023, 7:32 PM  Clinical Narrative:                  Inpatient Psychiatric Referral   Patient was recommended inpatient per Arvell Latin, NP . There are no available beds at [name], per [facility] Linsey Strader. Patient was referred to the following out of network facilities:   Service Provider Request Status Services Address Phone Fax Patient Preferred  [facility] Pending - Request Sent -- [address], [place] [address] [phone] --  [facility] Patient Placement Pending - Request Sent -- [facility], [place] [phone] --  [facility] [place] Pending - Request [address] [place] [address] [phone] --  [facility] Pending - Request Sent -- [address] [place] [address] [phone] --  [facility] Pending - Request Sent -- [address], [place] [address] [phone] --  [facility] Pending - Request Sent -- [address] [place], [place] [address] [phone] --  [facility] Pending - Request Sent -- [address] [place], [place] [address] [phone] --  [facility] Pending - Request Sent -- [address] [place] [address] [phone] --  [facility] Pending - Request Sent -- [address] [place] [address] [phone] --  [facility] Pending - Request Sent -- [address], [place] [address] [phone] --  [facility] Pending -  Request Sent -- [address] [place] [address] [phone] --  [facility] Pending - Request Sent -- [address] [place] [address] [phone] --  [facility] Pending - Request Sent -- [address] [place] [address] [phone] --  [facility] Pending - Request Sent -- [address] [place] [address] [phone] --  [facility] Pending - Request Sent -- [place] [phone] --  [facility] Pending - Request Sent -- [address] [place] [address] [phone] --  [facility] Pending - Request Sent -- [address], [place] [address] [phone] --  [facility] Pending - Request Sent -- [address] [place] [address] [phone] --  [facility] Pending - Request Sent -- [address] [place] [phone] --  [facility] Pending - Request Sent -- [address], [place] [address] [phone] --  [facility] Pending - Request Sent -- [address] [place] [address] [phone] --  [facility] Pending - Request Sent -- [address] [place] [address] [phone] --  [facility] Pending - Request Sent -- [address] [place] [address] [phone] --         Patient Goals and CMS Choice            Expected Discharge Plan and Services  Prior Living Arrangements/Services                       Activities of Daily Living      Permission Sought/Granted                  Emotional Assessment               Admission diagnosis:  SI Patient Active Problem List   Diagnosis Date Noted   Antisocial personality disorder (HCC) 06/15/2022   Severe benzodiazepine use disorder (HCC) 06/07/2022   GAD (generalized anxiety disorder) 04/21/2022   PTSD (post-traumatic stress disorder) 04/21/2022   MDD (major depressive disorder), recurrent severe, 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 [place] [address] Phone: [phone] Fax: [phone]     Social Drivers of Health (SDOH) Social History: SDOH Screenings   Food Insecurity: High Risk (10/21/2022)   [facility]  Housing: High Risk (10/21/2022)   [facility]  Transportation Needs:  Recent Concern: Transportation Needs - Unmet Transportation Needs (10/21/2022)   [facility] from [facility]  Utilities: Low Risk  (10/21/2022)   [facility] from [facility]  Alcohol Screen: Medium Risk (06/06/2022)  Depression (PHQ2-9): High Risk (04/02/2022)  Tobacco Use: High Risk (07/14/2023)   Received from [facility]   SDOH Interventions:     Readmission Risk Interventions     No data to display

## 2023-09-11 NOTE — ED Notes (Signed)
 Pt has meds now.  He advised me not to come into or near his room earlier when the was arguing with Dr Leighton Punches.  I did not give pt meds.

## 2023-09-11 NOTE — ED Provider Notes (Signed)
 I provided a substantive portion of the care of this patient.  I personally made/approved the management plan for this patient and take responsibility for the patient management.  EKG Interpretation Date/Time:  Monday Sep 11 2023 13:10:00 EDT Ventricular Rate:  87 PR Interval:  122 QRS Duration:  89 QT Interval:  357 QTC Calculation: 430 R Axis:   66  Text Interpretation: Sinus rhythm Consider left ventricular hypertrophy Since last tracing rate faster Confirmed by Trish Furl [phone]) on 09/11/2023 1:21:29 PM   Patient here complaining of suicidal ideations.  Over contributing patient has history of malingering behavior.  Patient was can be discharged within he wrapped a sheet around his neck.  Patient then threatened to harm me.  Will be seen by psychiatry and will be IVC and they will look for placement   Lind Repine, MD 09/11/23 1448

## 2023-09-11 NOTE — Discharge Instructions (Addendum)
 Outpatient Therapy and Psychiatry Resources for Patients: Your psychiatric needs would be well-served by consultation and regular meetings with an outpatient therapist to assist you with your mood-related conditions. Here are a series of links for finding a therapist.     Includes links to the following: [facility] (http://wilson-mayo.[email or web address]/) (only for [facility] and please reserve for uninsured) [facility] (http://blankenship-martinez.net/) Psychology Today Special educational needs teacher (https://www.psychologytoday.com/us/therapists) Psychology Today Support Group Finder (https://www.psychologytoday.com/us/groups/) [facility] Location (https://carolinabehavioralcare.com/staff-location/[place]/) [facility] [place] - Support Group Finder - (RecordDebt.fi) [facility] [employer] - Lexicographer (https://fspcares.org/contact/) [employer] for Mental Health [place] - [facility] (https://namiguilford.org/support-and-education/support-groups/) Interior and spatial designer Health - Affiliated with [facility] (https://www.[place].com/lb/locations/profile/[facility]-[place]-behavioral-medicine-at-[address]/) Dept of Health and Human Services - Find a mental health facility (http://lester.info/)

## 2023-09-12 ENCOUNTER — Other Ambulatory Visit (HOSPITAL_COMMUNITY): Payer: Self-pay

## 2023-09-12 DIAGNOSIS — F332 Major depressive disorder, recurrent severe without psychotic features: Secondary | ICD-10-CM

## 2023-09-12 MED ORDER — QUETIAPINE FUMARATE 100 MG PO TABS
100.0000 mg | ORAL_TABLET | Freq: Every day | ORAL | 0 refills | Status: AC
  Filled 2023-09-12: qty 30, 30d supply, fill #0

## 2023-09-12 MED ORDER — GABAPENTIN 300 MG PO CAPS
300.0000 mg | ORAL_CAPSULE | Freq: Three times a day (TID) | ORAL | 0 refills | Status: AC
  Filled 2023-09-12: qty 90, 30d supply, fill #0

## 2023-09-12 MED ORDER — HYDROXYZINE HCL 25 MG PO TABS
25.0000 mg | ORAL_TABLET | Freq: Three times a day (TID) | ORAL | Status: DC | PRN
  Administered 2023-09-12: 25 mg via ORAL
  Filled 2023-09-12: qty 1

## 2023-09-12 MED ORDER — HYDROXYZINE HCL 25 MG PO TABS
25.0000 mg | ORAL_TABLET | Freq: Three times a day (TID) | ORAL | 0 refills | Status: AC | PRN
  Filled 2023-09-12: qty 30, 10d supply, fill #0

## 2023-09-12 MED ORDER — IBUPROFEN 200 MG PO TABS: 600.0000 mg | ORAL_TABLET | Freq: Four times a day (QID) | ORAL | Status: DC | PRN

## 2023-09-12 MED ORDER — IBUPROFEN 800 MG PO TABS
800.0000 mg | ORAL_TABLET | Freq: Once | ORAL | Status: AC
  Administered 2023-09-12: 800 mg via ORAL
  Filled 2023-09-12: qty 1

## 2023-09-12 MED ORDER — BUPROPION HCL ER (XL) 150 MG PO TB24
150.0000 mg | ORAL_TABLET | Freq: Every day | ORAL | 0 refills | Status: AC
  Filled 2023-09-12: qty 30, 30d supply, fill #0

## 2023-09-12 NOTE — ED Notes (Signed)
 Patient discharged off unit per provider. Patient alert and no s/s of distress. Discharge information and belongings given to patient. Patient ambulatory off unit, escorted by MHT and security. Patient given bus passes for transportation.
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
## 2023-09-14 ENCOUNTER — Other Ambulatory Visit (HOSPITAL_COMMUNITY): Payer: Self-pay

## 2023-09-25 ENCOUNTER — Other Ambulatory Visit (HOSPITAL_COMMUNITY): Payer: Self-pay
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
## 2024-03-07 NOTE — ED Notes (Signed)
 Patient came back to desk stating he needs his medication, I asked patient which medications he was talking about. Patient stated I need my gabapentin  800mg  and seraquil 800 mg and I know the nurse said I havent taken it in 2 months he stated that is because the dosage was changed.  Advised patient I would reach out to nurse again for him.     Ulanda Lyle Ellen, EMT-P 03/07/24 [phone]

## 2024-03-08 NOTE — ED Provider Notes (Signed)
 Transport arrived at 1800.  He has been accepted to [facility].  He was evaluated at this time.  Stable for transfer.  No complaints.

## 2024-03-08 NOTE — ED Notes (Signed)
 Pt belongings given to [employer]. Pt escorted off of unit by [facility].    Duwaine JAYSON Alar, CNA 03/08/24 1806

## 2024-03-24 ENCOUNTER — Other Ambulatory Visit: Payer: Self-pay

## 2024-03-24 ENCOUNTER — Emergency Department (HOSPITAL_COMMUNITY)
Admission: EM | Admit: 2024-03-24 | Discharge: 2024-03-25 | Disposition: A | Payer: MEDICAID | Attending: Emergency Medicine | Admitting: Emergency Medicine

## 2024-03-24 DIAGNOSIS — R45851 Suicidal ideations: Secondary | ICD-10-CM

## 2024-03-24 DIAGNOSIS — F332 Major depressive disorder, recurrent severe without psychotic features: Secondary | ICD-10-CM

## 2024-03-24 DIAGNOSIS — T1491XA Suicide attempt, initial encounter: Secondary | ICD-10-CM | POA: Diagnosis not present

## 2024-03-24 DIAGNOSIS — F411 Generalized anxiety disorder: Secondary | ICD-10-CM | POA: Diagnosis not present

## 2024-03-24 DIAGNOSIS — F1910 Other psychoactive substance abuse, uncomplicated: Secondary | ICD-10-CM

## 2024-03-24 DIAGNOSIS — F6381 Intermittent explosive disorder: Secondary | ICD-10-CM | POA: Diagnosis not present

## 2024-03-24 DIAGNOSIS — F602 Antisocial personality disorder: Secondary | ICD-10-CM

## 2024-03-24 DIAGNOSIS — F1994 Other psychoactive substance use, unspecified with psychoactive substance-induced mood disorder: Secondary | ICD-10-CM | POA: Diagnosis not present

## 2024-03-24 LAB — URINE DRUG SCREEN
Amphetamines: POSITIVE — AB
Barbiturates: NEGATIVE
Benzodiazepines: POSITIVE — AB
Cocaine: POSITIVE — AB
Fentanyl: NEGATIVE
Methadone Scn, Ur: NEGATIVE
Opiates: NEGATIVE
Tetrahydrocannabinol: POSITIVE — AB

## 2024-03-24 LAB — COMPREHENSIVE METABOLIC PANEL WITH GFR
ALT: 33 U/L (ref 0–44)
AST: 47 U/L — ABNORMAL HIGH (ref 15–41)
Albumin: 4.9 g/dL (ref 3.5–5.0)
Alkaline Phosphatase: 132 U/L — ABNORMAL HIGH (ref 38–126)
Anion gap: 15 (ref 5–15)
BUN: 22 mg/dL — ABNORMAL HIGH (ref 6–20)
CO2: 25 mmol/L (ref 22–32)
Calcium: 10.5 mg/dL — ABNORMAL HIGH (ref 8.9–10.3)
Chloride: 99 mmol/L (ref 98–111)
Creatinine, Ser: 1.05 mg/dL (ref 0.61–1.24)
GFR, Estimated: >60 mL/min (ref >60)
Glucose, Bld: 85 mg/dL (ref 70–99)
Potassium: 4 mmol/L (ref 3.5–5.1)
Sodium: 139 mmol/L (ref 135–145)
Total Bilirubin: 0.4 mg/dL (ref 0.0–1.2)
Total Protein: 8.4 g/dL — ABNORMAL HIGH (ref 6.5–8.1)

## 2024-03-24 LAB — CBC
HCT: 42.4 % (ref 39.0–52.0)
Hemoglobin: 14.3 g/dL (ref 13.0–17.0)
MCH: 30 pg (ref 26.0–34.0)
MCHC: 33.7 g/dL (ref 30.0–36.0)
MCV: 88.9 fL (ref 80.0–100.0)
Platelets: 479 K/uL — ABNORMAL HIGH (ref 150–400)
RBC: 4.77 MIL/uL (ref 4.22–5.81)
RDW: 13.6 % (ref 11.5–15.5)
WBC: 10.8 K/uL — ABNORMAL HIGH (ref 4.0–10.5)
nRBC: 0 % (ref 0.0–0.2)

## 2024-03-24 LAB — SALICYLATE LEVEL: Salicylate Lvl: <7 mg/dL — ABNORMAL LOW (ref 7.0–30.0)

## 2024-03-24 LAB — ACETAMINOPHEN LEVEL: Acetaminophen (Tylenol), Serum: <10 ug/mL — ABNORMAL LOW (ref 10–30)

## 2024-03-24 LAB — ETHANOL: Alcohol, Ethyl (B): <15 mg/dL (ref <15)

## 2024-03-24 MED ORDER — IBUPROFEN 200 MG PO TABS
600.0000 mg | ORAL_TABLET | Freq: Four times a day (QID) | ORAL | Status: DC | PRN
  Administered 2024-03-24 – 2024-03-25 (×2): 600 mg via ORAL
  Filled 2024-03-24 (×2): qty 3

## 2024-03-24 MED ORDER — BUPROPION HCL ER (XL) 150 MG PO TB24
150.0000 mg | ORAL_TABLET | Freq: Every day | ORAL | Status: DC
  Administered 2024-03-24 – 2024-03-25 (×2): 150 mg via ORAL
  Filled 2024-03-24 (×2): qty 1

## 2024-03-24 MED ORDER — CARBAMAZEPINE ER 200 MG PO TB12
300.0000 mg | ORAL_TABLET | Freq: Two times a day (BID) | ORAL | Status: DC
  Administered 2024-03-24 – 2024-03-25 (×2): 300 mg via ORAL
  Filled 2024-03-24 (×2): qty 1

## 2024-03-24 MED ORDER — STERILE WATER FOR INJECTION IJ SOLN
INTRAMUSCULAR | Status: AC
  Filled 2024-03-24: qty 10

## 2024-03-24 MED ORDER — FLUTICASONE PROPIONATE 50 MCG/ACT NA SUSP
1.0000 | Freq: Every day | NASAL | Status: DC
  Filled 2024-03-24: qty 16

## 2024-03-24 MED ORDER — QUETIAPINE FUMARATE 100 MG PO TABS
100.0000 mg | ORAL_TABLET | Freq: Every day | ORAL | Status: DC
  Administered 2024-03-24: 100 mg via ORAL
  Filled 2024-03-24: qty 1

## 2024-03-24 MED ORDER — CARBAMAZEPINE ER 300 MG PO CP12: 300.0000 mg | ORAL_CAPSULE | Freq: Two times a day (BID) | ORAL | Status: DC

## 2024-03-24 MED ORDER — ZIPRASIDONE MESYLATE 20 MG IM SOLR
INTRAMUSCULAR | Status: AC
  Administered 2024-03-24: 20 mg via INTRAMUSCULAR
  Filled 2024-03-24: qty 20

## 2024-03-24 MED ORDER — ACETAMINOPHEN 325 MG PO TABS
650.0000 mg | ORAL_TABLET | Freq: Four times a day (QID) | ORAL | Status: DC | PRN
  Administered 2024-03-25 (×2): 650 mg via ORAL
  Filled 2024-03-24 (×2): qty 2

## 2024-03-24 MED ORDER — GABAPENTIN 400 MG PO CAPS
800.0000 mg | ORAL_CAPSULE | Freq: Four times a day (QID) | ORAL | Status: DC
  Administered 2024-03-24 – 2024-03-25 (×3): 800 mg via ORAL
  Filled 2024-03-24 (×3): qty 2

## 2024-03-24 MED ORDER — GABAPENTIN 300 MG PO CAPS
300.0000 mg | ORAL_CAPSULE | Freq: Three times a day (TID) | ORAL | Status: DC
  Administered 2024-03-24: 300 mg via ORAL
  Filled 2024-03-24: qty 1

## 2024-03-24 MED ORDER — ZIPRASIDONE MESYLATE 20 MG IM SOLR: 20.0000 mg | Freq: Once | INTRAMUSCULAR | Status: AC

## 2024-03-24 NOTE — Progress Notes (Signed)
 Patient has been denied by [facility] due to no appropriate beds available. Patient meets BH inpatient criteria per Cathaleen Adam, PMHNP. Patient has been faxed out to the following facilities:   [facility]  [address] [place]., [place] [address] [phone]  [facility]  [address] [place] [address] [phone]  [facility]  [address] [place], [place] [address] [phone]  [name] Patient Placement  [facility], [place] [phone]  [facility]  [address] [place] [address] [phone]  [facility]  [address] [place] [address] [phone]  [facility]  [address] [place] [address] [phone]  [facility] EFAX  [address] [place], [place] [phone]  [facility]  [address] [place] [address] [phone]  [facility]  [address] [place] [address] [phone]  [facility]  [address] [place], [place] [address] [phone]  [facility]  [address] [place] [address] [phone]  [facility]  [address] [place] [address] [phone]  [facility]  [address] [place] [address] [phone]  [facility]  [address] [place] [address] [phone]  [facility]  [address] [place] [address] [phone]  [facility]  [address], [place]  [place] [address] [phone]  [facility]  [address] [place] [address] [phone]   Bunnie Gallop, MSW, LCSW-A  6:48 PM 03/24/2024

## 2024-03-24 NOTE — Consult Note (Signed)
 [facility] Consult Follow-up  Patient Name: .Theodore Carr  MRN: [id number]  DOB: 08/26/1985  Consult Order details:  Orders (From admission, onward)     Start     Ordered   03/24/24 1502  CONSULT TO CALL ACT TEAM       Ordering Provider: Randol Simmonds, MD  Provider:  (Not yet assigned)  Question:  Reason for Consult?  Answer:  Psych consult   03/24/24 1501   03/24/24 1502  IP CONSULT TO PSYCHIATRY       Ordering Provider: Randol Simmonds, MD  Provider:  (Not yet assigned)  Question:  Reason for consult:  Answer:  Medication management   03/24/24 1501             Mode of Visit: In person    Psychiatry Consult Evaluation  Service Date: March 24, 2024 LOS:  LOS: 0 days  Chief Complaint  suicide attempt, agitation, Aggressive behavior   Primary Psychiatric Diagnoses  Recurrent Major Depressive disorder, severe without Psychotic features Suicide attempt Substance induced Mood disorder  Intermittent Explosive Disorder  Assessment  Theodore Carr is a 38 y.o. male admitted: Presented to the EDfor 03/24/2024 12:04 PM for for agitation, Explosive anger disorder, Aggressive behavior and while in the ER he attempted to strangulate self. He carries the psychiatric diagnoses of MDD, Intermittent Explosive anger disorder, GAD, PTSD, Antisocial Personality Disorder, Polysubstance abuse,  Severe Benzodiazepine use disorder and has a past medical history of  None.  This is a 38 year old male with severe polysubstance use disorder, recent overdose attempt, and active suicidal behavior, including an attempt to hang himself in the ED. He is disorganized, impulsive, depressed, and unsafe. While malingering may be a chronic behavioral pattern, his current actions represent actual imminent danger and require psychiatric hospitalization for stabilization, detoxification support, medication management, and risk containment. Please see plan below for detailed recommendations.   Diagnoses:   Active Hospital problems: Principal Problem:   GAD (generalized anxiety disorder) Active Problems:   MDD (major depressive disorder), recurrent severe, without psychosis (HCC)   Antisocial personality disorder (HCC)    Plan   ## Psychiatric Medication Recommendations:  Continue patient's home medications  ## Medical Decision Making Capacity: Not specifically addressed in this encounter  ## Further Work-up:  -- most recent EKG on 03/24/2024 had QtC of 425 -- Pertinent labwork reviewed earlier this admission includes: UDS, CMP, CBC   ## Disposition:-- We recommend inpatient psychiatric hospitalization after medical hospitalization. Patient has been involuntarily committed on 03/24/2024.   ## Behavioral / Environmental: -Difficult Patient (SELECT OPTIONS FROM BELOW), To minimize splitting of staff, assign one staff person to communicate all information from the team when feasible., or Utilize compassion and acknowledge the patient's experiences while setting clear and realistic expectations for care.    ## Safety and Observation Level:  - Based on my clinical evaluation, I estimate the patient to be at high risk of self harm in the current setting. - At this time, we recommend  1:1 Observation. This decision is based on my review of the chart including patient's history and current presentation, interview of the patient, mental status examination, and consideration of suicide risk including evaluating suicidal ideation, plan, intent, suicidal or self-harm behaviors, risk factors, and protective factors. This judgment is based on our ability to directly address suicide risk, implement suicide prevention strategies, and develop a safety plan while the patient is in the clinical setting. Please contact our team if there is a concern that risk level has  changed.  CSSR Risk Category:C-SSRS RISK CATEGORY: High Risk  Suicide Risk Assessment: Patient has following modifiable risk factors for  suicide: active suicidal ideation, untreated depression, and medication noncompliance, which we are addressing by recommending inpatient psychiatric admission. Patient has following non-modifiable or demographic risk factors for suicide: male gender, history of self harm behavior, and psychiatric hospitalization Patient has the following protective factors against suicide: Cultural, spiritual, or religious beliefs that discourage suicide  Thank you for this consult request. Recommendations have been communicated to the primary team.  We will continue to follow patient at this time.   CATHALEEN ADAM, PMHNP       History of Present Illness  Relevant Aspects of Hospital ED Course:  Admitted on 03/24/2024 for aggression and suicidal behavior.  Patient Report:  Patient is a 38 year old male presenting to the ED for evaluation of a possible overdose and suicidal behavior. He reports heavy methamphetamine and crack cocaine use recently and states he used these substances with the intention of stopping his heart in an attempt to kill himself. He also reports ingesting a "bottle's worth" of gabapentin , unsure of the exact quantity, but states it was an intentional overdose. He is prescribed daily gabapentin .  During interview, the patient is calm and cooperative but rambling, disorganized, and tangential. He states he is "tired of going in and out of jail," reporting he has been incarcerated 10 times. He endorses feeling depressed and suicidal, with worsening life stressors, chaotic substance use, untreated infection, and unstable living circumstances. States he has "a whole lot of shit going on" and that his "mind feels twisted."  He reports severe polysubstance use, including methamphetamine, crack cocaine, THC, and recent benzodiazepine exposure. UDS is positive for benzodiazepines, cocaine, amphetamines, and THC, and negative for opiates despite patient claiming Suboxone use. He did not bring his  Suboxone bottle.  Patient reports he wants to "go to detox," but simultaneously endorses suicidal intent and self-harm behaviors.  Per EMS report, a paramedic entered the room and found the patient: Standing on the bed Holding his belt above his head. Stating he was "looking for somewhere to hook it" Verbalizing: "I'm going to hang myself." This occurred today and represents direct suicidal behavior with intent.  Past History (Chart Review) Multiple ED visits across  for substance abuse, explosive anger, and psychiatric crises Multiple prior inpatient psychiatric hospitalizations Long history of polysubstance use and dual-diagnosis presentations On Suboxone treatment for opioid use disorder (inconsistently) History of chronic homelessness, unstable relationships, and limited supports  Although malingering is a historical consideration, the current presentation involves actual dangerous behavior and credible suicidal intent, which supersedes secondary gain concerns.  High-risk features present: Recent intentional overdose (meth, crack, gabapentin ) Explicit suicidal intent ("stop my heart," "I'm going to hang myself") Suicidal gesture today using belt Severe polysubstance intoxication and withdrawal risk Chaotic, disorganized thinking Homelessness or unstable housing Untreated medical needs (sinus infection, antibiotics not picked up) History of chronic suicidality and behavioral dyscontrol Poor insight and judgment Inability to articulate a safe plan Dual-diagnosis condition with high impulsivity   Collateral and chart indicate possible malingering patterns, but this does NOT negate the real and immediate danger demonstrated by today's overdose and suicidal gesture.  Acute suicide risk is HIGH. Patient is not safe for discharge, nor appropriate for outpatient management or detox-only placement at this time.   Psych ROS:  Depression: yes Anxiety:  yes Mania (lifetime and  current): na Psychosis: (lifetime and current): na    Collateral information:  Contacted Declined  stating he has nobody left as contact   Review of Systems  Psychiatric/Behavioral:  Positive for depression, substance abuse and suicidal ideas.      Psychiatric and Social History  Psychiatric History:  Information collected from Patient/Medical records   Prev Dx/Sx: see above Current Psych Provider: [facility] in [place] for suboxone treatment Home Meds (current): see above Previous Med Trials: unknown Therapy: denies   Prior Psych Hospitalization: yes  Prior Self Harm: yes-OD in 2013 and hanging himself January 2024 Prior Violence: yes-Reports multiple Prison and jail due to assault, drugs and Murder   Family Psych History: denies, unknown Family Hx suicide: per patient mother committed suicide by OD and she died.   Social History:  Developmental Hx: wnl Educational Hx: GED Occupational Hx: unemployed Armed Forces Operational Officer Hx: na Living Situation: homeless Spiritual Hx: denies Access to weapons/lethal means: denies    Substance History Alcohol: denies  Tobacco: denies Illicit drugs: Cocaine Prescription drug abuse: yes Rehab hx: denies Exam Findings  Physical Exam:  Vital Signs:  Temp:  [97.5 F (36.4 C)] 97.5 F (36.4 C) (12/07 1236) Pulse Rate:  [84] 84 (12/07 1230) Resp:  [17] 17 (12/07 1230) BP: (150)/(103) 150/103 (12/07 1230) SpO2:  [100 %] 100 % (12/07 1230) Blood pressure (!) 150/103, pulse 84, temperature (!) 97.5 F (36.4 C), temperature source Oral, resp. rate 17, SpO2 100%. There is no height or weight on file to calculate BMI.  Physical Exam Vitals and nursing note reviewed. Exam conducted with a chaperone present.  Neurological:     Mental Status: He is alert.  Psychiatric:        Attention and Perception: Attention normal.        Mood and Affect: Mood is anxious and depressed. Affect is flat.        Speech: Speech is rapid and pressured.         Behavior: Behavior is cooperative.        Thought Content: Thought content includes suicidal ideation.        Cognition and Memory: Cognition is impaired.        Judgment: Judgment is impulsive.     Mental Status Exam: General Appearance: Disheveled male, appears older than stated age  Orientation:  Full (Time, Place, and Person)  Memory:  Immediate;   Fair  Concentration:  Concentration: Poor and Attention Span: Poor  Recall:  Poor  Attention  Poor  Eye Contact:  Fair  Speech:  Rambling, pressured at times  Language:  Fair  Volume:  Normal  Mood: "Depressed. twisted"  Affect:  Labile, distressed, congruent  Thought Process:  Tangential, disorganized, circumstantial  Thought Content:  Suicidal ideation; hopelessness; recent overdose; suicidal gesture; paranoia minimal  Suicidal Thoughts:  Yes.  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  Family History:  The patient's family history includes Drug abuse in his cousin.  Medical History: Past Medical History:  Diagnosis Date   Antisocial personality disorder (HCC) 06/15/2022   Chronic pain syndrome 08/30/2016   Cocaine abuse with cocaine-induced mood  disorder (HCC) 07/16/2015   Cocaine use disorder, severe, dependence (HCC) 06/14/2016   Compression fracture of T12 vertebra (HCC) 2008   Depression    Hypertension    Insomnia 04/21/2022   Polysubstance dependence including opioid drug with daily use (HCC) 07/16/2015   Social anxiety disorder 04/21/2022    Surgical History: No past surgical history on  file.   Medications:   Current Facility-Administered Medications:    acetaminophen  (TYLENOL ) tablet 650 mg, 650 mg, Oral, Q6H PRN, Randol Simmonds, MD   buPROPion  (WELLBUTRIN  XL) 24 hr tablet 150 mg, 150 mg, Oral, Daily, Randol Simmonds, MD, 150 mg at 03/24/24 1629   carbamazepine  (TEGRETOL  XR) 12 hr tablet 300 mg, 300 mg, Oral, BID, Randol Simmonds, MD   gabapentin  (NEURONTIN ) capsule 800 mg, 800 mg, Oral, QID, Randol Simmonds, MD   ibuprofen  (ADVIL ) tablet 600 mg, 600 mg, Oral, Q6H PRN, Randol Simmonds, MD, 600 mg at 03/24/24 1633   QUEtiapine  (SEROQUEL ) tablet 100 mg, 100 mg, Oral, QHS, Randol Simmonds, MD  Current Outpatient Medications:    Buprenorphine HCl-Naloxone HCl 4-1 MG FILM, Place 1 Film under the tongue in the morning and at bedtime., Disp: , Rfl:    buPROPion  (WELLBUTRIN  XL) 150 MG 24 hr tablet, Take 1 tablet (150 mg total) by mouth daily., Disp: 30 tablet, Rfl: 0   carbamazepine  (CARBATROL ) 300 MG 12 hr capsule, Take 300 mg by mouth 2 (two) times daily., Disp: , Rfl:    cloNIDine  (CATAPRES ) 0.2 MG tablet, Take 0.2 mg by mouth every 8 (eight) hours as needed (for anxiety)., Disp: , Rfl:    doxycycline  (DORYX ) 100 MG EC tablet, Take 100 mg by mouth 2 (two) times daily., Disp: , Rfl:    gabapentin  (NEURONTIN ) 800 MG tablet, Take 800 mg by mouth 4 (four) times daily., Disp: , Rfl:    hydrOXYzine  (ATARAX ) 25 MG tablet, Take 1 tablet (25 mg total) by mouth 3 (three) times daily as needed for anxiety., Disp: 30 tablet, Rfl: 0   Nicotine  (NICODERM CQ  TD), Place 1 patch onto the skin daily as needed (for smoking cessation)., Disp: , Rfl:    QUEtiapine  (SEROQUEL ) 100 MG tablet, Take 1 tablet (100 mg total) by mouth at bedtime., Disp: 30 tablet, Rfl: 0   gabapentin  (NEURONTIN ) 300 MG capsule, Take 1 capsule (300 mg total) by mouth 3 (three) times daily. (Patient not taking: Reported on 03/24/2024), Disp: 90 capsule, Rfl: 0  Allergies: Allergies  Allergen Reactions   Citalopram Nausea And Vomiting   Other Other  (See Comments)    Skin is sensitive- noted   Amoxicillin Other (See Comments)    Unnamed childhood reaction   Haloperidol And Related Other (See Comments)    Pt states that this medication locks up his body    Khalfani Weideman MOTLEY-MANGRUM, PMHNP

## 2024-03-24 NOTE — ED Notes (Addendum)
 As well as the belt patient had his long sleeved shirt tightly tied around his neck stating I can't breathe. So I quickly removed the shirt from his neck. Security is standing outside of patients room keeping watch per [employer] Nurse ILI

## 2024-03-24 NOTE — ED Notes (Signed)
 PT given sand which and ginger ale

## 2024-03-24 NOTE — ED Notes (Signed)
 Patient has been repositioned  with restraints.

## 2024-03-24 NOTE — ED Notes (Signed)
 Patient went to the restroom left the door open. I heard patient fumbling around with a trash bag and notice he was trying to pull it over his head.  Security was outside the door I was able to redirect the patient to his room. RN and Educational Psychologist is aware.

## 2024-03-24 NOTE — ED Notes (Signed)
 Patient is in 4 point restraints.

## 2024-03-24 NOTE — ED Triage Notes (Signed)
 [place]. Pt reports smoking meth and crack last night to 'burst his heart'. Supposedly took 96000mg  gabapentin  at 0530, pt is prescribed 800mg  4x/day. Poison control contacted and said to watch for cns depression, bradycardia but that pt is outside the window for effects to happen. DC from [facility] 2 days ago.  140/104 Hr 78 Cbg 81

## 2024-03-24 NOTE — ED Notes (Signed)
 Patient has stated to me 3 times that he is going to hang himself and that he wants to speak with psychiatry.

## 2024-03-24 NOTE — ED Notes (Signed)
 Pt also states he has had bad sinus pressure for a few weeks, was rx abx but did not get them

## 2024-03-24 NOTE — ED Provider Notes (Addendum)
 [employer] EMERGENCY DEPARTMENT AT [facility] Provider Note   CSN: [id number] Arrival date & time: 03/24/24  1202     Patient presents with: Ingestion   Theodore Carr is a 38 y.o. male.    Ingestion     Patient presents ED for evaluation of possible drug overdose.  Patient states he has been using methamphetamine and crack recently.  He was trying to commit suicide by using those drugs in order to reverse to his heart.  Patient states he then took an overdose of his gabapentin .  Patient states he took a bottle's worth.  He is not sure how much was in it.  He has been prescribed to take that daily.  Patient states he took those medications at about 5:30 in the morning.  Patient states he is feeling depressed and suicidal.  He wants to talk to psychiatry  Prior to Admission medications   Medication Sig Start Date End Date Taking? Authorizing Provider  AFRIN 12 HOUR 0.05 % nasal spray Place 1 spray into both nostrils daily.    [provider]  Buprenorphine HCl-Naloxone HCl 4-1 MG FILM Place 1 Film under the tongue in the morning and at bedtime.    [provider]  buPROPion  (WELLBUTRIN  XL) 150 MG 24 hr tablet Take 1 tablet (150 mg total) by mouth daily. 09/13/23 10/13/23  Onuoha, Josephine C, NP  gabapentin  (NEURONTIN ) 300 MG capsule Take 1 capsule (300 mg total) by mouth 3 (three) times daily. 09/12/23 10/12/23  Onuoha, Josephine C, NP  gabapentin  (NEURONTIN ) 800 MG tablet Take 800 mg by mouth.    [provider]  hydrOXYzine  (ATARAX ) 25 MG tablet Take 1 tablet (25 mg total) by mouth 3 (three) times daily as needed for anxiety. 09/12/23   Onuoha, Josephine C, NP  ibuprofen  (ADVIL ) 200 MG tablet Take 600 mg by mouth every 6 (six) hours as needed for mild pain (pain score 1-3) or headache.    [provider]  Nicotine  (NICODERM CQ  TD) Place 1 patch onto the skin daily as needed (for smoking cessation).    [provider]  QUEtiapine   (SEROQUEL ) 100 MG tablet Take 1 tablet (100 mg total) by mouth at bedtime. 09/12/23 10/12/23  Onuoha, Josephine C, NP  sodium chloride  (OCEAN) 0.65 % SOLN nasal spray Place 1 spray into both nostrils as needed for congestion.    [provider]  traZODone  (DESYREL ) 50 MG tablet Take 1 tablet (50 mg total) by mouth at bedtime as needed for sleep. Patient not taking: Reported on 09/11/2023 06/15/22 09/11/23  Nguyen, Julie, DO    Allergies: Citalopram, Other, Amoxicillin, and Haloperidol and related    Review of Systems  Updated Vital Signs BP (!) 150/103   Pulse 84   Temp (!) 97.5 F (36.4 C) (Oral)   Resp 17   SpO2 100%   Physical Exam Vitals and nursing note reviewed.  Constitutional:      Appearance: He is well-developed. He is not diaphoretic.  HENT:     Head: Normocephalic and atraumatic.     Right Ear: External ear normal.     Left Ear: External ear normal.  Eyes:     General: No scleral icterus.       Right eye: No discharge.        Left eye: No discharge.     Conjunctiva/sclera: Conjunctivae normal.  Neck:     Trachea: No tracheal deviation.  Cardiovascular:     Rate and Rhythm: Normal rate  and regular rhythm.  Pulmonary:     Effort: Pulmonary effort is normal. No respiratory distress.     Breath sounds: Normal breath sounds. No stridor. No wheezing or rales.  Abdominal:     General: Bowel sounds are normal. There is no distension.     Palpations: Abdomen is soft.     Tenderness: There is no abdominal tenderness. There is no guarding or rebound.  Musculoskeletal:        General: No tenderness or deformity.     Cervical back: Neck supple.  Skin:    General: Skin is warm and dry.     Findings: No rash.  Neurological:     General: No focal deficit present.     Mental Status: He is alert.     Cranial Nerves: No cranial nerve deficit, dysarthria or facial asymmetry.     Sensory: No sensory deficit.     Motor: No abnormal muscle tone or seizure activity.      Coordination: Coordination normal.  Psychiatric:        Mood and Affect: Mood is depressed.        Thought Content: Thought content includes suicidal ideation.     (all labs ordered are listed, but only abnormal results are displayed) Labs Reviewed  CBC - Abnormal; Notable for the following components:      Result Value   WBC 10.8 (*)    Platelets 479 (*)    All other components within normal limits  URINE DRUG SCREEN - Abnormal; Notable for the following components:   Cocaine POSITIVE (*)    Benzodiazepines POSITIVE (*)    Amphetamines POSITIVE (*)    Tetrahydrocannabinol POSITIVE (*)    All other components within normal limits  ACETAMINOPHEN  LEVEL - Abnormal; Notable for the following components:   Acetaminophen  (Tylenol ), Serum <10 (*)    All other components within normal limits  SALICYLATE LEVEL - Abnormal; Notable for the following components:   Salicylate Lvl <7.0 (*)    All other components within normal limits  COMPREHENSIVE METABOLIC PANEL WITH GFR - Abnormal; Notable for the following components:   BUN 22 (*)    Calcium 10.5 (*)    Total Protein 8.4 (*)    AST 47 (*)    Alkaline Phosphatase 132 (*)    All other components within normal limits  ETHANOL    EKG: EKG Interpretation Date/Time:  Sunday March 24 2024 12:28:01 EST Ventricular Rate:  77 PR Interval:  107 QRS Duration:  86 QT Interval:  375 QTC Calculation: 425 R Axis:   51  Text Interpretation: Sinus rhythm Short PR interval Borderline T wave abnormalities , new since last tracing Confirmed by Randol Simmonds [phone]) on 03/24/2024 2:40:15 PM  Radiology: No results found.   Procedures   Medications Ordered in the ED  buPROPion  (WELLBUTRIN  XL) 24 hr tablet 150 mg (has no administration in time range)  gabapentin  (NEURONTIN ) capsule 300 mg (has no administration in time range)  QUEtiapine  (SEROQUEL ) tablet 100 mg (has no administration in time range)    Clinical Course as of 03/24/24 1514   Sun Mar 24, 2024  1420 cbc(!) CBC normal.  Ethanol level normal.  Acetaminophen  and salicylate level negative [JK]  1459 Rapid urine drug screen (hospital performed)(!) UDS positive for multiple substances, cocaine benzos amphetamines and THC [JK]  1459 Patient remained stable.  Medically cleared at this time for psychiatric evaluation [JK]    Clinical Course User Index [JK] Randol Simmonds, MD  Medical Decision Making Amount and/or Complexity of Data Reviewed Labs: ordered. Decision-making details documented in ED Course.  Risk OTC drugs. Prescription drug management.  Patient on arrival was found to have his longsleeve shirt tied around his neck.  This was removed.  Patient redirected.    Patient started to become less cooperative.  Started wandering in the hallways.  Concerned about his safety and noncompliance with his care will place him under PCU.  Patient this time however is medically cleared for psychiatric evaluation.  The patient has been placed in psychiatric observation due to the need to provide a safe environment for the patient while obtaining psychiatric consultation and evaluation, as well as ongoing medical and medication management to treat the patient's condition.  The patient has been placed under full IVC at this time.   5:56 PM patient became violent and aggressive.  He was threatening staff.  Restraints and Geodon  ordered     Final diagnoses:  Suicidal ideation  Polysubstance abuse [facility])    ED Discharge Orders     None          Randol Simmonds, MD 03/24/24 1514    Randol Simmonds, MD 03/24/24 1756

## 2024-03-24 NOTE — ED Notes (Signed)
 Patient is currently upset and yelling at staff. Security is at bedside.

## 2024-03-24 NOTE — ED Notes (Signed)
 I walked into the patient's room to find him standing on the bed, holding his belt above his head. He said he was looking for somewhere to hook it-states Im going to hang myselfI was able to convince him to give me the belt and he sat back down on the bed. The belt was removed from his room as well as all wires from the monitor. Charge RN was notified and security was called. All belongings were removed from the room and he was changed into burgundy scrubs

## 2024-03-24 NOTE — ED Notes (Signed)
 Patient hit staff button. Ed staff came to assist me after I ask the patient to not hit the button several time.

## 2024-03-24 NOTE — ED Notes (Signed)
 Assumed care of patient. Patient was asleep in bed. Restraints were removed due to patient being asleep. Patient woke up briefly for me to introduce myself and let him know I was removing the restraints. Iv was also removed. Sitter is at bedside.

## 2024-03-24 NOTE — ED Notes (Signed)
 Charge nurse Wanita made aware and she stated she will look into him getting a comptroller.

## 2024-03-25 MED ORDER — HYDROXYZINE HCL 25 MG PO TABS
25.0000 mg | ORAL_TABLET | ORAL | Status: DC | PRN
  Administered 2024-03-25 (×2): 25 mg via ORAL
  Filled 2024-03-25 (×2): qty 1

## 2024-03-25 NOTE — ED Notes (Signed)
 Patient report was given to Devere Sharps, RN at [facility]

## 2024-03-25 NOTE — ED Notes (Signed)
 This clinical research associate found patient holding his chapstick.   Patients label attached to chapstick.   RN made aware.   RN given chapstick to put with patients belongings.

## 2024-03-25 NOTE — ED Provider Notes (Signed)
 Emergency Medicine Observation Re-evaluation Note  Theodore Carr is a 38 y.o. male, seen on rounds today.  Pt initially presented to the ED for complaints of Ingestion Currently, the patient is in his room, waiting for breakfast.  Physical Exam  BP (!) 121/93 (BP Location: Right Arm)   Pulse 82   Temp 97.9 F (36.6 C) (Oral)   Resp 18   SpO2 99%  Physical Exam General: Awake and alert Cardiac: Normal rate Lungs: Normal effort Psych: Mood is appropriate  ED Course / MDM  EKG:EKG Interpretation Date/Time:  Sunday March 24 2024 12:28:01 EST Ventricular Rate:  77 PR Interval:  107 QRS Duration:  86 QT Interval:  375 QTC Calculation: 425 R Axis:   51  Text Interpretation: Sinus rhythm Short PR interval Borderline T wave abnormalities , new since last tracing Confirmed by Randol Simmonds [phone]) on 03/24/2024 2:40:15 PM  I have reviewed the labs performed to date as well as medications administered while in observation.  Recent changes in the last 24 hours include evaluation by psychiatry who are recommending inpatient placement.  Patient had to be redirected a few times last evening, however is calm and cooperative this morning.  He has not required restraints since last night..  Plan  Current plan is for inpatient placement.    Mannie Pac T, DO 03/25/24 [phone]

## 2024-03-25 NOTE — ED Notes (Signed)
 Patient given sandwich and apple sauce (and a spoon).

## 2024-03-25 NOTE — Progress Notes (Signed)
 Pt has been accepted to [facility] TODAY 03/25/2024  Bed assignment: Main campus  Pt meets inpatient criteria per: Jadeka Mangrum NP  Attending Physician will be Millie Manners, MD  Report can be called to: [phone] (this is a pager, please leave call-back number when giving report)  Pt can arrive ASAP  Care Team Notified:  [facility] NP, Chesley Holt [facility], Colonel Moose RN   [place] Ahriana Gunkel LCSW-A   03/25/2024 9:49 AM
